# Patient Record
Sex: Female | Born: 1970 | Race: Black or African American | Hispanic: No | Marital: Single | Smoking: Never smoker
Health system: Southern US, Community
[De-identification: ages and names within clinical notes are randomized; demographics above are authoritative.]

## PROBLEM LIST (undated history)

## (undated) DIAGNOSIS — E559 Vitamin D deficiency, unspecified: Secondary | ICD-10-CM

## (undated) DIAGNOSIS — E739 Lactose intolerance, unspecified: Secondary | ICD-10-CM

## (undated) DIAGNOSIS — E1169 Type 2 diabetes mellitus with other specified complication: Secondary | ICD-10-CM

## (undated) DIAGNOSIS — N760 Acute vaginitis: Secondary | ICD-10-CM

## (undated) DIAGNOSIS — G4700 Insomnia, unspecified: Principal | ICD-10-CM

## (undated) DIAGNOSIS — I10 Essential (primary) hypertension: Secondary | ICD-10-CM

## (undated) DIAGNOSIS — E119 Type 2 diabetes mellitus without complications: Secondary | ICD-10-CM

## (undated) DIAGNOSIS — E669 Obesity, unspecified: Secondary | ICD-10-CM

## (undated) DIAGNOSIS — E785 Hyperlipidemia, unspecified: Secondary | ICD-10-CM

## (undated) DIAGNOSIS — K635 Polyp of colon: Secondary | ICD-10-CM

## (undated) DIAGNOSIS — G540 Brachial plexus disorders: Secondary | ICD-10-CM

## (undated) DIAGNOSIS — Z8719 Personal history of other diseases of the digestive system: Secondary | ICD-10-CM

## (undated) DIAGNOSIS — Z0000 Encounter for general adult medical examination without abnormal findings: Secondary | ICD-10-CM

## (undated) DIAGNOSIS — M7989 Other specified soft tissue disorders: Secondary | ICD-10-CM

## (undated) DIAGNOSIS — K589 Irritable bowel syndrome without diarrhea: Secondary | ICD-10-CM

## (undated) DIAGNOSIS — K219 Gastro-esophageal reflux disease without esophagitis: Secondary | ICD-10-CM

## (undated) DIAGNOSIS — G4730 Sleep apnea, unspecified: Secondary | ICD-10-CM

## (undated) DIAGNOSIS — M25529 Pain in unspecified elbow: Secondary | ICD-10-CM

## (undated) DIAGNOSIS — M1990 Unspecified osteoarthritis, unspecified site: Secondary | ICD-10-CM

## (undated) HISTORY — DX: Type 2 diabetes mellitus with other specified complication: E11.69

## (undated) HISTORY — DX: Irritable bowel syndrome, unspecified: K58.9

## (undated) HISTORY — DX: Acute vaginitis: N76.0

## (undated) HISTORY — DX: Insomnia, unspecified: G47.00

## (undated) HISTORY — DX: Pain in unspecified elbow: M25.529

## (undated) HISTORY — PX: COLONOSCOPY: SHX174

## (undated) HISTORY — DX: Morbid (severe) obesity due to excess calories: E66.01

## (undated) HISTORY — DX: Encounter for general adult medical examination without abnormal findings: Z00.00

## (undated) HISTORY — DX: Obesity, unspecified: E66.9

## (undated) HISTORY — DX: Type 2 diabetes mellitus without complications: E11.9

## (undated) HISTORY — DX: Gastro-esophageal reflux disease without esophagitis: K21.9

## (undated) HISTORY — DX: Personal history of other diseases of the digestive system: Z87.19

## (undated) HISTORY — DX: Hyperlipidemia, unspecified: E78.5

## (undated) HISTORY — DX: Sleep apnea, unspecified: G47.30

## (undated) HISTORY — DX: Other specified soft tissue disorders: M79.89

## (undated) HISTORY — DX: Lactose intolerance, unspecified: E73.9

## (undated) HISTORY — DX: Essential (primary) hypertension: I10

## (undated) HISTORY — PX: CHOLECYSTECTOMY: SHX55

## (undated) HISTORY — PX: MOLE REMOVAL: SHX2046

## (undated) HISTORY — DX: Polyp of colon: K63.5

## (undated) HISTORY — DX: Vitamin D deficiency, unspecified: E55.9

## (undated) HISTORY — PX: POLYPECTOMY: SHX149

## (undated) HISTORY — DX: Brachial plexus disorders: G54.0

## (undated) HISTORY — DX: Unspecified osteoarthritis, unspecified site: M19.90

---

## 1989-09-17 HISTORY — PX: GALLBLADDER SURGERY: SHX652

## 1999-09-18 HISTORY — PX: REDUCTION MAMMAPLASTY: SUR839

## 1999-09-18 HISTORY — PX: BREAST REDUCTION SURGERY: SHX8

## 2004-09-17 HISTORY — PX: CEREBRAL MICROVASCULAR DECOMPRESSION: SHX1328

## 2010-11-08 LAB — PULMONARY FUNCTION TEST

## 2011-09-26 ENCOUNTER — Ambulatory Visit (INDEPENDENT_AMBULATORY_CARE_PROVIDER_SITE_OTHER): Payer: BC Managed Care – PPO | Admitting: Internal Medicine

## 2011-09-26 ENCOUNTER — Ambulatory Visit (HOSPITAL_BASED_OUTPATIENT_CLINIC_OR_DEPARTMENT_OTHER)
Admission: RE | Admit: 2011-09-26 | Discharge: 2011-09-26 | Disposition: A | Discharge status: Home / Self Care | Payer: BC Managed Care – PPO | Source: Ambulatory Visit | Attending: Internal Medicine | Admitting: Internal Medicine

## 2011-09-26 ENCOUNTER — Other Ambulatory Visit: Payer: Self-pay | Admitting: Internal Medicine

## 2011-09-26 ENCOUNTER — Encounter: Payer: Self-pay | Admitting: Internal Medicine

## 2011-09-26 VITALS — BP 120/90 | HR 77 | Temp 98.0°F | Resp 20 | Ht 64.5 in | Wt 234.0 lb

## 2011-09-26 DIAGNOSIS — M25569 Pain in unspecified knee: Secondary | ICD-10-CM

## 2011-09-26 DIAGNOSIS — K219 Gastro-esophageal reflux disease without esophagitis: Secondary | ICD-10-CM

## 2011-09-26 DIAGNOSIS — R937 Abnormal findings on diagnostic imaging of other parts of musculoskeletal system: Secondary | ICD-10-CM

## 2011-09-26 DIAGNOSIS — M7989 Other specified soft tissue disorders: Secondary | ICD-10-CM

## 2011-09-26 DIAGNOSIS — G4733 Obstructive sleep apnea (adult) (pediatric): Secondary | ICD-10-CM

## 2011-09-26 DIAGNOSIS — E785 Hyperlipidemia, unspecified: Secondary | ICD-10-CM

## 2011-09-26 DIAGNOSIS — R609 Edema, unspecified: Secondary | ICD-10-CM | POA: Insufficient documentation

## 2011-09-26 DIAGNOSIS — M25559 Pain in unspecified hip: Secondary | ICD-10-CM | POA: Insufficient documentation

## 2011-09-26 MED ORDER — DICLOFENAC SODIUM 75 MG PO TBEC: DELAYED_RELEASE_TABLET | ORAL | Status: DC

## 2011-09-26 NOTE — Patient Instructions (Signed)
Please schedule fasting labs for tomorrow morning chem7-v58.69 and lipid/lft 272.4

## 2011-09-27 ENCOUNTER — Telehealth: Payer: Self-pay | Admitting: *Deleted

## 2011-09-27 DIAGNOSIS — E785 Hyperlipidemia, unspecified: Secondary | ICD-10-CM

## 2011-09-27 DIAGNOSIS — Z79899 Other long term (current) drug therapy: Secondary | ICD-10-CM

## 2011-09-27 LAB — LIPID PANEL
Cholesterol: 224 mg/dL — ABNORMAL HIGH (ref 0–200)
HDL: 40 mg/dL (ref >39)
LDL Cholesterol: 161 mg/dL — ABNORMAL HIGH (ref 0–99)
Triglycerides: 116 mg/dL (ref <150)

## 2011-09-27 LAB — HEPATIC FUNCTION PANEL
ALT: 22 U/L (ref 0–35)
Albumin: 4.5 g/dL (ref 3.5–5.2)
Indirect Bilirubin: 0.5 mg/dL (ref 0.0–0.9)
Total Protein: 7 g/dL (ref 6.0–8.3)

## 2011-09-27 LAB — BASIC METABOLIC PANEL
BUN: 10 mg/dL (ref 6–23)
CO2: 26 mEq/L (ref 19–32)
Calcium: 9.6 mg/dL (ref 8.4–10.5)
Chloride: 102 mEq/L (ref 96–112)
Creat: 0.94 mg/dL (ref 0.50–1.10)

## 2011-09-27 NOTE — Telephone Encounter (Signed)
Pt presented to the office for fasting labs. Order entered and forwarded to the lab.

## 2011-09-29 ENCOUNTER — Encounter: Payer: Self-pay | Admitting: Internal Medicine

## 2011-09-29 DIAGNOSIS — E782 Mixed hyperlipidemia: Secondary | ICD-10-CM | POA: Insufficient documentation

## 2011-09-29 DIAGNOSIS — G4733 Obstructive sleep apnea (adult) (pediatric): Secondary | ICD-10-CM | POA: Insufficient documentation

## 2011-09-29 DIAGNOSIS — K219 Gastro-esophageal reflux disease without esophagitis: Secondary | ICD-10-CM | POA: Insufficient documentation

## 2011-09-29 DIAGNOSIS — M25569 Pain in unspecified knee: Secondary | ICD-10-CM | POA: Insufficient documentation

## 2011-09-29 NOTE — Progress Notes (Signed)
  Subjective:    Patient ID: Chelsea Bray, female    DOB: 1971/06/16, 41 y.o.   MRN: [id number]  HPI Pt presents to clinic for evaluation of knee pain. Note chronic intermittent right knee pain without injury/trauma. Last year began to feel tight then has waxed and waned. Notes recent flare 12/31. No buckling or instability. Aleve helps. H/o hyperlipidemia tolerating vytorin without myalgias. GERD controlled with zegerd. No other complaints.   Past Medical History  Diagnosis Date  . Hyperlipidemia   . Hypertension    Past Surgical History  Procedure Date  . Gallbladder surgery 1991    gall stone removed  . Cerebral microvascular decompression 2006    back of her head  . Breast reduction surgery 2001    reports that she has never smoked. She has never used smokeless tobacco. She reports that she drinks alcohol. She reports that she does not use illicit drugs. family history includes Arrhythmia in her mother; Diabetes in her mother; Heart attack in her father; Heart disease in her maternal grandmother; Hypertension in an unspecified family member; Prostate cancer in her maternal grandfather; and Stroke in her father.  There is no history of Breast cancer. No Known Allergies   Review of Systems  Musculoskeletal: Positive for arthralgias. Negative for back pain, joint swelling and gait problem.  All other systems reviewed and are negative.       Objective:   Physical Exam  Nursing note and vitals reviewed. Constitutional: She appears well-developed and well-nourished. No distress.  HENT:  Head: Normocephalic and atraumatic.  Right Ear: External ear normal.  Left Ear: External ear normal.  Eyes: Conjunctivae are normal. No scleral icterus.  Neck: Neck supple.  Cardiovascular: Normal rate, regular rhythm and normal heart sounds.   Pulmonary/Chest: Effort normal and breath sounds normal. No respiratory distress. She has no wheezes. She has no rales.  Musculoskeletal:   Right knee:no erythema, warmth or effusion. +crepitus. Mild tenderness along medial ligament. FROM. Gait nl  Neurological: She is alert.  Skin: Skin is warm and dry. She is not diaphoretic.  Psychiatric: She has a normal mood and affect.          Assessment & Plan:

## 2011-09-29 NOTE — Assessment & Plan Note (Signed)
Obtain lipid/lft. 

## 2011-09-29 NOTE — Assessment & Plan Note (Signed)
Obtain plain xray. Attempt voltaren with food and no other nsaids. Followup if no improvement or worsening.  

## 2011-10-04 ENCOUNTER — Other Ambulatory Visit: Payer: Self-pay | Admitting: Internal Medicine

## 2011-10-04 DIAGNOSIS — R739 Hyperglycemia, unspecified: Secondary | ICD-10-CM

## 2011-10-04 DIAGNOSIS — E785 Hyperlipidemia, unspecified: Secondary | ICD-10-CM

## 2011-10-04 MED ORDER — ATORVASTATIN CALCIUM 10 MG PO TABS: 10.0000 mg | ORAL_TABLET | Freq: Every day | ORAL | Status: DC

## 2011-10-04 MED ORDER — ATORVASTATIN CALCIUM 10 MG PO TABS: 20.0000 mg | ORAL_TABLET | Freq: Every day | ORAL | Status: DC

## 2011-10-17 ENCOUNTER — Telehealth: Payer: Self-pay | Admitting: Internal Medicine

## 2011-10-17 NOTE — Telephone Encounter (Signed)
Received medical records from [facility] °

## 2011-11-08 ENCOUNTER — Ambulatory Visit: Payer: Self-pay | Admitting: Internal Medicine

## 2011-11-13 ENCOUNTER — Telehealth: Payer: Self-pay | Admitting: Internal Medicine

## 2011-11-13 ENCOUNTER — Ambulatory Visit (INDEPENDENT_AMBULATORY_CARE_PROVIDER_SITE_OTHER): Payer: BC Managed Care – PPO | Admitting: Internal Medicine

## 2011-11-13 ENCOUNTER — Encounter: Payer: Self-pay | Admitting: Internal Medicine

## 2011-11-13 VITALS — BP 122/80 | HR 74 | Temp 98.4°F | Resp 18 | Ht 64.5 in | Wt 235.0 lb

## 2011-11-13 DIAGNOSIS — E785 Hyperlipidemia, unspecified: Secondary | ICD-10-CM

## 2011-11-13 DIAGNOSIS — R7309 Other abnormal glucose: Secondary | ICD-10-CM

## 2011-11-13 DIAGNOSIS — R739 Hyperglycemia, unspecified: Secondary | ICD-10-CM

## 2011-11-13 DIAGNOSIS — M25569 Pain in unspecified knee: Secondary | ICD-10-CM

## 2011-11-13 DIAGNOSIS — M25561 Pain in right knee: Secondary | ICD-10-CM

## 2011-11-13 NOTE — Patient Instructions (Signed)
Please return soon fasting for your labwork.  Also please schedule lipid/lft 272.4 prior to your next visit

## 2011-11-14 ENCOUNTER — Encounter: Payer: Self-pay | Admitting: Family Medicine

## 2011-11-14 ENCOUNTER — Ambulatory Visit (INDEPENDENT_AMBULATORY_CARE_PROVIDER_SITE_OTHER): Payer: BC Managed Care – PPO | Admitting: Family Medicine

## 2011-11-14 VITALS — BP 128/93 | HR 98 | Temp 98.6°F | Ht 65.0 in | Wt 235.0 lb

## 2011-11-14 DIAGNOSIS — M549 Dorsalgia, unspecified: Secondary | ICD-10-CM

## 2011-11-14 DIAGNOSIS — M545 Low back pain: Secondary | ICD-10-CM

## 2011-11-14 MED ORDER — MELOXICAM 15 MG PO TABS: 15.0000 mg | ORAL_TABLET | Freq: Every day | ORAL | Status: DC

## 2011-11-14 MED ORDER — CYCLOBENZAPRINE HCL 10 MG PO TABS: 10.0000 mg | ORAL_TABLET | Freq: Three times a day (TID) | ORAL | Status: DC | PRN

## 2011-11-14 MED ORDER — DIAZEPAM 5 MG PO TABS: 5.0000 mg | ORAL_TABLET | Freq: Four times a day (QID) | ORAL | Status: AC | PRN

## 2011-11-14 MED ORDER — HYDROCODONE-ACETAMINOPHEN 5-500 MG PO TABS: 1.0000 | ORAL_TABLET | Freq: Four times a day (QID) | ORAL | Status: AC | PRN

## 2011-11-14 NOTE — Telephone Encounter (Signed)
Lab orders entered for July 2013. 

## 2011-11-14 NOTE — Patient Instructions (Signed)
Your pain is most indicative of severe lumbar strain/spasms though herniated disc is a possible second diagnosis. These are treated similarly initially. Meloxicam daily with food for pain and inflammation (if you do not have stomach or kidney issues). Can consider prednisone dose pack if not improving over next few days. Vicodin as needed for severe pain (no driving on this medicine). Valium (no driving on this) with transition to flexeril as needed for muscle spasms (no driving on this medicine if it makes you sleepy). Stay as active as possible. Start physical therapy for exercises, modalities. Do home exercises and stretches as directed on days you don't go to PT - hold each for 20-30 seconds and do each one three times. Consider massage, chiropractor, physical therapy, and/or acupuncture. Physical therapy has been shown to be helpful while the others have mixed results. Strengthening of low back muscles, abdominal musculature are key for long term pain relief. If not improving, will consider further imaging (x-rays, MRI) and/or other medications (neurontin, lyrica, nortriptyline) that help with pain. Out of work until Monday. Follow up with me in 2 weeks.

## 2011-11-15 ENCOUNTER — Ambulatory Visit: Payer: BC Managed Care – PPO | Admitting: Family Medicine

## 2011-11-16 ENCOUNTER — Encounter: Payer: Self-pay | Admitting: Family Medicine

## 2011-11-16 DIAGNOSIS — M549 Dorsalgia, unspecified: Secondary | ICD-10-CM | POA: Insufficient documentation

## 2011-11-16 NOTE — Assessment & Plan Note (Signed)
Likely due to lumbar strain/spasms, less likely disc herniation.  Start meloxicam, vicodin as needed for pain, valium for severe spasms (10 tabs given) with transition to flexeril.  Out of work until next Monday then she will attempt to go back.  Start physical therapy starting with modalities, stretches, eventually strengthening.  See instructions for further.  F/u in 2 weeks.

## 2011-11-16 NOTE — Progress Notes (Signed)
Subjective:    Patient ID: Chelsea Bray, female    DOB: 02/06/1971, 41 y.o.   MRN: [id number]  PCP: Dr. Rodena Medin  HPI 41 yo F initially set up to see me for bilateral knee pain but moved up for severe left sided back/hip pain.  Patient denies known injury. 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 Worsened that night to become very severe. Tried heating pad, diclofenac. + night pain. Had to call out of work due to pain. Had mild prior back issues but nothing this severe. No numbness/tingling. No bowel/bladder dysfunction. No radiation down leg past knee.  Past Medical History  Diagnosis Date  . Hyperlipidemia   . Hypertension     Current Outpatient Prescriptions on File Prior to Visit  Medication Sig Dispense Refill  . atorvastatin (LIPITOR) 10 MG tablet Take 2 tablets (20 mg total) by mouth daily.  30 tablet  6  . Cholecalciferol (VITAMIN D3) 50000 UNITS CAPS Take 50,000 mg by mouth once a week.        Past Surgical History  Procedure Date  . Gallbladder surgery 1991    gall stone removed  . Cerebral microvascular decompression 2006    back of her head  . Breast reduction surgery 2001    No Known Allergies  History   Social History  . Marital Status: Single    Spouse Name: N/A    Number of Children: N/A  . Years of Education: N/A   Occupational History  . Not on file.   Social History Main Topics  . Smoking status: Never Smoker   . Smokeless tobacco: Never Used  . Alcohol Use: Yes     wine ocassion  . Drug Use: No  . Sexually Active: Not on file   Other Topics Concern  . Not on file   Social History Narrative  . No narrative on file    Family History  Problem Relation Age of Onset  . Breast cancer Neg Hx   . Heart disease Maternal Grandmother   . Arrhythmia Mother   . Diabetes Mother     maternal grandmother and aunts  . Hypertension Mother   .  Heart attack Father   . Stroke Father   . Hyperlipidemia Father   . Hypertension Father   . Prostate cancer Maternal Grandfather   . Hypertension      parents and maternal parents    BP 128/93  Pulse 98  Temp(Src) 98.6 F (37 C) (Oral)  Ht 5\' 5"  (1.651 m)  Wt 235 lb (106.595 kg)  BMI 39.11 kg/m2  LMP 10/20/2011  Review of Systems See HPI above.    Objective:   Physical Exam Gen: NAD  Back: No gross deformity, scoliosis. TTP left lumbar paraspinal muscles, left buttock.  No midline or bony TTP. FROM but pain on extension > flexion. Strength LEs 5/5 all muscle groups.   2+ MSRs in patellar and achilles tendons, equal bilaterally. Negative SLRs. Sensation intact to light touch bilaterally. Negative logroll bilateral hips Negative fabers and piriformis stretches.    Assessment & Plan:  1. Low back pain - Likely due to lumbar strain/spasms, less likely disc herniation.  Start meloxicam, vicodin as needed for pain, valium for severe spasms (10 tabs given) with transition to flexeril.  Out of work until next Monday then she will attempt to go back.  Start physical therapy starting with modalities, stretches, eventually  strengthening.  See instructions for further.  F/u in 2 weeks.

## 2011-11-17 DIAGNOSIS — E1169 Type 2 diabetes mellitus with other specified complication: Secondary | ICD-10-CM | POA: Insufficient documentation

## 2011-11-17 DIAGNOSIS — E669 Obesity, unspecified: Secondary | ICD-10-CM | POA: Insufficient documentation

## 2011-11-17 DIAGNOSIS — E119 Type 2 diabetes mellitus without complications: Secondary | ICD-10-CM

## 2011-11-17 HISTORY — DX: Type 2 diabetes mellitus without complications: E11.9

## 2011-11-17 HISTORY — DX: Type 2 diabetes mellitus with other specified complication: E66.9

## 2011-11-17 HISTORY — DX: Type 2 diabetes mellitus with other specified complication: E11.69

## 2011-11-17 NOTE — Progress Notes (Signed)
  Subjective:    Patient ID: Chelsea Bray, female    DOB: 09-Jan-1971, 41 y.o.   MRN: [id number]  HPI Pt presents to clinic for followup of multiple medical problems. Continues to experience chronic knee pain that is interferring with daily activities. Now involves bilateral knees without erythema, warmth or effusion. Pain worse on standing. No help with voltaren. Tolerating statin tx without myalgias. lipitor more affordable than vytorin. No other complaints.  Past Medical History  Diagnosis Date  . Hyperlipidemia   . Hypertension    Past Surgical History  Procedure Date  . Gallbladder surgery 1991    gall stone removed  . Cerebral microvascular decompression 2006    back of her head  . Breast reduction surgery 2001    reports that she has never smoked. She has never used smokeless tobacco. She reports that she drinks alcohol. She reports that she does not use illicit drugs. family history includes Arrhythmia in her mother; Diabetes in her mother; Heart attack in her father; Heart disease in her maternal grandmother; Hyperlipidemia in her father; Hypertension in her father, mother, and unspecified family member; Prostate cancer in her maternal grandfather; and Stroke in her father.  There is no history of Breast cancer. No Known Allergies    Review of Systems see hpi     Objective:   Physical Exam  Physical Exam  Nursing note and vitals reviewed. Constitutional: Appears well-developed and well-nourished. No distress.  HENT:  Head: Normocephalic and atraumatic.  Right Ear: External ear normal.  Left Ear: External ear normal.  Eyes: Conjunctivae are normal. No scleral icterus.  Neck: Neck supple. Carotid bruit is not present.  Cardiovascular: Normal rate, regular rhythm and normal heart sounds.  Exam reveals no gallop and no friction rub.   No murmur heard. Pulmonary/Chest: Effort normal and breath sounds normal. No respiratory distress. He has no wheezes. no rales.    Lymphadenopathy:    He has no cervical adenopathy.  Neurological:Alert.  Skin: Skin is warm and dry. Not diaphoretic.  Psychiatric: Has a normal mood and affect.        Assessment & Plan:

## 2011-11-17 NOTE — Assessment & Plan Note (Signed)
Obtain a1c.  

## 2011-11-17 NOTE — Assessment & Plan Note (Signed)
S/p change to lipitor. Obtain lipid/lft.

## 2011-11-17 NOTE — Assessment & Plan Note (Signed)
Sports medicine consult

## 2011-11-19 ENCOUNTER — Ambulatory Visit: Payer: BC Managed Care – PPO | Attending: Family Medicine | Admitting: Physical Therapy

## 2011-11-19 DIAGNOSIS — M545 Low back pain, unspecified: Secondary | ICD-10-CM | POA: Insufficient documentation

## 2011-11-19 DIAGNOSIS — IMO0001 Reserved for inherently not codable concepts without codable children: Secondary | ICD-10-CM | POA: Insufficient documentation

## 2011-11-21 ENCOUNTER — Ambulatory Visit: Payer: BC Managed Care – PPO

## 2011-11-27 ENCOUNTER — Ambulatory Visit: Payer: BC Managed Care – PPO | Admitting: Rehabilitation

## 2011-11-29 ENCOUNTER — Ambulatory Visit: Payer: BC Managed Care – PPO | Admitting: Family Medicine

## 2011-11-29 ENCOUNTER — Ambulatory Visit: Payer: BC Managed Care – PPO | Admitting: Physical Therapy

## 2011-12-05 ENCOUNTER — Ambulatory Visit: Payer: BC Managed Care – PPO | Admitting: Rehabilitation

## 2011-12-11 ENCOUNTER — Ambulatory Visit: Payer: BC Managed Care – PPO | Admitting: Rehabilitation

## 2011-12-12 ENCOUNTER — Ambulatory Visit: Payer: BC Managed Care – PPO | Admitting: Family Medicine

## 2011-12-12 ENCOUNTER — Ambulatory Visit (INDEPENDENT_AMBULATORY_CARE_PROVIDER_SITE_OTHER): Payer: BC Managed Care – PPO | Admitting: Internal Medicine

## 2011-12-12 ENCOUNTER — Encounter: Payer: Self-pay | Admitting: Internal Medicine

## 2011-12-12 VITALS — BP 126/92 | HR 88 | Temp 98.2°F | Resp 18 | Wt 233.0 lb

## 2011-12-12 DIAGNOSIS — M792 Neuralgia and neuritis, unspecified: Secondary | ICD-10-CM

## 2011-12-12 DIAGNOSIS — N390 Urinary tract infection, site not specified: Secondary | ICD-10-CM

## 2011-12-12 DIAGNOSIS — IMO0002 Reserved for concepts with insufficient information to code with codable children: Secondary | ICD-10-CM

## 2011-12-12 DIAGNOSIS — R300 Dysuria: Secondary | ICD-10-CM

## 2011-12-12 LAB — POCT URINALYSIS DIPSTICK
Bilirubin, UA: NEGATIVE
Glucose, UA: NEGATIVE
Ketones, UA: NEGATIVE
Nitrite, UA: NEGATIVE
Spec Grav, UA: 1.02
pH, UA: 6

## 2011-12-12 MED ORDER — CIPROFLOXACIN HCL 500 MG PO TABS: 500.0000 mg | ORAL_TABLET | Freq: Two times a day (BID) | ORAL | Status: DC

## 2011-12-12 MED ORDER — CIPROFLOXACIN HCL 500 MG PO TABS: 500.0000 mg | ORAL_TABLET | Freq: Two times a day (BID) | ORAL | Status: AC

## 2011-12-12 NOTE — Assessment & Plan Note (Signed)
Attempt 5-6d course of motrin. Consider resuming neurontin if no improvement.

## 2011-12-12 NOTE — Progress Notes (Signed)
  Subjective:    Patient ID: Chelsea Bray, female    DOB: 1970-09-30, 41 y.o.   MRN: [id number]  HPI Pt presents to clinic for evaluation of possible uti. Notes 2wk h/o dysuria and urinary frequency. Denies f/c, n/v, back pain or hematuria. Took azo x one without improvement. No other alleviating or exacerbating factors. Also notes chronic intermittent right sided head pain-sometimes involves numb sensation. H/o neck problems. Took motrin prn for 1-2 days without improvement. Has neurontin at home that she previously took.   Past Medical History  Diagnosis Date  . Hyperlipidemia   . Hypertension    Past Surgical History  Procedure Date  . Gallbladder surgery 1991    gall stone removed  . Cerebral microvascular decompression 2006    back of her head  . Breast reduction surgery 2001    reports that she has never smoked. She has never used smokeless tobacco. She reports that she drinks alcohol. She reports that she does not use illicit drugs. family history includes Arrhythmia in her mother; Diabetes in her mother; Heart attack in her father; Heart disease in her maternal grandmother; Hyperlipidemia in her father; Hypertension in her father, mother, and unspecified family member; Prostate cancer in her maternal grandfather; and Stroke in her father.  There is no history of Breast cancer. No Known Allergies   Review of Systems see hpi     Objective:   Physical Exam  Nursing note and vitals reviewed. Constitutional: She appears well-developed and well-nourished. No distress.  HENT:  Head: Normocephalic and atraumatic.  Abdominal: Soft. Bowel sounds are normal. She exhibits no distension. There is no tenderness.  Musculoskeletal:       No cva tenderness  Neurological: She is alert.  Skin: She is not diaphoretic.  Psychiatric: She has a normal mood and affect.          Assessment & Plan:

## 2011-12-12 NOTE — Assessment & Plan Note (Signed)
Begin course of cipro. Followup if no improvement or worsening.  

## 2011-12-13 ENCOUNTER — Ambulatory Visit: Payer: BC Managed Care – PPO | Admitting: Family Medicine

## 2011-12-13 ENCOUNTER — Encounter: Payer: BC Managed Care – PPO | Admitting: Physical Therapy

## 2011-12-25 ENCOUNTER — Ambulatory Visit: Payer: BC Managed Care – PPO | Attending: Family Medicine | Admitting: Physical Therapy

## 2011-12-25 DIAGNOSIS — IMO0001 Reserved for inherently not codable concepts without codable children: Secondary | ICD-10-CM | POA: Insufficient documentation

## 2011-12-25 DIAGNOSIS — M545 Low back pain, unspecified: Secondary | ICD-10-CM | POA: Insufficient documentation

## 2012-01-04 ENCOUNTER — Ambulatory Visit: Payer: BC Managed Care – PPO | Admitting: Physical Therapy

## 2012-01-10 ENCOUNTER — Ambulatory Visit: Payer: BC Managed Care – PPO | Admitting: Rehabilitation

## 2012-01-11 ENCOUNTER — Ambulatory Visit: Payer: BC Managed Care – PPO | Admitting: Rehabilitation

## 2012-01-14 ENCOUNTER — Ambulatory Visit: Payer: BC Managed Care – PPO | Admitting: Rehabilitation

## 2012-01-16 ENCOUNTER — Ambulatory Visit: Payer: BC Managed Care – PPO | Attending: Family Medicine | Admitting: Physical Therapy

## 2012-01-16 DIAGNOSIS — IMO0001 Reserved for inherently not codable concepts without codable children: Secondary | ICD-10-CM | POA: Insufficient documentation

## 2012-01-16 DIAGNOSIS — M545 Low back pain, unspecified: Secondary | ICD-10-CM | POA: Insufficient documentation

## 2012-01-21 ENCOUNTER — Encounter: Payer: BC Managed Care – PPO | Admitting: Rehabilitation

## 2012-01-23 ENCOUNTER — Ambulatory Visit: Payer: BC Managed Care – PPO | Admitting: Rehabilitation

## 2012-01-28 ENCOUNTER — Ambulatory Visit: Payer: BC Managed Care – PPO | Admitting: Rehabilitation

## 2012-01-30 ENCOUNTER — Encounter: Payer: BC Managed Care – PPO | Admitting: Physical Therapy

## 2012-02-04 ENCOUNTER — Ambulatory Visit: Payer: BC Managed Care – PPO | Admitting: Rehabilitation

## 2012-02-06 ENCOUNTER — Ambulatory Visit: Payer: BC Managed Care – PPO | Admitting: Physical Therapy

## 2012-02-13 ENCOUNTER — Ambulatory Visit: Payer: BC Managed Care – PPO | Admitting: Rehabilitation

## 2012-02-15 ENCOUNTER — Ambulatory Visit: Payer: BC Managed Care – PPO | Admitting: Physical Therapy

## 2012-03-12 ENCOUNTER — Ambulatory Visit: Payer: BC Managed Care – PPO | Attending: Family Medicine | Admitting: Physical Therapy

## 2012-03-12 DIAGNOSIS — M545 Low back pain, unspecified: Secondary | ICD-10-CM | POA: Insufficient documentation

## 2012-03-12 DIAGNOSIS — IMO0001 Reserved for inherently not codable concepts without codable children: Secondary | ICD-10-CM | POA: Insufficient documentation

## 2012-04-08 ENCOUNTER — Ambulatory Visit: Payer: BC Managed Care – PPO | Admitting: Internal Medicine

## 2012-04-21 ENCOUNTER — Ambulatory Visit: Payer: BC Managed Care – PPO | Admitting: Internal Medicine

## 2012-06-13 ENCOUNTER — Ambulatory Visit (INDEPENDENT_AMBULATORY_CARE_PROVIDER_SITE_OTHER): Payer: BC Managed Care – PPO | Admitting: Internal Medicine

## 2012-06-13 ENCOUNTER — Encounter: Payer: Self-pay | Admitting: Internal Medicine

## 2012-06-13 VITALS — BP 124/90 | HR 69 | Temp 97.8°F | Resp 16 | Ht 65.0 in | Wt 239.0 lb

## 2012-06-13 DIAGNOSIS — Z1239 Encounter for other screening for malignant neoplasm of breast: Secondary | ICD-10-CM

## 2012-06-13 DIAGNOSIS — Z23 Encounter for immunization: Secondary | ICD-10-CM

## 2012-06-13 DIAGNOSIS — E785 Hyperlipidemia, unspecified: Secondary | ICD-10-CM

## 2012-06-13 DIAGNOSIS — Z0000 Encounter for general adult medical examination without abnormal findings: Secondary | ICD-10-CM

## 2012-06-13 DIAGNOSIS — I10 Essential (primary) hypertension: Secondary | ICD-10-CM | POA: Insufficient documentation

## 2012-06-13 LAB — CBC WITH DIFFERENTIAL/PLATELET
Basophils Absolute: 0 10*3/uL (ref 0.0–0.1)
Eosinophils Absolute: 0.1 10*3/uL (ref 0.0–0.7)
Eosinophils Relative: 1 % (ref 0–5)
Lymphocytes Relative: 28 % (ref 12–46)
MCV: 90.1 fL (ref 78.0–100.0)
Neutrophils Relative %: 65 % (ref 43–77)
Platelets: 350 10*3/uL (ref 150–400)
RBC: 4.46 MIL/uL (ref 3.87–5.11)
RDW: 14 % (ref 11.5–15.5)
WBC: 7.2 10*3/uL (ref 4.0–10.5)

## 2012-06-13 LAB — BASIC METABOLIC PANEL
Calcium: 9.6 mg/dL (ref 8.4–10.5)
Chloride: 105 mEq/L (ref 96–112)
Creat: 0.87 mg/dL (ref 0.50–1.10)
Sodium: 142 mEq/L (ref 135–145)

## 2012-06-13 LAB — HEPATIC FUNCTION PANEL
AST: 15 U/L (ref 0–37)
Albumin: 4.5 g/dL (ref 3.5–5.2)
Alkaline Phosphatase: 57 U/L (ref 39–117)
Total Protein: 7.1 g/dL (ref 6.0–8.3)

## 2012-06-13 LAB — LIPID PANEL
HDL: 39 mg/dL — ABNORMAL LOW (ref >39)
Total CHOL/HDL Ratio: 4.4 Ratio
Triglycerides: 112 mg/dL (ref <150)

## 2012-06-13 MED ORDER — HYDROCHLOROTHIAZIDE 12.5 MG PO TABS: 12.5000 mg | ORAL_TABLET | Freq: Every day | ORAL | Status: DC

## 2012-06-13 MED ORDER — VITAMIN D3 1.25 MG (50000 UT) PO CAPS: 50000.0000 mg | ORAL_CAPSULE | ORAL | Status: DC

## 2012-06-13 NOTE — Assessment & Plan Note (Signed)
Normal exam. Schedule mammogram. Recommend Pap smear next year. Administer influenza vaccine. Obtain CPE labs. Encouraged followup with gastroenterology for repeat colonoscopy.

## 2012-06-13 NOTE — Assessment & Plan Note (Signed)
Encouraged weight loss and regular exercise as well sodium or stricture. Begin hydrochlorothiazide 12. 5 mg daily. Followup in two months or sooner if necessary. Recommend outpatient blood pressure monitoring as well

## 2012-06-13 NOTE — Progress Notes (Signed)
  Subjective:    Patient ID: Chelsea Bray, female    DOB: 17-Aug-1971, 41 y.o.   MRN: [id number]  HPI Pt presents to clinic for annual exam. Notes mild swelling of bilateral lower legs without shortness of breath. States blood pressure diastolic is consistently approximately 90 or mildly above. Has only been taking Lipitor 10 mg a day. Has never undergone mammography. Outside gastroenterology has recommended repeat colonoscopy this year the last being performed two thousand twelve. Apparently had multiple polyps and a possible incomplete exam. Last Pap smear approximately 1.5years ago and all have been normal.  Past Medical History  Diagnosis Date  . Hyperlipidemia   . Hypertension    Past Surgical History  Procedure Date  . Gallbladder surgery 1991    gall stone removed  . Cerebral microvascular decompression 2006    back of her head  . Breast reduction surgery 2001    reports that she has never smoked. She has never used smokeless tobacco. She reports that she drinks alcohol. She reports that she does not use illicit drugs. family history includes Arrhythmia in her mother; Diabetes in her mother; Heart attack in her father; Heart disease in her maternal grandmother; Hyperlipidemia in her father; Hypertension in her father, mother, and unspecified family member; Prostate cancer in her maternal grandfather; and Stroke in her father.  There is no history of Breast cancer. No Known Allergies   Review of Systems see hpi     Objective:   Physical Exam  Nursing note and vitals reviewed. Constitutional: She appears well-developed and well-nourished. No distress.  HENT:  Head: Normocephalic and atraumatic.  Right Ear: Tympanic membrane, external ear and ear canal normal.  Left Ear: Tympanic membrane, external ear and ear canal normal.  Nose: Nose normal.  Mouth/Throat: Oropharynx is clear and moist. No oropharyngeal exudate.  Eyes: Conjunctivae normal and EOM are normal. Pupils are  equal, round, and reactive to light. No scleral icterus.  Neck: Neck supple. No thyromegaly present.  Cardiovascular: Normal rate, regular rhythm and normal heart sounds.  Exam reveals no gallop and no friction rub.   No murmur heard. Pulmonary/Chest: Effort normal and breath sounds normal. No respiratory distress. She has no wheezes. She has no rales.  Abdominal: Soft. Normal appearance and bowel sounds are normal. She exhibits no distension and no mass. There is no hepatosplenomegaly. There is no tenderness. There is no rebound and no guarding.  Lymphadenopathy:    She has no cervical adenopathy.  Neurological: She is alert.  Skin: Skin is warm and dry. She is not diaphoretic.       Extremities trace-plus one bilateral extremity edema. No calf swelling or cords palpable  Psychiatric: She has a normal mood and affect.          Assessment & Plan:

## 2012-06-14 LAB — URINALYSIS, ROUTINE W REFLEX MICROSCOPIC
Bilirubin Urine: NEGATIVE
Glucose, UA: NEGATIVE mg/dL
Hgb urine dipstick: NEGATIVE
Ketones, ur: NEGATIVE mg/dL
Protein, ur: NEGATIVE mg/dL

## 2012-06-20 ENCOUNTER — Telehealth: Payer: Self-pay | Admitting: *Deleted

## 2012-06-20 DIAGNOSIS — R739 Hyperglycemia, unspecified: Secondary | ICD-10-CM

## 2012-06-20 DIAGNOSIS — E559 Vitamin D deficiency, unspecified: Secondary | ICD-10-CM

## 2012-06-20 NOTE — Telephone Encounter (Signed)
Message copied by Regis Bill on Fri Jun 20, 2012  4:41 PM ------      Message from: Edwyna Perfect      Created: Wed Jun 18, 2012  8:12 PM       Chol better. Vit d low. Change vit d to otc 3000 units a day. Return to lab 6-8 weeks for vit d-vit d deficiency and a1c-hyperglycemia

## 2012-06-20 NOTE — Telephone Encounter (Signed)
Patient informed, understood & agreed; future lab orders placed/SLS 

## 2012-06-23 ENCOUNTER — Ambulatory Visit (HOSPITAL_BASED_OUTPATIENT_CLINIC_OR_DEPARTMENT_OTHER)
Admission: RE | Admit: 2012-06-23 | Discharge: 2012-06-23 | Disposition: A | Discharge status: Home / Self Care | Payer: BC Managed Care – PPO | Source: Ambulatory Visit | Attending: Internal Medicine | Admitting: Internal Medicine

## 2012-06-23 DIAGNOSIS — Z1231 Encounter for screening mammogram for malignant neoplasm of breast: Secondary | ICD-10-CM | POA: Insufficient documentation

## 2012-06-23 DIAGNOSIS — Z1239 Encounter for other screening for malignant neoplasm of breast: Secondary | ICD-10-CM

## 2012-06-23 DIAGNOSIS — R922 Inconclusive mammogram: Secondary | ICD-10-CM | POA: Insufficient documentation

## 2012-06-25 ENCOUNTER — Other Ambulatory Visit: Payer: Self-pay | Admitting: Internal Medicine

## 2012-06-25 DIAGNOSIS — R928 Other abnormal and inconclusive findings on diagnostic imaging of breast: Secondary | ICD-10-CM

## 2012-06-27 ENCOUNTER — Other Ambulatory Visit: Payer: BC Managed Care – PPO

## 2012-07-01 ENCOUNTER — Other Ambulatory Visit: Payer: BC Managed Care – PPO

## 2012-07-04 ENCOUNTER — Ambulatory Visit
Admission: RE | Admit: 2012-07-04 | Discharge: 2012-07-04 | Disposition: A | Discharge status: Home / Self Care | Payer: BC Managed Care – PPO | Source: Ambulatory Visit | Attending: Internal Medicine | Admitting: Internal Medicine

## 2012-07-04 DIAGNOSIS — R928 Other abnormal and inconclusive findings on diagnostic imaging of breast: Secondary | ICD-10-CM

## 2012-08-13 ENCOUNTER — Ambulatory Visit (INDEPENDENT_AMBULATORY_CARE_PROVIDER_SITE_OTHER): Payer: BC Managed Care – PPO | Admitting: Internal Medicine

## 2012-08-13 ENCOUNTER — Encounter: Payer: Self-pay | Admitting: Internal Medicine

## 2012-08-13 ENCOUNTER — Ambulatory Visit: Payer: BC Managed Care – PPO | Admitting: Internal Medicine

## 2012-08-13 VITALS — BP 122/92 | HR 77 | Temp 98.2°F | Resp 16 | Wt 237.5 lb

## 2012-08-13 DIAGNOSIS — G8929 Other chronic pain: Secondary | ICD-10-CM

## 2012-08-13 DIAGNOSIS — M25569 Pain in unspecified knee: Secondary | ICD-10-CM

## 2012-08-13 DIAGNOSIS — H6980 Other specified disorders of Eustachian tube, unspecified ear: Secondary | ICD-10-CM | POA: Insufficient documentation

## 2012-08-13 DIAGNOSIS — R7309 Other abnormal glucose: Secondary | ICD-10-CM

## 2012-08-13 DIAGNOSIS — R739 Hyperglycemia, unspecified: Secondary | ICD-10-CM

## 2012-08-13 DIAGNOSIS — H6990 Unspecified Eustachian tube disorder, unspecified ear: Secondary | ICD-10-CM | POA: Insufficient documentation

## 2012-08-13 DIAGNOSIS — I10 Essential (primary) hypertension: Secondary | ICD-10-CM

## 2012-08-13 DIAGNOSIS — E559 Vitamin D deficiency, unspecified: Secondary | ICD-10-CM

## 2012-08-13 LAB — HEMOGLOBIN A1C: Mean Plasma Glucose: 140 mg/dL — ABNORMAL HIGH (ref <117)

## 2012-08-13 MED ORDER — FLUTICASONE PROPIONATE 50 MCG/ACT NA SUSP: 2.0000 | Freq: Every day | NASAL | Status: DC

## 2012-08-13 MED ORDER — HYDROCHLOROTHIAZIDE 12.5 MG PO TABS: 12.5000 mg | ORAL_TABLET | Freq: Every day | ORAL | Status: DC

## 2012-08-13 NOTE — Assessment & Plan Note (Signed)
Begin hctz qd. Monitor bp as outpt.

## 2012-08-13 NOTE — Assessment & Plan Note (Signed)
Attempt flonase and AH. Followup if no improvement or worsening.

## 2012-08-13 NOTE — Progress Notes (Signed)
  Subjective:    Patient ID: [name], female    DOB: 1971/02/01, 41 y.o.   MRN: [id number]  HPI Pt presents to clinic for followup of multiple medical problems. Did not begin hctz. BP reviewed persistently mildly elevated. Reviewed mild elevation of glucose and low vitamin d. Resumed vit d supplementation ~93month ago. Notes recent left neck to possibly ear with associated nasal congestion and drainage. Right knee continues to hurt and intermittently swell. S/p PT and mobic. Notes intermittent left popliteal pain/tenderness. Denies unilateral leg swelling, cp, dyspnea or risk factors for VTE.   Past Medical History  Diagnosis Date  . Hyperlipidemia   . Hypertension    Past Surgical History  Procedure Date  . Gallbladder surgery 1991    gall stone removed  . Cerebral microvascular decompression 2006    back of her head  . Breast reduction surgery 2001    reports that she has never smoked. She has never used smokeless tobacco. She reports that she drinks alcohol. She reports that she does not use illicit drugs. family history includes Arrhythmia in her mother; Diabetes in her mother; Heart attack in her father; Heart disease in her maternal grandmother; Hyperlipidemia in her father; Hypertension in her father, mother, and unspecified family member; Prostate cancer in her maternal grandfather; and Stroke in her father.  There is no history of Breast cancer. No Known Allergies    Review of Systems see hpi     Objective:   Physical Exam  Nursing note and vitals reviewed. Constitutional: She appears well-developed and well-nourished. No distress.  HENT:  Head: Normocephalic and atraumatic.  Right Ear: External ear normal.  Left Ear: External ear normal.  Nose: Nose normal.  Mouth/Throat: Oropharynx is clear and moist. No oropharyngeal exudate.  Eyes: Conjunctivae normal are normal.  Neck: Neck supple.  Musculoskeletal:       Right knee +crepitus. Left knee-minimal  crepitus.  WUJ:WJXBJ bilateral LE swelling. No palpable cords.  Lymphadenopathy:    She has no cervical adenopathy.  Neurological: She is alert.  Skin: She is not diaphoretic.  Psychiatric: She has a normal mood and affect.         Assessment & Plan:

## 2012-08-13 NOTE — Assessment & Plan Note (Signed)
Proceed with orthopedic consult

## 2012-08-14 ENCOUNTER — Emergency Department (HOSPITAL_BASED_OUTPATIENT_CLINIC_OR_DEPARTMENT_OTHER)
Admission: EM | Admit: 2012-08-14 | Discharge: 2012-08-14 | Disposition: A | Discharge status: Home / Self Care | Payer: BC Managed Care – PPO | Attending: Emergency Medicine | Admitting: Emergency Medicine

## 2012-08-14 ENCOUNTER — Encounter (HOSPITAL_BASED_OUTPATIENT_CLINIC_OR_DEPARTMENT_OTHER): Payer: Self-pay | Admitting: *Deleted

## 2012-08-14 DIAGNOSIS — Z79899 Other long term (current) drug therapy: Secondary | ICD-10-CM | POA: Insufficient documentation

## 2012-08-14 DIAGNOSIS — Z5189 Encounter for other specified aftercare: Secondary | ICD-10-CM

## 2012-08-14 DIAGNOSIS — T8131XA Disruption of external operation (surgical) wound, not elsewhere classified, initial encounter: Secondary | ICD-10-CM | POA: Insufficient documentation

## 2012-08-14 DIAGNOSIS — E785 Hyperlipidemia, unspecified: Secondary | ICD-10-CM | POA: Insufficient documentation

## 2012-08-14 DIAGNOSIS — I10 Essential (primary) hypertension: Secondary | ICD-10-CM | POA: Insufficient documentation

## 2012-08-14 DIAGNOSIS — Y838 Other surgical procedures as the cause of abnormal reaction of the patient, or of later complication, without mention of misadventure at the time of the procedure: Secondary | ICD-10-CM | POA: Insufficient documentation

## 2012-08-14 LAB — VITAMIN D 25 HYDROXY (VIT D DEFICIENCY, FRACTURES): Vit D, 25-Hydroxy: 44 ng/mL (ref 30–89)

## 2012-08-14 NOTE — ED Provider Notes (Signed)
History     CSN: [id number]  Arrival date & time 08/14/12  1958   First MD Initiated Contact with Patient 08/14/12 2028      Chief Complaint  Patient presents with  . Wound Check    (Consider location/radiation/quality/duration/timing/severity/associated sxs/prior treatment) HPI Comments: [name] presents ambulatory for evaluation.  She had stiches removed by Dr. Sunday Shams (plastic surgeon -  [place], [place]) yesterday.  She noticed that it appeared that the wound has opened wider since then.  She reports some mild spotting drainage .  She denies any swelling, redness, or pain at the site.  She denies any localized trauma.  The history is provided by the patient. No language interpreter was used.    Past Medical History  Diagnosis Date  . Hyperlipidemia   . Hypertension     Past Surgical History  Procedure Date  . Gallbladder surgery 1991    gall stone removed  . Cerebral microvascular decompression 2006    back of her head  . Breast reduction surgery 2001  . Cholecystectomy   . Mole removal     Family History  Problem Relation Age of Onset  . Breast cancer Neg Hx   . Heart disease Maternal Grandmother   . Arrhythmia Mother   . Diabetes Mother     maternal grandmother and aunts  . Hypertension Mother   . Heart attack Father   . Stroke Father   . Hyperlipidemia Father   . Hypertension Father   . Prostate cancer Maternal Grandfather   . Hypertension      parents and maternal parents    History  Substance Use Topics  . Smoking status: Never Smoker   . Smokeless tobacco: Never Used  . Alcohol Use: Yes     Comment: wine ocassion    OB History    Grav Para Term Preterm Abortions TAB SAB Ect Mult Living                  Review of Systems  All other systems reviewed and are negative.    Allergies  Review of patient's allergies indicates no known allergies.  Home Medications   Current Outpatient Rx  Name  Route  Sig  Dispense  Refill  .  ATORVASTATIN CALCIUM 10 MG PO TABS   Oral   Take 10 mg by mouth daily.         Marland Kitchen VITAMIN D3 50000 UNITS PO CAPS   Oral   Take 50,000 mg by mouth once a week.   5 capsule   5   . CYCLOBENZAPRINE HCL 10 MG PO TABS   Oral   Take 1 tablet (10 mg total) by mouth every 8 (eight) hours as needed for muscle spasms. Start AFTER finishing valium.   60 tablet   1   . FLUTICASONE PROPIONATE 50 MCG/ACT NA SUSP   Nasal   Place 2 sprays into the nose daily.   16 g   6   . HYDROCHLOROTHIAZIDE 12.5 MG PO TABS   Oral   Take 1 tablet (12.5 mg total) by mouth daily.   30 tablet   6   . MELOXICAM 15 MG PO TABS   Oral   Take 15 mg by mouth daily as needed. With food.         Marland Kitchen METOCLOPRAMIDE HCL 5 MG PO TABS   Oral   Take 5 mg by mouth 2 (two) times daily.         Marland Kitchen  OMEPRAZOLE 40 MG PO CPDR   Oral   Take 40 mg by mouth 2 (two) times daily.           BP 147/85  Pulse 79  Temp 98 F (36.7 C) (Oral)  Resp 20  Ht 5\' 5"  (1.651 m)  Wt 237 lb (107.502 kg)  BMI 39.44 kg/m2  SpO2 100%  LMP 07/16/2012  Physical Exam  Nursing note and vitals reviewed. Constitutional: She appears well-developed and well-nourished. No distress.  Cardiovascular: Normal rate, regular rhythm, normal heart sounds and intact distal pulses.  Exam reveals no gallop and no friction rub.   No murmur heard. Pulmonary/Chest: Effort normal and breath sounds normal. No respiratory distress. She has no wheezes. She has no rales. She exhibits no tenderness.  Abdominal: Soft. Bowel sounds are normal. She exhibits no distension and no mass. There is no tenderness. There is no rebound and no guarding.  Skin: Skin is warm and dry. No rash noted. She is not diaphoretic. No erythema. No pallor.       note a 2.5 cm healing wound on her right upper, inner ar,  There are scars/indentations from recently removed sutures and some dehiscence of the incision site.  The base of the wound is clearly visible and covered in  well-formed granulation tissue.  There is no active drainage and no localized erythema, underlying fluctuance , or swelling.  Psychiatric: She has a normal mood and affect. Her behavior is normal.    ED Course  Procedures (including critical care time)  Labs Reviewed - No data to display No results found.   No diagnosis found.    MDM  Pt presents for evaluation of a wound 24 hours after the sutures were removed by her surgeon.  The wound appears clean and dry but there has been some dehiscence of the wound.  I have informed her that it can not be closed primarily but will have to heal from the base and margins upward and inward.  Discussed localized wound care and indications for immediate return to her surgeon or the ER.  Plan discharge home.        Tobin Chad, MD 08/14/12 2101

## 2012-08-14 NOTE — ED Notes (Signed)
Pt reports she had mole removed and stitches were removed yesterday- today in shower pt noticed wound had re-opened

## 2012-08-14 NOTE — ED Notes (Signed)
Placed bandaid on Pt. Wound area before she dressed to leave facility.

## 2012-08-20 ENCOUNTER — Telehealth: Payer: Self-pay | Admitting: *Deleted

## 2012-08-20 NOTE — Telephone Encounter (Signed)
Message copied by Regis Bill on Wed Aug 20, 2012  2:13 PM ------      Message from: Edwyna Perfect      Created: Mon Aug 18, 2012  5:49 PM       1) vitamin d now nl      2) sugar avg 6.5 consistent with mild diabetes. Low sugar/carb diet. Offer nutrition consult. Needs glucometer for fasting fsbs 3x/wee. Report results after 3wks.

## 2012-08-20 NOTE — Telephone Encounter (Signed)
LMOM with contact name & number for return call RE: results & provider instructions/SLS

## 2012-08-21 NOTE — Telephone Encounter (Signed)
Patient informed, understood & agreed, will p/u glucometer tomorrow & we will do train session in office/SLS

## 2012-09-02 ENCOUNTER — Encounter: Payer: Self-pay | Admitting: Family

## 2012-09-02 ENCOUNTER — Ambulatory Visit (INDEPENDENT_AMBULATORY_CARE_PROVIDER_SITE_OTHER): Payer: BC Managed Care – PPO | Admitting: Family

## 2012-09-02 ENCOUNTER — Telehealth: Payer: Self-pay | Admitting: Internal Medicine

## 2012-09-02 VITALS — BP 104/74 | HR 62 | Temp 99.0°F | Resp 16 | Wt 233.0 lb

## 2012-09-02 DIAGNOSIS — IMO0002 Reserved for concepts with insufficient information to code with codable children: Secondary | ICD-10-CM

## 2012-09-02 DIAGNOSIS — M792 Neuralgia and neuritis, unspecified: Secondary | ICD-10-CM

## 2012-09-02 MED ORDER — GABAPENTIN 100 MG PO CAPS: 100.0000 mg | ORAL_CAPSULE | Freq: Three times a day (TID) | ORAL | Status: DC

## 2012-09-02 NOTE — Patient Instructions (Addendum)
Please call if your ear pain worsens or if it does not improve.  Follow up with Dr. Rodena Medin in January as scheduled.

## 2012-09-02 NOTE — Telephone Encounter (Signed)
Refill gabapentin 100 mg capsules take 1 capsule by mouth three times daily qty 270 last fill not shown

## 2012-09-02 NOTE — Progress Notes (Signed)
Subjective:    Patient ID: Chelsea Bray, female    DOB: 26-Nov-1970, 41 y.o.   MRN: [id number]  HPI  [name] is a 41 yr old female who presents today with chief complaint of right ear pain. Pain started Friday Evening.  She reports that pain started deep in the ear and now radiating behind the ear. Reports scalp on right behind the ear hurts. She describes intermittent sharp shooting pains. Hurts to swallow on the right.  Initially had some sinus drainage.  She was placed on flonase and reports which seemed to help.  She denies fever.  She tried tylenol for ear pain which did not help.     Review of Systems    see HPI  Past Medical History  Diagnosis Date  . Hyperlipidemia   . Hypertension     History   Social History  . Marital Status: Married    Spouse Name: N/A    Number of Children: N/A  . Years of Education: N/A   Occupational History  . Not on file.   Social History Main Topics  . Smoking status: Never Smoker   . Smokeless tobacco: Never Used  . Alcohol Use: Yes     Comment: wine ocassion  . Drug Use: No  . Sexually Active: Yes    Birth Control/ Protection: None   Other Topics Concern  . Not on file   Social History Narrative  . No narrative on file    Past Surgical History  Procedure Date  . Gallbladder surgery 1991    gall stone removed  . Cerebral microvascular decompression 2006    back of her head  . Breast reduction surgery 2001  . Cholecystectomy   . Mole removal     Family History  Problem Relation Age of Onset  . Breast cancer Neg Hx   . Heart disease Maternal Grandmother   . Arrhythmia Mother   . Diabetes Mother     maternal grandmother and aunts  . Hypertension Mother   . Heart attack Father   . Stroke Father   . Hyperlipidemia Father   . Hypertension Father   . Prostate cancer Maternal Grandfather   . Hypertension      parents and maternal parents    No Known Allergies  Current Outpatient Prescriptions on File  Prior to Visit  Medication Sig Dispense Refill  . atorvastatin (LIPITOR) 10 MG tablet Take 10 mg by mouth daily.      . Cholecalciferol (VITAMIN D3) 50000 UNITS CAPS Take 50,000 mg by mouth once a week.  5 capsule  5  . cyclobenzaprine (FLEXERIL) 10 MG tablet Take 1 tablet (10 mg total) by mouth every 8 (eight) hours as needed for muscle spasms. Start AFTER finishing valium.  60 tablet  1  . fluticasone (FLONASE) 50 MCG/ACT nasal spray Place 2 sprays into the nose daily.  16 g  6  . hydrochlorothiazide (HYDRODIURIL) 12.5 MG tablet Take 1 tablet (12.5 mg total) by mouth daily.  30 tablet  6  . meloxicam (MOBIC) 15 MG tablet Take 15 mg by mouth daily as needed. With food.      . metoCLOPramide (REGLAN) 5 MG tablet Take 5 mg by mouth as needed.       Marland Kitchen omeprazole (PRILOSEC) 40 MG capsule Take 40 mg by mouth 2 (two) times daily.        BP 104/74  Pulse 62  Temp 99 F (37.2 C) (Oral)  Resp 16  Wt  233 lb (105.688 kg)  SpO2 99%  LMP 08/16/2012    Objective:   Physical Exam  Constitutional: She is oriented to person, place, and time. She appears well-developed and well-nourished. No distress.  HENT:  Head: Normocephalic and atraumatic.  Right Ear: Tympanic membrane and ear canal normal.  Left Ear: Tympanic membrane and ear canal normal.  Mouth/Throat: Oropharynx is clear and moist. No oropharyngeal exudate, posterior oropharyngeal edema or posterior oropharyngeal erythema.  Cardiovascular: Normal rate and regular rhythm.   No murmur heard. Pulmonary/Chest: Effort normal and breath sounds normal. No respiratory distress. She has no wheezes. She has no rales. She exhibits no tenderness.  Musculoskeletal: She exhibits no edema.  Lymphadenopathy:    She has no cervical adenopathy.  Neurological: She is alert and oriented to person, place, and time.  Skin: Skin is warm and dry.  Psychiatric: She has a normal mood and affect. Her behavior is normal. Judgment and thought content normal.           Assessment & Plan:

## 2012-09-02 NOTE — Telephone Encounter (Signed)
gabapentin (NEURONTIN) 100 MG capsule [[id number]]   Order Details      Dose: 100 mg Route: Oral Frequency: 3 times daily    Dispense Quantity:  90 capsule Refills:  0 Fills Remaining:  0                 Sig: Take 1 capsule (100 mg total) by mouth 3 (three) times daily.               Written Date:  09/02/12 Expiration Date:  09/02/13        Start Date:  09/02/12 End Date:  --        Prescribed by:  -- Authorized by:  Sandford Craze, NP Ordering User:  Sandford Craze, NP                    Supervising Provider Edwyna Perfect            Pharmacy:  [id number] - [place], [place] - [address]      NEW MEDICATION PRESCRIPTION/SLS

## 2012-09-06 NOTE — Assessment & Plan Note (Signed)
?  occipital neuralgia.  No sign of OM at this time.  Will rx with gabapentin.

## 2012-10-15 ENCOUNTER — Ambulatory Visit: Payer: BC Managed Care – PPO | Admitting: Internal Medicine

## 2012-11-11 ENCOUNTER — Ambulatory Visit (INDEPENDENT_AMBULATORY_CARE_PROVIDER_SITE_OTHER): Payer: BC Managed Care – PPO | Admitting: Internal Medicine

## 2012-11-11 ENCOUNTER — Encounter: Payer: Self-pay | Admitting: Internal Medicine

## 2012-11-11 VITALS — BP 134/86 | HR 84 | Temp 98.3°F | Wt 237.0 lb

## 2012-11-11 DIAGNOSIS — L299 Pruritus, unspecified: Secondary | ICD-10-CM

## 2012-11-11 MED ORDER — PERMETHRIN 5 % EX CREA: TOPICAL_CREAM | Freq: Once | CUTANEOUS | Status: DC

## 2012-11-11 NOTE — Patient Instructions (Addendum)
Apply Elimite from the chin down at bedtime, take a shower the next day. May repeat the treatment 2-3 days later if you are not better. Need to wash  your clothing and bed sheets  Scabies Scabies are small bugs (mites) that burrow under the skin and cause red bumps and severe itching. These bugs can only be seen with a microscope. Scabies are highly contagious. They can spread easily from person to person by direct contact. They are also spread through sharing clothing or linens that have the scabies mites living in them. It is not unusual for an entire family to become infected through shared towels, clothing, or bedding.  HOME CARE INSTRUCTIONS   Your caregiver may prescribe a cream or lotion to kill the mites. If cream is prescribed, massage the cream into the entire body from the neck to the bottom of both feet. Also massage the cream into the scalp and face if your child is less than 107 year old. Avoid the eyes and mouth. Do not wash your hands after application.  Leave the cream on for 8 to 12 hours. Your child should bathe or shower after the 8 to 12 hour application period. Sometimes it is helpful to apply the cream to your child right before bedtime.  One treatment is usually effective and will eliminate approximately 95% of infestations. For severe cases, your caregiver may decide to repeat the treatment in 1 week. Everyone in your household should be treated with one application of the cream.  New rashes or burrows should not appear within 24 to 48 hours after successful treatment. However, the itching and rash may last for 2 to 4 weeks after successful treatment. Your caregiver may prescribe a medicine to help with the itching or to help the rash go away more quickly.  Scabies can live on clothing or linens for up to 3 days. All of your child's recently used clothing, towels, stuffed toys, and bed linens should be washed in hot water and then dried in a dryer for at least 20 minutes on  high heat. Items that cannot be washed should be enclosed in a plastic bag for at least 3 days.  To help relieve itching, bathe your child in a cool bath or apply cool washcloths to the affected areas.  Your child may return to school after treatment with the prescribed cream. SEEK MEDICAL CARE IF:   The itching persists longer than 4 weeks after treatment.  The rash spreads or becomes infected. Signs of infection include red blisters or yellow-tan crust. Document Released: 09/03/2005 Document Revised: 11/26/2011 Document Reviewed: 01/12/2009 [facility] Patient Information 2013 [place], [place].

## 2012-11-11 NOTE — Progress Notes (Signed)
  Subjective:    Patient ID: Chelsea Bray, female    DOB: 1971-07-26, 43 y.o.   MRN: [id number]  HPI Acute visit, having a rash since November last year, The rash is very subtle, mostly in the upper and lower extremities, + itching. Her husband, inlaws and brother-in-law with similar symptoms. Indeed the  brother-in-law was diagnosed with scabies. Prior to the onset of symptoms, she slept in a hotel.  Past Medical History  Diagnosis Date  . Hyperlipidemia   . Hypertension    Past Surgical History  Procedure Laterality Date  . Gallbladder surgery  1991    gall stone removed  . Cerebral microvascular decompression  2006    back of her head  . Breast reduction surgery  2001  . Cholecystectomy    . Mole removal       Review of Systems  no fever or chills No joint aches Not taking any new medication.    Objective:   Physical Exam General -- alert, well-developed NAD Skin:  Wrists and interdigital skin normal. Flexion areas of the extremities normal. She points to only 2 or 3 areas where she itches, she has skin colored bumps, ~ 1 mm in size. Neurologic-- alert & oriented X3 and strength normal in all extremities. Psych-- Cognition and judgment appear intact. Alert and cooperative with normal attention span and concentration.  not anxious appearing and not depressed appearing.        Assessment & Plan:    Generalized itching with a very subtle rash. Not taking any new medications No obvious scabies or eczema. Plan: Empiric scabies treatment given history. Husband needs to be treated. If not better, will make further eval such as CMP , etc

## 2012-12-11 ENCOUNTER — Ambulatory Visit: Payer: BC Managed Care – PPO | Admitting: Internal Medicine

## 2012-12-24 ENCOUNTER — Telehealth: Payer: Self-pay | Admitting: *Deleted

## 2012-12-24 NOTE — Telephone Encounter (Signed)
Pt states that she would like to get a refill on med Rx for scabies. Pt was last seen for this on 11-11-12 and given permethrin (ACTICIN) 5 % cream

## 2012-12-25 MED ORDER — PERMETHRIN 5 % EX CREA: TOPICAL_CREAM | Freq: Once | CUTANEOUS | Status: DC

## 2012-12-25 NOTE — Telephone Encounter (Signed)
Left detailed msg on pt's vmail.  rx sent to pharmacy.

## 2012-12-25 NOTE — Telephone Encounter (Signed)
Ok send 1 RF. If sx persist , needs derm eval. Also, it won't work if the rest of the family is not treated!

## 2013-01-06 ENCOUNTER — Ambulatory Visit (INDEPENDENT_AMBULATORY_CARE_PROVIDER_SITE_OTHER): Payer: BC Managed Care – PPO | Admitting: Family Medicine

## 2013-01-06 ENCOUNTER — Encounter: Payer: Self-pay | Admitting: Family Medicine

## 2013-01-06 VITALS — BP 142/94 | HR 90 | Temp 98.2°F | Ht 65.0 in | Wt 242.0 lb

## 2013-01-06 DIAGNOSIS — M792 Neuralgia and neuritis, unspecified: Secondary | ICD-10-CM

## 2013-01-06 DIAGNOSIS — G4700 Insomnia, unspecified: Secondary | ICD-10-CM

## 2013-01-06 DIAGNOSIS — R5381 Other malaise: Secondary | ICD-10-CM

## 2013-01-06 DIAGNOSIS — R5383 Other fatigue: Secondary | ICD-10-CM

## 2013-01-06 DIAGNOSIS — M25561 Pain in right knee: Secondary | ICD-10-CM

## 2013-01-06 DIAGNOSIS — R51 Headache: Secondary | ICD-10-CM

## 2013-01-06 DIAGNOSIS — R7309 Other abnormal glucose: Secondary | ICD-10-CM

## 2013-01-06 DIAGNOSIS — G4733 Obstructive sleep apnea (adult) (pediatric): Secondary | ICD-10-CM

## 2013-01-06 DIAGNOSIS — M25569 Pain in unspecified knee: Secondary | ICD-10-CM

## 2013-01-06 DIAGNOSIS — I10 Essential (primary) hypertension: Secondary | ICD-10-CM

## 2013-01-06 DIAGNOSIS — R739 Hyperglycemia, unspecified: Secondary | ICD-10-CM

## 2013-01-06 DIAGNOSIS — IMO0002 Reserved for concepts with insufficient information to code with codable children: Secondary | ICD-10-CM

## 2013-01-06 MED ORDER — MODAFINIL 100 MG PO TABS: 100.0000 mg | ORAL_TABLET | Freq: Every day | ORAL | Status: DC

## 2013-01-06 MED ORDER — METHOCARBAMOL 500 MG PO TABS: 500.0000 mg | ORAL_TABLET | Freq: Two times a day (BID) | ORAL | Status: DC | PRN

## 2013-01-06 MED ORDER — ZOLPIDEM TARTRATE 10 MG PO TABS: 10.0000 mg | ORAL_TABLET | Freq: Every evening | ORAL | Status: DC | PRN

## 2013-01-06 NOTE — Patient Instructions (Addendum)
Back Pain, Adult Low back pain is very common. About 1 in 5 people have back pain.The cause of low back pain is rarely dangerous. The pain often gets better over time.About half of people with a sudden onset of back pain feel better in just 2 weeks. About 8 in 10 people feel better by 6 weeks.  CAUSES Some common causes of back pain include:  Strain of the muscles or ligaments supporting the spine.  Wear and tear (degeneration) of the spinal discs.  Arthritis.  Direct injury to the back. DIAGNOSIS Most of the time, the direct cause of low back pain is not known.However, back pain can be treated effectively even when the exact cause of the pain is unknown.Answering your caregiver's questions about your overall health and symptoms is one of the most accurate ways to make sure the cause of your pain is not dangerous. If your caregiver needs more information, he or she may order lab work or imaging tests (X-rays or MRIs).However, even if imaging tests show changes in your back, this usually does not require surgery. HOME CARE INSTRUCTIONS For many people, back pain returns.Since low back pain is rarely dangerous, it is often a condition that people can learn to manageon their own.   Remain active. It is stressful on the back to sit or stand in one place. Do not sit, drive, or stand in one place for more than 30 minutes at a time. Take short walks on level surfaces as soon as pain allows.Try to increase the length of time you walk each day.  Do not stay in bed.Resting more than 1 or 2 days can delay your recovery.  Do not avoid exercise or work.Your body is made to move.It is not dangerous to be active, even though your back may hurt.Your back will likely heal faster if you return to being active before your pain is gone.  Pay attention to your body when you bend and lift. Many people have less discomfortwhen lifting if they bend their knees, keep the load close to their bodies,and  avoid twisting. Often, the most comfortable positions are those that put less stress on your recovering back.  Find a comfortable position to sleep. Use a firm mattress and lie on your side with your knees slightly bent. If you lie on your back, put a pillow under your knees.  Only take over-the-counter or prescription medicines as directed by your caregiver. Over-the-counter medicines to reduce pain and inflammation are often the most helpful.Your caregiver may prescribe muscle relaxant drugs.These medicines help dull your pain so you can more quickly return to your normal activities and healthy exercise.  Put ice on the injured area.  Put ice in a plastic bag.  Place a towel between your skin and the bag.  Leave the ice on for 15 to 20 minutes, 3 to 4 times a day for the first 2 to 3 days. After that, ice and heat may be alternated to reduce pain and spasms.  Ask your caregiver about trying back exercises and gentle massage. This may be of some benefit.  Avoid feeling anxious or stressed.Stress increases muscle tension and can worsen back pain.It is important to recognize when you are anxious or stressed and learn ways to manage it.Exercise is a great option. SEEK MEDICAL CARE IF:  You have pain that is not relieved with rest or medicine.  You have pain that does not improve in 1 week.  You have new symptoms.  You are generally   not feeling well. SEEK IMMEDIATE MEDICAL CARE IF:   You have pain that radiates from your back into your legs.  You develop new bowel or bladder control problems.  You have unusual weakness or numbness in your arms or legs.  You develop nausea or vomiting.  You develop abdominal pain.  You feel faint. Document Released: 09/03/2005 Document Revised: 03/04/2012 Document Reviewed: 01/22/2011 [employer] Patient Information 2013 [employer].  

## 2013-01-07 LAB — HEPATIC FUNCTION PANEL
ALT: 19 U/L (ref 0–35)
Albumin: 4.3 g/dL (ref 3.5–5.2)
Alkaline Phosphatase: 49 U/L (ref 39–117)
Indirect Bilirubin: 0.2 mg/dL (ref 0.0–0.9)
Total Protein: 7.1 g/dL (ref 6.0–8.3)

## 2013-01-07 LAB — HEMOGLOBIN A1C
Hgb A1c MFr Bld: 6.4 % — ABNORMAL HIGH (ref <5.7)
Mean Plasma Glucose: 137 mg/dL — ABNORMAL HIGH (ref <117)

## 2013-01-07 LAB — TSH: TSH: 1.116 u[IU]/mL (ref 0.350–4.500)

## 2013-01-10 ENCOUNTER — Encounter: Payer: Self-pay | Admitting: Family Medicine

## 2013-01-10 DIAGNOSIS — Z6838 Body mass index (BMI) 38.0-38.9, adult: Secondary | ICD-10-CM | POA: Insufficient documentation

## 2013-01-10 HISTORY — DX: Morbid (severe) obesity due to excess calories: E66.01

## 2013-01-10 NOTE — Assessment & Plan Note (Signed)
Uses CPAP regularly °

## 2013-01-10 NOTE — Assessment & Plan Note (Signed)
Encouraged DASH diet, increased exercise, decreased po intake, use lean proteins and complex carbs. She is considering a bypass surgery. Is encouraged to research programs and decide where she would like to receive her care, return in 1 month for weight check
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
## 2013-01-10 NOTE — Assessment & Plan Note (Signed)
Mild elevation today, encouraged DASH diet.

## 2013-01-10 NOTE — Assessment & Plan Note (Signed)
C/o paresthesias in right hand upon sleeping off and on for past 6 months, encouraged most heat and gentle stretching
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
## 2013-01-26 ENCOUNTER — Telehealth: Payer: Self-pay | Admitting: Family Medicine

## 2013-01-26 ENCOUNTER — Telehealth: Payer: Self-pay

## 2013-01-26 NOTE — Telephone Encounter (Signed)
Opened in error

## 2013-01-26 NOTE — Telephone Encounter (Signed)
PA form sent

## 2013-02-04 ENCOUNTER — Telehealth: Payer: Self-pay | Admitting: Family Medicine

## 2013-02-04 ENCOUNTER — Ambulatory Visit: Payer: BC Managed Care – PPO | Admitting: Family Medicine

## 2013-02-04 DIAGNOSIS — Z0289 Encounter for other administrative examinations: Secondary | ICD-10-CM

## 2013-02-04 NOTE — Telephone Encounter (Signed)
Opened in error

## 2013-02-23 NOTE — Telephone Encounter (Signed)
PA form recent

## 2013-03-05 ENCOUNTER — Ambulatory Visit: Payer: BC Managed Care – PPO | Admitting: Family

## 2013-03-11 ENCOUNTER — Encounter: Payer: Self-pay | Admitting: Family

## 2013-03-11 ENCOUNTER — Ambulatory Visit (INDEPENDENT_AMBULATORY_CARE_PROVIDER_SITE_OTHER): Payer: BC Managed Care – PPO | Admitting: Family

## 2013-03-11 ENCOUNTER — Other Ambulatory Visit (HOSPITAL_COMMUNITY)
Admission: RE | Admit: 2013-03-11 | Discharge: 2013-03-11 | Disposition: A | Discharge status: Home / Self Care | Payer: BC Managed Care – PPO | Source: Ambulatory Visit | Attending: Family | Admitting: Family

## 2013-03-11 ENCOUNTER — Ambulatory Visit: Payer: BC Managed Care – PPO | Admitting: Family

## 2013-03-11 VITALS — BP 120/88 | HR 79 | Temp 98.2°F | Resp 16 | Ht 65.0 in | Wt 238.1 lb

## 2013-03-11 DIAGNOSIS — N760 Acute vaginitis: Secondary | ICD-10-CM | POA: Insufficient documentation

## 2013-03-11 DIAGNOSIS — R519 Headache, unspecified: Secondary | ICD-10-CM | POA: Insufficient documentation

## 2013-03-11 DIAGNOSIS — Z01419 Encounter for gynecological examination (general) (routine) without abnormal findings: Secondary | ICD-10-CM | POA: Insufficient documentation

## 2013-03-11 DIAGNOSIS — R51 Headache: Secondary | ICD-10-CM

## 2013-03-11 DIAGNOSIS — L293 Anogenital pruritus, unspecified: Secondary | ICD-10-CM

## 2013-03-11 DIAGNOSIS — N898 Other specified noninflammatory disorders of vagina: Secondary | ICD-10-CM

## 2013-03-11 LAB — HM PAP SMEAR: HM Pap smear: NORMAL

## 2013-03-11 NOTE — Assessment & Plan Note (Signed)
Will refer to headache clinic for further evaluation.

## 2013-03-11 NOTE — Patient Instructions (Addendum)
You will be contacted about your referral to the headache clinic. Please let us know if you have not heard back within 1 week about your referral. We will let you know about the test we performed today.

## 2013-03-11 NOTE — Assessment & Plan Note (Signed)
Wet prep performed today along with Pap smear.  Plan rx pending wet prep results.
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
## 2013-03-12 ENCOUNTER — Telehealth: Payer: Self-pay | Admitting: *Deleted

## 2013-03-12 ENCOUNTER — Telehealth: Payer: Self-pay | Admitting: Family

## 2013-03-12 LAB — WET PREP BY MOLECULAR PROBE
Gardnerella vaginalis: POSITIVE — AB
Trichomonas vaginosis: NEGATIVE

## 2013-03-12 MED ORDER — METRONIDAZOLE 500 MG PO TABS: 500.0000 mg | ORAL_TABLET | Freq: Two times a day (BID) | ORAL | Status: DC

## 2013-03-12 NOTE — Telephone Encounter (Signed)
Wet prep shows Bacterial vaginosis.  Rx sent for metronidazole to her pharmacy.  Avoid alcohol while taking this medication.

## 2013-03-12 NOTE — Telephone Encounter (Signed)
Received call from patient stating provigil will need prior auth through the insurance. Need to know if pt is or has used a CPAP for OSA to complete PA? Pt states she is using a CPAP machine. Printed / completed form and forwarded to Provider for signature. Pt aware that we will contact her once we receive approval/denial from insurance company.

## 2013-03-12 NOTE — Telephone Encounter (Signed)
Notified pt. 

## 2013-03-13 NOTE — Telephone Encounter (Signed)
Provigil Tablet has been approved 02-20-13 through 03-13-14

## 2013-03-17 ENCOUNTER — Encounter: Payer: Self-pay | Admitting: Family

## 2013-03-18 ENCOUNTER — Ambulatory Visit (INDEPENDENT_AMBULATORY_CARE_PROVIDER_SITE_OTHER): Payer: BC Managed Care – PPO | Admitting: Neurology

## 2013-03-18 ENCOUNTER — Encounter: Payer: Self-pay | Admitting: Neurology

## 2013-03-18 VITALS — BP 124/78 | HR 82 | Temp 98.6°F | Ht 65.0 in | Wt 240.0 lb

## 2013-03-18 DIAGNOSIS — G43909 Migraine, unspecified, not intractable, without status migrainosus: Secondary | ICD-10-CM

## 2013-03-18 NOTE — Patient Instructions (Addendum)
Likely post-traumatic tension headaches exacerbated by neck pain.  Less likely related to the chiari malformation. 1.  To be sure, we will get an MRI Brain and cervical spine to look for any pathology. 2.  If MRI looks okay, we will then refer you to physical therapy. 3.  If physical therapy not helpful, we will discuss possible medications, such as amitriptyline. 4.  In the meantime, limit use of pain meds (Aleve), to no more than 3 days out of the week (preferably 2 or less). 5.  Follow up in one month.  Your MRI is scheduled at [facility] located at [address] in [place] on Friday, July 11th at 4:30. Please arrive 15 minutes prior to your appointment time.   [phone].
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
## 2013-03-27 ENCOUNTER — Ambulatory Visit
Admission: RE | Admit: 2013-03-27 | Discharge: 2013-03-27 | Disposition: A | Discharge status: Home / Self Care | Payer: BC Managed Care – PPO | Source: Ambulatory Visit | Attending: Neurology | Admitting: Neurology

## 2013-03-27 DIAGNOSIS — G43909 Migraine, unspecified, not intractable, without status migrainosus: Secondary | ICD-10-CM

## 2013-03-30 ENCOUNTER — Telehealth: Payer: Self-pay | Admitting: Neurology

## 2013-03-30 NOTE — Telephone Encounter (Signed)
Spoke with the patient. Information given as per Dr. Everlena Cooper below. No other issues or concerns voiced at this time.

## 2013-03-30 NOTE — Telephone Encounter (Signed)
Message copied by Benay Spice on Mon Mar 30, 2013  2:42 PM ------      Message from: JAFFE, ADAM R      Created: Mon Mar 30, 2013  7:12 AM       Please let Ms. Augsburger know that the MRI shows only evidence of the surgery, but no active problems like Chiari or syrinx.  It looks okay. ------

## 2013-04-20 ENCOUNTER — Ambulatory Visit: Payer: BC Managed Care – PPO | Admitting: Neurology

## 2013-05-13 ENCOUNTER — Ambulatory Visit: Payer: BC Managed Care – PPO | Admitting: Neurology

## 2013-06-01 ENCOUNTER — Ambulatory Visit: Payer: BC Managed Care – PPO | Admitting: Neurology

## 2013-06-17 LAB — HM MAMMOGRAPHY: HM Mammogram: NORMAL

## 2013-07-14 ENCOUNTER — Ambulatory Visit: Payer: BC Managed Care – PPO | Admitting: Family Medicine

## 2013-07-16 ENCOUNTER — Ambulatory Visit: Payer: BC Managed Care – PPO | Admitting: Family Medicine

## 2013-07-21 ENCOUNTER — Encounter: Payer: Self-pay | Admitting: Neurology

## 2013-07-21 ENCOUNTER — Ambulatory Visit (INDEPENDENT_AMBULATORY_CARE_PROVIDER_SITE_OTHER): Payer: BC Managed Care – PPO | Admitting: Family

## 2013-07-21 ENCOUNTER — Ambulatory Visit (INDEPENDENT_AMBULATORY_CARE_PROVIDER_SITE_OTHER): Payer: BC Managed Care – PPO | Admitting: Neurology

## 2013-07-21 ENCOUNTER — Encounter: Payer: Self-pay | Admitting: Family

## 2013-07-21 ENCOUNTER — Other Ambulatory Visit: Payer: Self-pay | Admitting: Family

## 2013-07-21 VITALS — BP 120/78 | HR 80 | Temp 98.5°F | Ht 65.0 in | Wt 235.0 lb

## 2013-07-21 VITALS — BP 130/86 | HR 87 | Temp 98.4°F | Resp 16 | Ht 65.0 in | Wt 236.1 lb

## 2013-07-21 DIAGNOSIS — R200 Anesthesia of skin: Secondary | ICD-10-CM

## 2013-07-21 DIAGNOSIS — G4733 Obstructive sleep apnea (adult) (pediatric): Secondary | ICD-10-CM

## 2013-07-21 DIAGNOSIS — G5601 Carpal tunnel syndrome, right upper limb: Secondary | ICD-10-CM

## 2013-07-21 DIAGNOSIS — M542 Cervicalgia: Secondary | ICD-10-CM

## 2013-07-21 DIAGNOSIS — G4700 Insomnia, unspecified: Secondary | ICD-10-CM

## 2013-07-21 DIAGNOSIS — IMO0002 Reserved for concepts with insufficient information to code with codable children: Secondary | ICD-10-CM

## 2013-07-21 DIAGNOSIS — G5600 Carpal tunnel syndrome, unspecified upper limb: Secondary | ICD-10-CM

## 2013-07-21 DIAGNOSIS — R209 Unspecified disturbances of skin sensation: Secondary | ICD-10-CM

## 2013-07-21 DIAGNOSIS — R51 Headache: Secondary | ICD-10-CM

## 2013-07-21 DIAGNOSIS — M792 Neuralgia and neuritis, unspecified: Secondary | ICD-10-CM

## 2013-07-21 DIAGNOSIS — E785 Hyperlipidemia, unspecified: Secondary | ICD-10-CM

## 2013-07-21 MED ORDER — GABAPENTIN 100 MG PO CAPS: 100.0000 mg | ORAL_CAPSULE | Freq: Three times a day (TID) | ORAL | Status: DC

## 2013-07-21 MED ORDER — MELOXICAM 7.5 MG PO TABS: 7.5000 mg | ORAL_TABLET | Freq: Every day | ORAL | Status: DC

## 2013-07-21 MED ORDER — ZOLPIDEM TARTRATE 10 MG PO TABS: 10.0000 mg | ORAL_TABLET | Freq: Every evening | ORAL | Status: DC | PRN

## 2013-07-21 MED ORDER — MODAFINIL 100 MG PO TABS: 100.0000 mg | ORAL_TABLET | Freq: Every day | ORAL | Status: DC

## 2013-07-21 NOTE — Patient Instructions (Addendum)
Your headaches do seem like it is coming from your neck.  Also, it may be related to medication overuse (ibuprofen) and possible uncontrolled sleep apnea. 1.  Physical therapy for your neck. 2.  Limit use of pain medications (even ibuprofen or tylenol) to no more than 2 days out of the week. 3.  Recommend following up with sleep specialist to re-evaluate settings of your CPAP. 4.  Follow up in 6 weeks.  We will refer you to Physical Therapy for your headaches and neck pain. They will call you to schedule the appointment.    [phone]

## 2013-07-21 NOTE — Assessment & Plan Note (Signed)
Hx most consistent with CTS.  Attempt trial of right wrist splint, short course of NSAIDS.

## 2013-07-21 NOTE — Assessment & Plan Note (Signed)
Given family hx of CVA, CAD, DM2- I encouraged her to resume statin for prevention and she is agreeable.

## 2013-07-21 NOTE — Patient Instructions (Signed)
Please start meloxicam. Complete lab work prior to leaving. Wear wrist brace at bedtime and as you are able throughout the day. Follow up in 6 weeks.

## 2013-07-21 NOTE — Assessment & Plan Note (Signed)
Numbness in toes right foot. Could be related to DDD L spine.  Attempt meloxicam, obtain b12 and folate. Consider MRI lumbar spine if symptoms worsen or if symptoms do not improve.

## 2013-07-21 NOTE — Progress Notes (Signed)
Subjective:    Patient ID: Chelsea Bray, female    DOB: Jul 08, 1971, 42 y.o.   MRN: [id number]  HPI  [name] is a 42 yr old female who presents today with two concerns:  1) Numbness- reports she will often wake up with numbness in the fingers of the fingers right hand. Numbness can last all day.  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 Has numbness in the pinky toe and second are numb. She is concerned re: poor circuculation. Reports that the right foot goes to sleep really quickly.  Reports occasional tingling in the right leg which "comes from the back." she reports occasion bilateral LE weakness and can feel off balance when she walks.    2) Hyperlipidemia-  Reports that she did take lipitor. Denied associated side effects.  Reports that she stopped due to concern of developing DM due to statin use.    Review of Systems See HPI  Past Medical History  Diagnosis Date  . Hyperlipidemia   . Hypertension   . Morbid obesity 01/10/2013  . Acid reflux disease     History   Social History  . Marital Status: Married    Spouse Name: N/A    Number of Children: N/A  . Years of Education: N/A   Occupational History  . Not on file.   Social History Main Topics  . Smoking status: Never Smoker   . Smokeless tobacco: Never Used  . Alcohol Use: Yes     Comment: wine ocassion  . Drug Use: No  . Sexual Activity: Yes    Birth Control/ Protection: None   Other Topics Concern  . Not on file   Social History Narrative  . No narrative on file    Past Surgical History  Procedure Laterality Date  . Gallbladder surgery  1991    gall stone removed  . Cerebral microvascular decompression  2006    back of her head  . Breast reduction surgery  2001  . Cholecystectomy    . Mole removal      Family History  Problem Relation Age of Onset  . Breast cancer Neg Hx   . Heart disease Maternal Grandmother   . Arrhythmia Mother   . Diabetes Mother      maternal grandmother and aunts  . Hypertension Mother   . Heart attack Father   . Stroke Father   . Hyperlipidemia Father   . Hypertension Father   . Prostate cancer Maternal Grandfather   . Hypertension      parents and maternal parents    No Known Allergies  Current Outpatient Prescriptions on File Prior to Visit  Medication Sig Dispense Refill  . atorvastatin (LIPITOR) 10 MG tablet Take 10 mg by mouth daily.      . Cholecalciferol (VITAMIN D3) 50000 UNITS CAPS Take 50,000 mg by mouth once a week.  5 capsule  5  . fluticasone (FLONASE) 50 MCG/ACT nasal spray Place 2 sprays into the nose daily.  16 g  6  . gabapentin (NEURONTIN) 100 MG capsule Take 1 capsule (100 mg total) by mouth 3 (three) times daily.  90 capsule  0  . hydrochlorothiazide (HYDRODIURIL) 12.5 MG tablet Take 1 tablet (12.5 mg total) by mouth daily.  30 tablet  6  . methocarbamol (ROBAXIN) 500 MG tablet Take 1 tablet (500 mg total) by mouth 2 (two) times daily as needed.  60 tablet  1  . modafinil (PROVIGIL) 100  MG tablet Take 1 tablet (100 mg total) by mouth daily.  30 tablet  0  . omeprazole (PRILOSEC) 40 MG capsule Take 40 mg by mouth 2 (two) times daily.      Marland Kitchen zolpidem (AMBIEN) 10 MG tablet Take 1 tablet (10 mg total) by mouth at bedtime as needed for sleep.  30 tablet  1   No current facility-administered medications on file prior to visit.    BP 130/86  Pulse 87  Temp(Src) 98.4 F (36.9 C) (Oral)  Resp 16  Ht 5\' 5"  (1.651 m)  Wt 236 lb 1.3 oz (107.085 kg)  BMI 39.29 kg/m2  SpO2 99%  LMP 06/17/2013       Objective:   Physical Exam  Constitutional: She is oriented to person, place, and time. She appears well-developed and well-nourished. No distress.  HENT:  Head: Normocephalic.  Cardiovascular: Normal rate and regular rhythm.   No murmur heard. Pulmonary/Chest: Effort normal and breath sounds normal. No respiratory distress. She has no wheezes. She has no rales. She exhibits no tenderness.   Neurological: She is alert and oriented to person, place, and time.  Neg tinels sign right hand Neg phalans R foot and right toes + sensation to monofilament.  Bilateral LE strength is 5/5  Psychiatric: She has a normal mood and affect. Her behavior is normal. Judgment and thought content normal.          Assessment & Plan:
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
## 2013-07-22 ENCOUNTER — Encounter: Payer: Self-pay | Admitting: Family

## 2013-07-22 ENCOUNTER — Other Ambulatory Visit: Payer: Self-pay | Admitting: Family

## 2013-07-22 ENCOUNTER — Other Ambulatory Visit: Payer: Self-pay | Admitting: Internal Medicine

## 2013-07-22 ENCOUNTER — Other Ambulatory Visit: Payer: Self-pay | Admitting: Family Medicine

## 2013-07-22 NOTE — Telephone Encounter (Signed)
Pt given rx for 30 day supply by me on 11/4.  I cannot give 90 day supply.  Will defer to Dr. Abner Greenspan.

## 2013-07-22 NOTE — Telephone Encounter (Signed)
Rx request to pharmacy/SLS  

## 2013-07-23 ENCOUNTER — Other Ambulatory Visit: Payer: Self-pay

## 2013-07-29 ENCOUNTER — Telehealth: Payer: Self-pay | Admitting: Neurology

## 2013-07-29 NOTE — Telephone Encounter (Signed)
Left the patient a message stating she can call to schedule with the center of her choice as the order for PT is in EPIC and all locations with [facility] have access. Asked that she call if additional questions.

## 2013-08-11 ENCOUNTER — Ambulatory Visit: Payer: BC Managed Care – PPO | Attending: Neurology | Admitting: Rehabilitation

## 2013-08-11 DIAGNOSIS — M542 Cervicalgia: Secondary | ICD-10-CM | POA: Insufficient documentation

## 2013-08-11 DIAGNOSIS — IMO0001 Reserved for inherently not codable concepts without codable children: Secondary | ICD-10-CM | POA: Insufficient documentation

## 2013-08-17 ENCOUNTER — Ambulatory Visit: Payer: BC Managed Care – PPO | Admitting: Rehabilitation

## 2013-08-19 ENCOUNTER — Ambulatory Visit: Payer: BC Managed Care – PPO | Attending: Neurology | Admitting: Rehabilitation

## 2013-08-19 DIAGNOSIS — M542 Cervicalgia: Secondary | ICD-10-CM | POA: Insufficient documentation

## 2013-08-19 DIAGNOSIS — IMO0001 Reserved for inherently not codable concepts without codable children: Secondary | ICD-10-CM | POA: Insufficient documentation

## 2013-08-24 ENCOUNTER — Ambulatory Visit: Payer: BC Managed Care – PPO | Admitting: Rehabilitation

## 2013-08-26 ENCOUNTER — Ambulatory Visit: Payer: BC Managed Care – PPO | Admitting: Rehabilitation

## 2013-08-31 ENCOUNTER — Ambulatory Visit: Payer: BC Managed Care – PPO | Admitting: Rehabilitation

## 2013-09-02 ENCOUNTER — Ambulatory Visit: Payer: BC Managed Care – PPO | Admitting: Rehabilitation

## 2013-09-02 ENCOUNTER — Ambulatory Visit: Payer: BC Managed Care – PPO | Admitting: Neurology

## 2013-09-04 ENCOUNTER — Encounter: Payer: BC Managed Care – PPO | Admitting: Family Medicine

## 2013-09-07 ENCOUNTER — Ambulatory Visit: Payer: BC Managed Care – PPO | Admitting: Rehabilitation

## 2013-09-21 ENCOUNTER — Ambulatory Visit: Payer: BC Managed Care – PPO | Admitting: Rehabilitation

## 2013-09-23 ENCOUNTER — Ambulatory Visit: Payer: BC Managed Care – PPO | Attending: Neurology | Admitting: Rehabilitation

## 2013-09-23 DIAGNOSIS — M542 Cervicalgia: Secondary | ICD-10-CM | POA: Insufficient documentation

## 2013-09-23 DIAGNOSIS — IMO0001 Reserved for inherently not codable concepts without codable children: Secondary | ICD-10-CM | POA: Insufficient documentation

## 2013-09-27 ENCOUNTER — Emergency Department (HOSPITAL_BASED_OUTPATIENT_CLINIC_OR_DEPARTMENT_OTHER): Payer: BC Managed Care – PPO

## 2013-09-27 ENCOUNTER — Emergency Department (HOSPITAL_BASED_OUTPATIENT_CLINIC_OR_DEPARTMENT_OTHER)
Admission: EM | Admit: 2013-09-27 | Discharge: 2013-09-27 | Disposition: A | Discharge status: Home / Self Care | Payer: BC Managed Care – PPO | Attending: Emergency Medicine | Admitting: Emergency Medicine

## 2013-09-27 ENCOUNTER — Encounter (HOSPITAL_BASED_OUTPATIENT_CLINIC_OR_DEPARTMENT_OTHER): Payer: Self-pay | Admitting: Emergency Medicine

## 2013-09-27 DIAGNOSIS — R5381 Other malaise: Secondary | ICD-10-CM | POA: Insufficient documentation

## 2013-09-27 DIAGNOSIS — K219 Gastro-esophageal reflux disease without esophagitis: Secondary | ICD-10-CM | POA: Insufficient documentation

## 2013-09-27 DIAGNOSIS — E785 Hyperlipidemia, unspecified: Secondary | ICD-10-CM | POA: Insufficient documentation

## 2013-09-27 DIAGNOSIS — Z79899 Other long term (current) drug therapy: Secondary | ICD-10-CM | POA: Insufficient documentation

## 2013-09-27 DIAGNOSIS — J111 Influenza due to unidentified influenza virus with other respiratory manifestations: Secondary | ICD-10-CM

## 2013-09-27 DIAGNOSIS — IMO0002 Reserved for concepts with insufficient information to code with codable children: Secondary | ICD-10-CM | POA: Insufficient documentation

## 2013-09-27 DIAGNOSIS — J9801 Acute bronchospasm: Secondary | ICD-10-CM | POA: Insufficient documentation

## 2013-09-27 DIAGNOSIS — R5383 Other fatigue: Secondary | ICD-10-CM

## 2013-09-27 DIAGNOSIS — I10 Essential (primary) hypertension: Secondary | ICD-10-CM | POA: Insufficient documentation

## 2013-09-27 DIAGNOSIS — M549 Dorsalgia, unspecified: Secondary | ICD-10-CM | POA: Insufficient documentation

## 2013-09-27 DIAGNOSIS — R000 Tachycardia, unspecified: Secondary | ICD-10-CM | POA: Insufficient documentation

## 2013-09-27 MED ORDER — ALBUTEROL SULFATE HFA 108 (90 BASE) MCG/ACT IN AERS: 2.0000 | INHALATION_SPRAY | RESPIRATORY_TRACT | Status: DC | PRN

## 2013-09-27 MED ORDER — ALBUTEROL SULFATE HFA 108 (90 BASE) MCG/ACT IN AERS
2.0000 | INHALATION_SPRAY | RESPIRATORY_TRACT | Status: DC | PRN
  Administered 2013-09-27: 2 via RESPIRATORY_TRACT

## 2013-09-27 MED ORDER — PREDNISONE 20 MG PO TABS
ORAL_TABLET | ORAL | Status: DC
Start: 2013-09-27 — End: 2013-11-23

## 2013-09-27 MED ORDER — PREDNISONE 50 MG PO TABS
60.0000 mg | ORAL_TABLET | Freq: Once | ORAL | Status: AC
  Administered 2013-09-27: 23:00:00 60 mg via ORAL
  Filled 2013-09-27 (×2): qty 1

## 2013-09-27 MED ORDER — ALBUTEROL SULFATE HFA 108 (90 BASE) MCG/ACT IN AERS
INHALATION_SPRAY | RESPIRATORY_TRACT | Status: AC
  Administered 2013-09-27: 23:00:00 2 via RESPIRATORY_TRACT
  Filled 2013-09-27: qty 6.7

## 2013-09-27 NOTE — ED Provider Notes (Signed)
CSN: [id number]     Arrival date & time 09/27/13  2154 History   First MD Initiated Contact with Patient 09/27/13 2242     Chief Complaint  Patient presents with  . Fever  . 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 There is no treatment prior to arrival. Past Medical History  Diagnosis Date  . Hyperlipidemia   . Hypertension   . Morbid obesity 01/10/2013  . Acid reflux disease    Past Surgical History  Procedure Laterality Date  . Gallbladder surgery  1991    gall stone removed  . Cerebral microvascular decompression  2006    back of her head  . Breast reduction surgery  2001  . Cholecystectomy    . Mole removal     Family History  Problem Relation Age of Onset  . Breast cancer Neg Hx   . Heart disease Maternal Grandmother   . Arrhythmia Mother   . Diabetes Mother     maternal grandmother and aunts  . Hypertension Mother   . Heart attack Father   . Stroke Father   . Hyperlipidemia Father   . Hypertension Father   . Prostate cancer Maternal Grandfather   . Hypertension      parents and maternal parents   History  Substance Use Topics  . Smoking status: Never Smoker   . Smokeless tobacco: Never Used  . Alcohol Use: Yes     Comment: wine ocassion   OB History   Grav Para Term Preterm Abortions TAB SAB Ect Mult Living                 Review of Systems 10 Systems reviewed and are negative for acute change except as noted in the HPI. Allergies  Review of patient's allergies indicates no known allergies.  Home Medications   Current  Outpatient Rx  Name  Route  Sig  Dispense  Refill  . atorvastatin (LIPITOR) 10 MG tablet   Oral   Take 10 mg by mouth daily.         Marland Kitchen atorvastatin (LIPITOR) 20 MG tablet      TAKE 1 TABLET BY MOUTH DAILY   30 tablet   0   . Cholecalciferol (VITAMIN D3) 50000 UNITS CAPS   Oral   Take 50,000 mg by mouth once a week.   5 capsule   5   . fluticasone (FLONASE) 50 MCG/ACT nasal spray   Nasal   Place 2 sprays into the nose daily.   16 g   6   . gabapentin (NEURONTIN) 100 MG capsule      TAKE 1 CAPSULE BY MOUTH THREE TIMES DAILY   270 capsule   0     **Patient requests 90 days supply**   . hydrochlorothiazide (HYDRODIURIL) 12.5 MG tablet   Oral   Take 1 tablet (12.5 mg total) by mouth daily.   30 tablet   6   . meloxicam (MOBIC) 7.5 MG tablet   Oral   Take 1 tablet (7.5 mg total) by mouth daily.   15 tablet   0   . omeprazole (PRILOSEC) 40  MG capsule   Oral   Take 40 mg by mouth 2 (two) times daily.         Marland Kitchen. zolpidem (AMBIEN) 10 MG tablet   Oral   Take 1 tablet (10 mg total) by mouth at bedtime as needed for sleep.   30 tablet   0   . albuterol (PROVENTIL HFA;VENTOLIN HFA) 108 (90 BASE) MCG/ACT inhaler   Inhalation   Inhale 2 puffs into the lungs every 2 (two) hours as needed for wheezing or shortness of breath (cough).   1 Inhaler   0   . methocarbamol (ROBAXIN) 500 MG tablet   Oral   Take 1 tablet (500 mg total) by mouth 2 (two) times daily as needed.   60 tablet   1   . modafinil (PROVIGIL) 100 MG tablet      TAKE 1 TABLET BY MOUTH ONCE DAILY   90 tablet   0     **Patient requests 90 days supply**   . predniSONE (DELTASONE) 20 MG tablet      2 tabs po daily x 4 days   8 tablet   0    BP 154/102  Pulse 104  Temp(Src) 99.3 F (37.4 C) (Oral)  Resp 20  Ht 5\' 5"  (1.651 m)  Wt 236 lb (107.049 kg)  BMI 39.27 kg/m2  SpO2 98%  LMP 09/09/2013 Physical Exam  Nursing note and vitals reviewed. Constitutional:  Awake, alert, nontoxic  appearance.  HENT:  Head: Atraumatic.  Eyes: Right eye exhibits no discharge. Left eye exhibits no discharge.  Neck: Neck supple.  Cardiovascular: Regular rhythm.   No murmur heard. Mildly tachycardic  Pulmonary/Chest: Effort normal. No respiratory distress. She has wheezes. She has no rales. She exhibits no tenderness.  Normal speech with pulse oximetry normal on room air 100% no retractions no accessory muscle usage however patient does have diffuse mild scattered rhonchi and expiratory wheezes  Abdominal: Soft. There is no tenderness. There is no rebound.  Musculoskeletal: She exhibits tenderness. She exhibits no edema.  Baseline ROM, no obvious new focal weakness. Mild diffuse tenderness to entire back upper chest and all 4 extremities.  Neurological:  Mental status and motor strength appears baseline for patient and situation.  Skin: No rash noted.  Psychiatric: She has a normal mood and affect.    ED Course  Procedures (including critical care time) Patient informed of clinical course, understand medical decision-making process, and agree with plan. Labs Review Labs Reviewed - No data to display Imaging Review Dg Chest 2 View  09/27/2013   CLINICAL DATA:  Cough and fever.  EXAM: CHEST  2 VIEW  COMPARISON:  None.  FINDINGS: The heart size and mediastinal contours are within normal limits. Both lungs are clear. No effusion or pneumothorax. The visualized skeletal structures are unremarkable.  IMPRESSION: No active cardiopulmonary disease.   Electronically Signed   By: Tiburcio PeaJonathan  Watts M.D.   On: 09/27/2013 22:41    EKG Interpretation   None       MDM   1. Bronchospasm   2. Influenza    I doubt any other EMC precluding discharge at this time including, but not necessarily limited to the following:SBI.    Hurman HornJohn M Ashland Wiseman, MD 09/28/13 2211

## 2013-09-27 NOTE — ED Notes (Signed)
Cough, fever, bodyaches since friday

## 2013-09-27 NOTE — Discharge Instructions (Signed)
You appear to have an upper respiratory infection (URI). An upper respiratory tract infection, or cold, is a viral infection of the air passages leading to the lungs. It is contagious and can be spread to others, especially during the first 3 or 4 days. It cannot be cured by antibiotics or other medicines. °RETURN IMMEDIATELY IF you develop shortness of breath, confusion or altered mental status, a new rash, become dizzy, faint, or poorly responsive, or are unable to be cared for at home. ° °

## 2013-11-23 ENCOUNTER — Encounter: Payer: Self-pay | Admitting: Family Medicine

## 2013-11-23 ENCOUNTER — Ambulatory Visit (INDEPENDENT_AMBULATORY_CARE_PROVIDER_SITE_OTHER): Payer: BC Managed Care – PPO | Admitting: Family Medicine

## 2013-11-23 VITALS — BP 120/94 | HR 83 | Temp 98.4°F | Ht 65.0 in | Wt 238.1 lb

## 2013-11-23 DIAGNOSIS — R52 Pain, unspecified: Secondary | ICD-10-CM

## 2013-11-23 DIAGNOSIS — G609 Hereditary and idiopathic neuropathy, unspecified: Secondary | ICD-10-CM

## 2013-11-23 DIAGNOSIS — K219 Gastro-esophageal reflux disease without esophagitis: Secondary | ICD-10-CM

## 2013-11-23 DIAGNOSIS — I10 Essential (primary) hypertension: Secondary | ICD-10-CM

## 2013-11-23 DIAGNOSIS — R0789 Other chest pain: Secondary | ICD-10-CM

## 2013-11-23 DIAGNOSIS — G4733 Obstructive sleep apnea (adult) (pediatric): Secondary | ICD-10-CM

## 2013-11-23 DIAGNOSIS — Z23 Encounter for immunization: Secondary | ICD-10-CM

## 2013-11-23 DIAGNOSIS — E785 Hyperlipidemia, unspecified: Secondary | ICD-10-CM

## 2013-11-23 DIAGNOSIS — T7840XA Allergy, unspecified, initial encounter: Secondary | ICD-10-CM

## 2013-11-23 DIAGNOSIS — R739 Hyperglycemia, unspecified: Secondary | ICD-10-CM

## 2013-11-23 DIAGNOSIS — R7309 Other abnormal glucose: Secondary | ICD-10-CM

## 2013-11-23 DIAGNOSIS — E559 Vitamin D deficiency, unspecified: Secondary | ICD-10-CM

## 2013-11-23 DIAGNOSIS — Z0000 Encounter for general adult medical examination without abnormal findings: Secondary | ICD-10-CM

## 2013-11-23 DIAGNOSIS — G4730 Sleep apnea, unspecified: Secondary | ICD-10-CM

## 2013-11-23 LAB — LIPID PANEL
Cholesterol: 181 mg/dL (ref 0–200)
HDL: 39 mg/dL — ABNORMAL LOW (ref >39)
LDL Cholesterol: 120 mg/dL — ABNORMAL HIGH (ref 0–99)
Total CHOL/HDL Ratio: 4.6 Ratio
Triglycerides: 110 mg/dL (ref <150)
VLDL: 22 mg/dL (ref 0–40)

## 2013-11-23 LAB — HEPATIC FUNCTION PANEL
ALBUMIN: 4.2 g/dL (ref 3.5–5.2)
ALT: 20 U/L (ref 0–35)
AST: 16 U/L (ref 0–37)
Alkaline Phosphatase: 52 U/L (ref 39–117)
Bilirubin, Direct: 0.1 mg/dL (ref 0.0–0.3)
Indirect Bilirubin: 0.3 mg/dL (ref 0.2–1.2)
TOTAL PROTEIN: 7 g/dL (ref 6.0–8.3)
Total Bilirubin: 0.4 mg/dL (ref 0.2–1.2)

## 2013-11-23 LAB — CBC
HCT: 38.8 % (ref 36.0–46.0)
Hemoglobin: 13.3 g/dL (ref 12.0–15.0)
MCH: 31.1 pg (ref 26.0–34.0)
MCHC: 34.3 g/dL (ref 30.0–36.0)
MCV: 90.7 fL (ref 78.0–100.0)
Platelets: 367 10*3/uL (ref 150–400)
RBC: 4.28 MIL/uL (ref 3.87–5.11)
RDW: 14.3 % (ref 11.5–15.5)
WBC: 8.4 10*3/uL (ref 4.0–10.5)

## 2013-11-23 LAB — RENAL FUNCTION PANEL
ALBUMIN: 4.2 g/dL (ref 3.5–5.2)
BUN: 8 mg/dL (ref 6–23)
CO2: 31 meq/L (ref 19–32)
Calcium: 9.5 mg/dL (ref 8.4–10.5)
Chloride: 102 mEq/L (ref 96–112)
Creat: 0.78 mg/dL (ref 0.50–1.10)
GLUCOSE: 115 mg/dL — AB (ref 70–99)
Phosphorus: 3.9 mg/dL (ref 2.3–4.6)
Potassium: 4.5 mEq/L (ref 3.5–5.3)
Sodium: 140 mEq/L (ref 135–145)

## 2013-11-23 LAB — HEMOGLOBIN A1C
Hgb A1c MFr Bld: 6.9 % — ABNORMAL HIGH (ref <5.7)
MEAN PLASMA GLUCOSE: 151 mg/dL — AB (ref <117)

## 2013-11-23 LAB — TSH: TSH: 1.586 u[IU]/mL (ref 0.350–4.500)

## 2013-11-23 MED ORDER — MODAFINIL 100 MG PO TABS: 100.0000 mg | ORAL_TABLET | Freq: Every day | ORAL | Status: DC | PRN

## 2013-11-23 MED ORDER — CETIRIZINE HCL 10 MG PO TABS: 10.0000 mg | ORAL_TABLET | Freq: Every day | ORAL | Status: DC

## 2013-11-23 MED ORDER — MELOXICAM 7.5 MG PO TABS: 7.5000 mg | ORAL_TABLET | Freq: Every day | ORAL | Status: DC | PRN

## 2013-11-23 MED ORDER — MOMETASONE FUROATE 50 MCG/ACT NA SUSP: 2.0000 | Freq: Every day | NASAL | Status: DC

## 2013-11-23 MED ORDER — GABAPENTIN 100 MG PO CAPS: 100.0000 mg | ORAL_CAPSULE | Freq: Three times a day (TID) | ORAL | Status: DC | PRN

## 2013-11-23 MED ORDER — OMEPRAZOLE 40 MG PO CPDR: 40.0000 mg | DELAYED_RELEASE_CAPSULE | Freq: Two times a day (BID) | ORAL | Status: DC | PRN

## 2013-11-23 NOTE — Patient Instructions (Addendum)
64 oz of clear fluids Start a probiotic daily such as Digestive Advantage by [employer]  Diet and Irritable Bowel Syndrome  No cure has been found for irritable bowel syndrome (IBS). Many options are available to treat the symptoms. Your caregiver will give you the best treatments available for your symptoms. He or she will also encourage you to manage stress and to make changes to your diet. You need to work with your caregiver and Registered Dietician to find the best combination of medicine, diet, counseling, and support to control your symptoms. The following are some diet suggestions. FOODS THAT MAKE IBS WORSE  Fatty foods, such as [place] fries.  Milk products, such as cheese or ice cream.  Chocolate.  Alcohol.  Caffeine (found in coffee and some sodas).  Carbonated drinks, such as soda. If certain foods cause symptoms, you should eat less of them or stop eating them. FOOD JOURNAL   Keep a journal of the foods that seem to cause distress. Write down:  What you are eating during the day and when.  What problems you are having after eating.  When the symptoms occur in relation to your meals.  What foods always make you feel badly.  Take your notes with you to your caregiver to see if you should stop eating certain foods. FOODS THAT MAKE IBS BETTER Fiber reduces IBS symptoms, especially constipation, because it makes stools soft, bulky, and easier to pass. Fiber is found in bran, bread, cereal, beans, fruit, and vegetables. Examples of foods with fiber include:  Apples.  Peaches.  Pears.  Berries.  Figs.  Broccoli, raw.  Cabbage.  Carrots.  Raw peas.  Kidney beans.  Lima beans.  Whole-grain bread.  Whole-grain cereal. Add foods with fiber to your diet a little at a time. This will let your body get used to them. Too much fiber at once might cause gas and swelling of your abdomen. This can trigger symptoms in a person with IBS. Caregivers usually recommend a  diet with enough fiber to produce soft, painless bowel movements. High fiber diets may cause gas and bloating. However, these symptoms often go away within a few weeks, as your body adjusts. In many cases, dietary fiber may lessen IBS symptoms, particularly constipation. However, it may not help pain or diarrhea. High fiber diets keep the colon mildly enlarged (distended) with the added fiber. This may help prevent spasms in the colon. Some forms of fiber also keep water in the stool, thereby preventing hard stools that are difficult to pass.  Besides telling you to eat more foods with fiber, your caregiver may also tell you to get more fiber by taking a fiber pill or drinking water mixed with a special high fiber powder. An example of this is a natural fiber laxative containing psyllium seed.  TIPS  Large meals can cause cramping and diarrhea in people with IBS. If this happens to you, try eating 4 or 5 small meals a day, or try eating less at each of your usual 3 meals. It may also help if your meals are low in fat and high in carbohydrates. Examples of carbohydrates are pasta, rice, whole-grain breads and cereals, fruits, and vegetables.  If dairy products cause your symptoms to flare up, you can try eating less of those foods. You might be able to handle yogurt better than other dairy products, because it contains bacteria that helps with digestion. Dairy products are an important source of calcium and other nutrients. If you need  to avoid dairy products, be sure to talk with a Registered Dietitian about getting these nutrients through other food sources.  Drink enough water and fluids to keep your urine clear or pale yellow. This is important, especially if you have diarrhea. FOR MORE INFORMATION  International Foundation for Functional Gastrointestinal Disorders: www.iffgd.org  National Digestive Diseases Information Clearinghouse: digestive.StageSync.siniddk.nih.gov Document Released: 11/24/2003 Document  Revised: 11/26/2011 Document Reviewed: 08/11/2007 [facility] Patient Information 2014 [employer], [place].

## 2013-11-23 NOTE — Progress Notes (Signed)
Pre visit review using our clinic review tool, if applicable. No additional management support is needed unless otherwise documented below in the visit note. 

## 2013-11-23 NOTE — Progress Notes (Signed)
Patient ID: Chelsea Bray, female   DOB: [birth year], 43 y.o.   MRN: [id number] DORTHY MAGNUSSEN [id number] July 18, 1971 11/23/2013      Progress Note-Follow Up  Subjective  Chief Complaint  Chief Complaint  Patient presents with  . Annual Exam    Physical  . Injections    tdap    HPI  Patient is a 43 year old female in today for routine medical care. She has numerous concerns. struggles with chronic pain in neck. Has had recent increase in headches. Had a recent episode of chest pain, jaw ans shoulder pain but trip to ER did not confirm heart disease. Notes recent SOB with exertion but not associated with CP. Has some pedal edema as well. Has h/o sleep apnea and is supposed to be using CPAP but does not. Has hemorrhoids and has seen some blood with BM lately. Is noting some increased trouble with allergies and IBS. Is using Zyrtec D with some relief  Past Medical History  Diagnosis Date  . Hyperlipidemia   . Hypertension   . Morbid obesity 01/10/2013  . Acid reflux disease     Past Surgical History  Procedure Laterality Date  . Gallbladder surgery  1991    gall stone removed  . Cerebral microvascular decompression  2006    back of her head  . Breast reduction surgery  2001  . Cholecystectomy    . Mole removal      Family History  Problem Relation Age of Onset  . Breast cancer Neg Hx   . Heart disease Maternal Grandmother   . Arrhythmia Mother   . Diabetes Mother     maternal grandmother and aunts  . Hypertension Mother   . Heart attack Father   . Stroke Father   . Hyperlipidemia Father   . Hypertension Father   . Prostate cancer Maternal Grandfather   . Hypertension      parents and maternal parents    History   Social History  . Marital Status: Married    Spouse Name: N/A    Number of Children: N/A  . Years of Education: N/A   Occupational History  . Not on file.   Social History Main Topics  . Smoking status: Never Smoker   . Smokeless  tobacco: Never Used  . Alcohol Use: Yes     Comment: wine ocassion  . Drug Use: No  . Sexual Activity: Yes    Birth Control/ Protection: None   Other Topics Concern  . Not on file   Social History Narrative  . No narrative on file    Current Outpatient Prescriptions on File Prior to Visit  Medication Sig Dispense Refill  . albuterol (PROVENTIL HFA;VENTOLIN HFA) 108 (90 BASE) MCG/ACT inhaler Inhale 2 puffs into the lungs every 2 (two) hours as needed for wheezing or shortness of breath (cough).  1 Inhaler  0  . atorvastatin (LIPITOR) 10 MG tablet Take 10 mg by mouth daily.      Marland Kitchen atorvastatin (LIPITOR) 20 MG tablet TAKE 1 TABLET BY MOUTH DAILY  30 tablet  0  . Cholecalciferol (VITAMIN D3) 50000 UNITS CAPS Take 50,000 mg by mouth once a week.  5 capsule  5  . fluticasone (FLONASE) 50 MCG/ACT nasal spray Place 2 sprays into the nose daily.  16 g  6  . gabapentin (NEURONTIN) 100 MG capsule TAKE 1 CAPSULE BY MOUTH THREE TIMES DAILY  270 capsule  0  . hydrochlorothiazide (HYDRODIURIL) 12.5 MG tablet Take  1 tablet (12.5 mg total) by mouth daily.  30 tablet  6  . meloxicam (MOBIC) 7.5 MG tablet Take 1 tablet (7.5 mg total) by mouth daily.  15 tablet  0  . methocarbamol (ROBAXIN) 500 MG tablet Take 1 tablet (500 mg total) by mouth 2 (two) times daily as needed.  60 tablet  1  . modafinil (PROVIGIL) 100 MG tablet TAKE 1 TABLET BY MOUTH ONCE DAILY  90 tablet  0  . omeprazole (PRILOSEC) 40 MG capsule Take 40 mg by mouth 2 (two) times daily.      Marland Kitchen zolpidem (AMBIEN) 10 MG tablet Take 1 tablet (10 mg total) by mouth at bedtime as needed for sleep.  30 tablet  0   No current facility-administered medications on file prior to visit.    No Known Allergies  Review of Systems  Review of Systems  Constitutional: Negative for fever, chills and malaise/fatigue.  HENT: Negative for congestion, hearing loss and nosebleeds.   Eyes: Negative for discharge.  Respiratory: Negative for cough, sputum  production, shortness of breath and wheezing.   Cardiovascular: Negative for chest pain, palpitations and leg swelling.  Gastrointestinal: Negative for heartburn, nausea, vomiting, abdominal pain, diarrhea, constipation and blood in stool.  Genitourinary: Negative for dysuria, urgency, frequency and hematuria.  Musculoskeletal: Negative for back pain, falls and myalgias.  Skin: Negative for rash.  Neurological: Negative for dizziness, tremors, sensory change, focal weakness, loss of consciousness, weakness and headaches.  Endo/Heme/Allergies: Negative for polydipsia. Does not bruise/bleed easily.  Psychiatric/Behavioral: Negative for depression and suicidal ideas. The patient is not nervous/anxious and does not have insomnia.     Objective  BP 120/94  Pulse 83  Temp(Src) 98.4 F (36.9 C) (Oral)  Ht 5\' 5"  (1.651 m)  Wt 238 lb 1.9 oz (108.011 kg)  BMI 39.63 kg/m2  SpO2 97%  LMP 11/09/2013  Physical Exam  Physical Exam  Constitutional: She is oriented to person, place, and time and well-developed, well-nourished, and in no distress. No distress.  HENT:  Head: Normocephalic and atraumatic.  Right Ear: External ear normal.  Left Ear: External ear normal.  Nose: Nose normal.  Mouth/Throat: Oropharynx is clear and moist. No oropharyngeal exudate.  Eyes: Conjunctivae are normal. Pupils are equal, round, and reactive to light. Right eye exhibits no discharge. Left eye exhibits no discharge. No scleral icterus.  Neck: Normal range of motion. Neck supple. No thyromegaly present.  Cardiovascular: Normal rate, regular rhythm, normal heart sounds and intact distal pulses.   No murmur heard. Pulmonary/Chest: Effort normal and breath sounds normal. No respiratory distress. She has no wheezes. She has no rales.  Abdominal: Soft. Bowel sounds are normal. She exhibits no distension and no mass. There is no tenderness.  Musculoskeletal: Normal range of motion. She exhibits no edema and no  tenderness.  Lymphadenopathy:    She has no cervical adenopathy.  Neurological: She is alert and oriented to person, place, and time. She has normal reflexes. No cranial nerve deficit. Coordination normal.  Skin: Skin is warm and dry. No rash noted. She is not diaphoretic.  Psychiatric: Mood, memory and affect normal.    Lab Results  Component Value Date   TSH 1.116 01/06/2013   Lab Results  Component Value Date   WBC 7.2 06/13/2012   HGB 13.5 06/13/2012   HCT 40.2 06/13/2012   MCV 90.1 06/13/2012   PLT 350 06/13/2012   Lab Results  Component Value Date   CREATININE 0.87 06/13/2012   BUN 10  06/13/2012   NA 142 06/13/2012   K 4.4 06/13/2012   CL 105 06/13/2012   CO2 26 06/13/2012   Lab Results  Component Value Date   ALT 19 01/06/2013   AST 17 01/06/2013   ALKPHOS 49 01/06/2013   BILITOT 0.3 01/06/2013   Lab Results  Component Value Date   CHOL 170 06/13/2012   Lab Results  Component Value Date   HDL 39* 06/13/2012   Lab Results  Component Value Date   LDLCALC 109* 06/13/2012   Lab Results  Component Value Date   TRIG 112 06/13/2012   Lab Results  Component Value Date   CHOLHDL 4.4 06/13/2012     Assessment & Plan  Preventative health care Patient encouraged to maintain heart healthy diet, regular exercise, adequate sleep. Consider daily probiotics. Take medications as prescribed. Encouraged 3D MGM with next MGM. gien Tdap today  Morbid obesity Encouraged DASH diet, decrease po intake and increase exercise as tolerated. Needs 7-8 hours of sleep nightly. Avoid trans fats, eat small, frequent meals every 4-5 hours with lean proteins, complex carbs and healthy fats. Minimize simple carbs, GMO foods.  Unspecified essential hypertension Well controlled, no changes to meds. Encouraged heart healthy diet such as the DASH diet and exercise as tolerated. Minimize caffeine  Atypical chest pain Patient with some risk factors for CV disease. Will refer to cardiology for  consideration despite atypical nature of chest pain  Other and unspecified hyperlipidemia Tolerating statin, encouraged heart healthy diet, avoid trans fats, minimize simple carbs and saturated fats. Increase exercise as tolerated  Unspecified vitamin D deficiency wnl today  GERD (gastroesophageal reflux disease) Avoid offending foods, start probiotics. Do not eat large meals in late evening and consider raising head of bed. Continue PPI. scanat likely hemorrhoidal bleeding noted. Will likely benefit from referral to GI after her cardiac and pulmonary conditions are assessed and treated  Diabetes New onset a1c is 6.9, minimize simple carbs, increase exercise recheck in 3 months. Will offer nutrition referral and glucometer for monitoring.  OSA (obstructive sleep apnea) Will order new sleep study to get her started on CPAP again  Allergic state Switch to Zyrtec and a different nasal steroid as needed

## 2013-11-24 ENCOUNTER — Telehealth: Payer: Self-pay | Admitting: Family Medicine

## 2013-11-24 LAB — VITAMIN D 25 HYDROXY (VIT D DEFICIENCY, FRACTURES): Vit D, 25-Hydroxy: 38 ng/mL (ref 30–89)

## 2013-11-24 NOTE — Telephone Encounter (Signed)
Relevant patient education assigned to patient using Emmi. ° °

## 2013-11-29 ENCOUNTER — Encounter: Payer: Self-pay | Admitting: Family Medicine

## 2013-11-29 DIAGNOSIS — E559 Vitamin D deficiency, unspecified: Secondary | ICD-10-CM | POA: Insufficient documentation

## 2013-11-29 DIAGNOSIS — R0789 Other chest pain: Secondary | ICD-10-CM | POA: Insufficient documentation

## 2013-11-29 DIAGNOSIS — Z0000 Encounter for general adult medical examination without abnormal findings: Secondary | ICD-10-CM

## 2013-11-29 DIAGNOSIS — T7840XA Allergy, unspecified, initial encounter: Secondary | ICD-10-CM | POA: Insufficient documentation

## 2013-11-29 HISTORY — DX: Encounter for general adult medical examination without abnormal findings: Z00.00

## 2013-11-29 NOTE — Assessment & Plan Note (Signed)
Tolerating statin, encouraged heart healthy diet, avoid trans fats, minimize simple carbs and saturated fats. Increase exercise as tolerated 

## 2013-11-29 NOTE — Assessment & Plan Note (Addendum)
Avoid offending foods, start probiotics. Do not eat large meals in late evening and consider raising head of bed. Continue PPI. scanat likely hemorrhoidal bleeding noted. Will likely benefit from referral to GI after her cardiac and pulmonary conditions are assessed and treated

## 2013-11-29 NOTE — Assessment & Plan Note (Signed)
New onset a1c is 6.9, minimize simple carbs, increase exercise recheck in 3 months. Will offer nutrition referral and glucometer for monitoring.

## 2013-11-29 NOTE — Assessment & Plan Note (Signed)
Encouraged DASH diet, decrease po intake and increase exercise as tolerated. Needs 7-8 hours of sleep nightly. Avoid trans fats, eat small, frequent meals every 4-5 hours with lean proteins, complex carbs and healthy fats. Minimize simple carbs, GMO foods. 

## 2013-11-29 NOTE — Assessment & Plan Note (Signed)
Patient with some risk factors for CV disease. Will refer to cardiology for consideration despite atypical nature of chest pain

## 2013-11-29 NOTE — Assessment & Plan Note (Signed)
Patient encouraged to maintain heart healthy diet, regular exercise, adequate sleep. Consider daily probiotics. Take medications as prescribed. Encouraged 3D MGM with next MGM. gien Tdap today

## 2013-11-29 NOTE — Assessment & Plan Note (Signed)
wnl today 

## 2013-11-29 NOTE — Assessment & Plan Note (Signed)
Will order new sleep study to get her started on CPAP again

## 2013-11-29 NOTE — Assessment & Plan Note (Signed)
Well controlled, no changes to meds. Encouraged heart healthy diet such as the DASH diet and exercise as tolerated. Minimize caffeine

## 2013-11-29 NOTE — Assessment & Plan Note (Signed)
Switch to Zyrtec and a different nasal steroid as needed

## 2013-11-30 ENCOUNTER — Telehealth: Payer: Self-pay

## 2013-11-30 ENCOUNTER — Other Ambulatory Visit: Payer: Self-pay | Admitting: Family Medicine

## 2013-11-30 DIAGNOSIS — R739 Hyperglycemia, unspecified: Secondary | ICD-10-CM

## 2013-11-30 MED ORDER — FREESTYLE SYSTEM KIT: 1.0000 | PACK | Status: DC

## 2013-11-30 MED ORDER — GLUCOSE BLOOD VI STRP: ORAL_STRIP | Status: DC

## 2013-11-30 MED ORDER — FREESTYLE LANCETS MISC: Status: DC

## 2013-11-30 NOTE — Telephone Encounter (Signed)
I tried to call pt on her cell but her vm was full. I will send a message through [employer]

## 2013-11-30 NOTE — Telephone Encounter (Signed)
Message copied by Court JoyFREEMAN, Shaqueena Mauceri L on Mon Nov 30, 2013 11:09 AM ------      Message from: Danise EdgeBLYTH, STACEY A      Created: Sun Nov 29, 2013 12:13 PM       So her A1c was 6.9 which defines diabetes which is new but does not require meds. Offer her a referral to nutritionist and send in a glucometer and have her check her sugars in am a couple times a week and write it down, needs to repeat labs and bring in log at vist in 3 months. She can come in sooner if she wants to discuss further. ------

## 2013-12-15 ENCOUNTER — Encounter: Payer: Self-pay | Admitting: Cardiovascular Disease

## 2013-12-15 ENCOUNTER — Ambulatory Visit (INDEPENDENT_AMBULATORY_CARE_PROVIDER_SITE_OTHER): Payer: BC Managed Care – PPO | Admitting: Cardiovascular Disease

## 2013-12-15 VITALS — BP 130/80 | HR 98 | Ht 65.0 in | Wt 237.5 lb

## 2013-12-15 DIAGNOSIS — I10 Essential (primary) hypertension: Secondary | ICD-10-CM

## 2013-12-15 DIAGNOSIS — R079 Chest pain, unspecified: Secondary | ICD-10-CM

## 2013-12-15 DIAGNOSIS — R9431 Abnormal electrocardiogram [ECG] [EKG]: Secondary | ICD-10-CM

## 2013-12-15 NOTE — Progress Notes (Signed)
Patient ID: Chelsea Bray, female   DOB: 1971/08/22, 43 y.o.   MRN: [id number]   43 yo with HTN and elevated chol.  Has had a couple of months of atypical chest pain.  Pain is nonexertional Sharp and fleeting all over her chest  Lasts about 10 minutes at most and can have 3-4 x/week.  No history of CAD  Had echo at [facility] a few years ago that she indicates was ok.  Mild exertional dyspnea that sounds functional.  Compliant with BP meds but not statin Scared of long term side effects and she is a borderline diabetic and thinks statin will make it worse No associated palpitations diaphoresis or palpitations Has not had previous ETT  Baseline ECG abnormal due to LVH    ROS: Denies fever, malais, weight loss, blurry vision, decreased visual acuity, cough, sputum, SOB, hemoptysis, pleuritic pain, palpitaitons, heartburn, abdominal pain, melena, lower extremity edema, claudication, or rash.  All other systems reviewed and negative   General: Affect appropriate Overweight black female  HEENT: normal Neck supple with no adenopathy JVP normal no bruits no thyromegaly Lungs clear with no wheezing and good diaphragmatic motion Heart:  S1/S2 no murmur,rub, gallop or click PMI normal Abdomen: benighn, BS positve, no tenderness, no AAA no bruit.  No HSM or HJR Distal pulses intact with no bruits No edema Neuro non-focal Skin warm and dry No muscular weakness  Medications Current Outpatient Prescriptions  Medication Sig Dispense Refill  . atorvastatin (LIPITOR) 20 MG tablet TAKE 1 TABLET BY MOUTH DAILY  30 tablet  0  . cetirizine (ZYRTEC) 10 MG tablet Take 1 tablet (10 mg total) by mouth daily.  30 tablet  11  . Cholecalciferol (VITAMIN D3) 50000 UNITS CAPS Take 50,000 mg by mouth once a week.  5 capsule  5  . FREESTYLE LITE test strip USE AS DIRECTED  300 each  2  . gabapentin (NEURONTIN) 100 MG capsule Take 1 capsule (100 mg total) by mouth 3 (three) times daily as needed.  270 capsule  0    . glucose monitoring kit (FREESTYLE) monitoring kit 1 each by Does not apply route 3 (three) times a week. Or any monitor that insurance will cover  DX:790.29  1 each  0  . hydrochlorothiazide (HYDRODIURIL) 12.5 MG tablet Take 1 tablet (12.5 mg total) by mouth daily.  30 tablet  6  . Lancets (FREESTYLE) lancets DX: 790.29  100 each  0  . meloxicam (MOBIC) 7.5 MG tablet Take 1 tablet (7.5 mg total) by mouth daily as needed for pain.  15 tablet  0  . methocarbamol (ROBAXIN) 500 MG tablet Take 1 tablet (500 mg total) by mouth 2 (two) times daily as needed.  60 tablet  1  . modafinil (PROVIGIL) 100 MG tablet Take 1 tablet (100 mg total) by mouth daily as needed.  90 tablet  0  . mometasone (NASONEX) 50 MCG/ACT nasal spray Place 2 sprays into the nose daily.  17 g  12  . omeprazole (PRILOSEC) 40 MG capsule Take 1 capsule (40 mg total) by mouth 2 (two) times daily as needed.      . zolpidem (AMBIEN) 10 MG tablet Take 1 tablet (10 mg total) by mouth at bedtime as needed for sleep.  30 tablet  0   No current facility-administered medications for this visit.    Allergies Review of patient's allergies indicates no known allergies.  Family History: Family History  Problem Relation Age of Onset  . Breast  cancer Neg Hx   . Heart disease Maternal Grandmother     MI  . Stroke Maternal Grandmother   . Diabetes Maternal Grandmother   . Arrhythmia Mother   . Diabetes Mother     maternal grandmother and aunts  . Hypertension Mother   . Heart attack Father   . Stroke Father   . Hyperlipidemia Father   . Hypertension Father   . Cancer Father     lung, smoker  . Heart disease Father     CABG x 4  . Prostate cancer Maternal Grandfather   . Cancer Maternal Grandfather     colon cancer, prostate  . Hypertension      parents and maternal parents  . Alcohol abuse Paternal Grandmother     died of pneumonia  . Heart disease Paternal Grandfather     MI  . Asthma Daughter     allergies     Social History: History   Social History  . Marital Status: Married    Spouse Name: N/A    Number of Children: N/A  . Years of Education: N/A   Occupational History  . Not on file.   Social History Main Topics  . Smoking status: Never Smoker   . Smokeless tobacco: Never Used  . Alcohol Use: Yes     Comment: wine ocassion  . Drug Use: No  . Sexual Activity: Yes    Birth Control/ Protection: None     Comment: avoid dairy, lives with husband, daughters. works at school bus [employer]   Other Topics Concern  . Not on file   Social History Narrative  . No narrative on file    Electrocardiogram:  NSR LVH nonspecific ST/T wave changes   Assessment and Plan

## 2013-12-15 NOTE — Patient Instructions (Addendum)
Your physician recommends that you schedule a follow-up appointment in: AS NEEDED  Your physician recommends that you continue on your current medications as directed. Please refer to the Current Medication list given to you today. Your physician has requested that you have en exercise stress myoview. For further information please visit [email or web address]. Please follow instruction sheet, as given.  

## 2013-12-15 NOTE — Assessment & Plan Note (Signed)
Atypical  Will need stress myovue to r/o CAD given abnormal ECG from LVH

## 2013-12-15 NOTE — Assessment & Plan Note (Signed)
Well controlled.  Continue current medications and low sodium Dash type diet.    

## 2013-12-26 ENCOUNTER — Other Ambulatory Visit: Payer: Self-pay | Admitting: Family Medicine

## 2013-12-28 ENCOUNTER — Ambulatory Visit (HOSPITAL_COMMUNITY): Payer: BC Managed Care – PPO | Attending: Cardiovascular Disease | Admitting: Radiology

## 2013-12-28 ENCOUNTER — Other Ambulatory Visit: Payer: Self-pay | Admitting: Family Medicine

## 2013-12-28 ENCOUNTER — Encounter: Payer: Self-pay | Admitting: Cardiovascular Disease

## 2013-12-28 VITALS — BP 116/65 | Ht 65.0 in | Wt 238.0 lb

## 2013-12-28 DIAGNOSIS — Z8249 Family history of ischemic heart disease and other diseases of the circulatory system: Secondary | ICD-10-CM | POA: Insufficient documentation

## 2013-12-28 DIAGNOSIS — R9431 Abnormal electrocardiogram [ECG] [EKG]: Secondary | ICD-10-CM | POA: Insufficient documentation

## 2013-12-28 DIAGNOSIS — R002 Palpitations: Secondary | ICD-10-CM | POA: Insufficient documentation

## 2013-12-28 DIAGNOSIS — I10 Essential (primary) hypertension: Secondary | ICD-10-CM | POA: Insufficient documentation

## 2013-12-28 DIAGNOSIS — R0609 Other forms of dyspnea: Secondary | ICD-10-CM | POA: Insufficient documentation

## 2013-12-28 DIAGNOSIS — I779 Disorder of arteries and arterioles, unspecified: Secondary | ICD-10-CM | POA: Insufficient documentation

## 2013-12-28 DIAGNOSIS — R0989 Other specified symptoms and signs involving the circulatory and respiratory systems: Secondary | ICD-10-CM | POA: Insufficient documentation

## 2013-12-28 DIAGNOSIS — R079 Chest pain, unspecified: Secondary | ICD-10-CM | POA: Insufficient documentation

## 2013-12-28 DIAGNOSIS — R0602 Shortness of breath: Secondary | ICD-10-CM | POA: Insufficient documentation

## 2013-12-28 DIAGNOSIS — E785 Hyperlipidemia, unspecified: Secondary | ICD-10-CM | POA: Insufficient documentation

## 2013-12-28 MED ORDER — TECHNETIUM TC 99M SESTAMIBI GENERIC - CARDIOLITE
30.0000 | Freq: Once | INTRAVENOUS | Status: AC | PRN
  Administered 2013-12-28: 30 via INTRAVENOUS

## 2013-12-28 NOTE — Progress Notes (Signed)
  [facility] SITE 3 NUCLEAR MED [address] [place], [place] [address] [phone]    Cardiology Nuclear Med Study  Johnston EbbsJacquetta D Bray is a 43 y.o. female     MRN : [id number]     DOB: 11/21/1970  Procedure Date: 12/28/2013  Nuclear Med Background Indication for Stress Test:  Evaluation for Ischemia and Abnormal WUX:[id number]:Nonspecific STT changes History:  '07-'08 [facility], NL [facility] ECHO: EF: 65%, No Hx of  CAD Cardiac Risk Factors: Carotid Disease, Family History - CAD, Hypertension and Lipids  Symptoms:  Chest Pain, DOE, Palpitations and SOB   Nuclear Pre-Procedure Caffeine/Decaff Intake:  11:00pm NPO After: 6:30am   Lungs:  clear O2 Sat: 97% on room air. IV 0.9% NS with Angio Cath:  22g  IV Site: R Hand  IV Started by:  Cathlyn Parsonsynthia Hasspacher, RN  Chest Size (in):  40 Cup Size: D  Height: 5\' 5"  (1.651 m)  Weight:  238 lb (107.956 kg)  BMI:  Body mass index is 39.61 kg/(m^2). Tech Comments:  n/a    Nuclear Med Study 1 or 2 day study: 1 day  Stress Test Type:  Stress  Reading MD: n/a  Order Authorizing Provider:  Burna CashPeter Nishan,MD  Resting Radionuclide: Technetium 298m Sestamibi  Resting Radionuclide Dose: 33.0 mCi  12/31/13  Stress Radionuclide:  Technetium 788m Sestamibi  Stress Radionuclide Dose: 33.0 mCi 12/28/13          Stress Protocol Rest HR: 79 Stress HR: 173  Rest BP: 116/65 Stress BP: 177/87  Exercise Time (min): 6:31 METS: 7.70   Predicted Max HR: 177 bpm % Max HR: 97.74 bpm Rate Pressure Product: [id number]   Dose of Adenosine (mg):  n/a Dose of Lexiscan: n/a mg  Dose of Atropine (mg): n/a Dose of Dobutamine: n/a mcg/kg/min (at max HR)  Stress Test Technologist: Milana NaSabrina Williams, EMT-P  Nuclear Technologist:  Domenic PoliteStephen Carbone, CNMT     Rest Procedure:  Myocardial perfusion imaging was performed at rest 45 minutes following the intravenous administration of Technetium 268m Sestamibi. Rest ECG: NSR with non-specific ST-T wave  changes  Stress Procedure:  The patient exercised on the treadmill utilizing the Bruce Protocol for 6:31 minutes. The patient stopped due to fatigue and denied any chest tightness.  Technetium 728m Sestamibi was injected at peak exercise and myocardial perfusion imaging was performed after a brief delay. Stress ECG: No significant change from baseline ECG  QPS Raw Data Images:  Normal; no motion artifact; normal heart/lung ratio. Stress Images:  Normal homogeneous uptake in all areas of the myocardium. Rest Images:  Normal homogeneous uptake in all areas of the myocardium. Subtraction (SDS):  No evidence of ischemia. Transient Ischemic Dilatation (Normal <1.22):  0.92 Lung/Heart Ratio (Normal <0.45):  0.27  Quantitative Gated Spect Images QGS EDV:  82 ml QGS ESV:  32 ml  Impression Exercise Capacity:  Fair exercise capacity. BP Response:  Normal blood pressure response. Clinical Symptoms:  Mild chest pain/dyspnea. ECG Impression:  No significant ST segment change suggestive of ischemia. Comparison with Prior Nuclear Study: No images to compare  Overall Impression:  Normal stress nuclear study.  LV Ejection Fraction: 61%.  LV Wall Motion:  NL LV Function; NL Wall Motion  Chelsea Clementhomas Oskar Cretella  MD

## 2013-12-29 ENCOUNTER — Ambulatory Visit (HOSPITAL_BASED_OUTPATIENT_CLINIC_OR_DEPARTMENT_OTHER): Payer: BC Managed Care – PPO | Attending: Family Medicine

## 2013-12-31 ENCOUNTER — Ambulatory Visit (HOSPITAL_COMMUNITY): Payer: BC Managed Care – PPO | Attending: Cardiology

## 2013-12-31 DIAGNOSIS — R0989 Other specified symptoms and signs involving the circulatory and respiratory systems: Secondary | ICD-10-CM

## 2013-12-31 MED ORDER — TECHNETIUM TC 99M SESTAMIBI GENERIC - CARDIOLITE
30.0000 | Freq: Once | INTRAVENOUS | Status: AC | PRN
  Administered 2013-12-31: 30 via INTRAVENOUS

## 2014-03-22 ENCOUNTER — Ambulatory Visit: Payer: BC Managed Care – PPO | Admitting: Family Medicine

## 2014-03-22 ENCOUNTER — Telehealth: Payer: Self-pay | Admitting: Family Medicine

## 2014-03-22 DIAGNOSIS — Z0289 Encounter for other administrative examinations: Secondary | ICD-10-CM

## 2014-03-22 NOTE — Telephone Encounter (Signed)
Appointment NOS today °

## 2014-03-22 NOTE — Telephone Encounter (Signed)
Notify needs appt in next 3 months or so to follow up on last appt

## 2014-03-23 ENCOUNTER — Encounter: Payer: Self-pay | Admitting: Family Medicine

## 2014-03-23 NOTE — Telephone Encounter (Signed)
Left message for patient to return my call. No show letter sent.

## 2014-03-31 ENCOUNTER — Telehealth: Payer: Self-pay

## 2014-03-31 DIAGNOSIS — E785 Hyperlipidemia, unspecified: Secondary | ICD-10-CM

## 2014-03-31 NOTE — Telephone Encounter (Signed)
Diabetic bundle  mychart message sent to patient to have her come in for LDL labwork

## 2014-04-28 ENCOUNTER — Other Ambulatory Visit: Payer: Self-pay

## 2014-04-28 DIAGNOSIS — G4730 Sleep apnea, unspecified: Secondary | ICD-10-CM

## 2014-04-28 MED ORDER — MODAFINIL 100 MG PO TABS: 100.0000 mg | ORAL_TABLET | Freq: Every day | ORAL | Status: DC | PRN

## 2014-04-29 ENCOUNTER — Other Ambulatory Visit: Payer: Self-pay

## 2014-04-29 DIAGNOSIS — G4730 Sleep apnea, unspecified: Secondary | ICD-10-CM

## 2014-04-30 NOTE — Telephone Encounter (Signed)
Received paperwork for Modafinil, forward to nurse

## 2014-05-03 ENCOUNTER — Telehealth: Payer: Self-pay

## 2014-05-03 NOTE — Telephone Encounter (Signed)
[facility]. Copy sent to be scanned into chart

## 2014-05-03 NOTE — Telephone Encounter (Signed)
Called pharmacy to make sur they received fax for Modafinil on 8/12. The didn't receive it so I refaxed again on 8/17. The pharmacy is waiting on Prior authorization in order to fill medication. LDM

## 2014-05-14 NOTE — Telephone Encounter (Signed)
PA has been approved for Modofil effective 04-20-14 through 05-11-15  Copy sent to be scanned into chart and to pharmacy

## 2014-05-17 ENCOUNTER — Telehealth: Payer: Self-pay | Admitting: Family Medicine

## 2014-05-17 NOTE — Telephone Encounter (Signed)
Left patient a voicemail to give call back to schedule CPE.  Last CPE was 06/13/2012.

## 2014-11-17 ENCOUNTER — Other Ambulatory Visit: Payer: Self-pay | Admitting: Family Medicine

## 2014-11-17 NOTE — Telephone Encounter (Signed)
Last OV: 11/23/13  Pt needs an appointment.  Meds refused.   Spoke with patient.  Pt states her blood pressure has been elevated lately. Last BP: 160/94.  Pt needs refills on meds.  No appointments available with Dr.Blyth. Therefore, an appointment was scheduled with Esperanza RichtersEdward Saguier, PA-C for tomorrow at 9 am for elevated blood pressure.

## 2014-11-18 ENCOUNTER — Other Ambulatory Visit: Payer: Self-pay | Admitting: Medical

## 2014-11-18 ENCOUNTER — Encounter: Payer: Self-pay | Admitting: Medical

## 2014-11-18 ENCOUNTER — Ambulatory Visit (INDEPENDENT_AMBULATORY_CARE_PROVIDER_SITE_OTHER): Payer: BC Managed Care – PPO | Admitting: Medical

## 2014-11-18 VITALS — BP 150/90 | HR 94 | Temp 98.4°F | Ht 65.0 in | Wt 239.6 lb

## 2014-11-18 DIAGNOSIS — J309 Allergic rhinitis, unspecified: Secondary | ICD-10-CM | POA: Insufficient documentation

## 2014-11-18 DIAGNOSIS — J3089 Other allergic rhinitis: Secondary | ICD-10-CM

## 2014-11-18 DIAGNOSIS — I10 Essential (primary) hypertension: Secondary | ICD-10-CM | POA: Insufficient documentation

## 2014-11-18 LAB — COMPREHENSIVE METABOLIC PANEL
ALT: 21 U/L (ref 0–35)
AST: 18 U/L (ref 0–37)
Albumin: 4.2 g/dL (ref 3.5–5.2)
Alkaline Phosphatase: 54 U/L (ref 39–117)
BILIRUBIN TOTAL: 0.5 mg/dL (ref 0.2–1.2)
BUN: 11 mg/dL (ref 6–23)
CO2: 27 meq/L (ref 19–32)
Calcium: 9.4 mg/dL (ref 8.4–10.5)
Chloride: 103 mEq/L (ref 96–112)
Creatinine, Ser: 0.97 mg/dL (ref 0.40–1.20)
GFR: 80.07 mL/min (ref >60.00)
GLUCOSE: 123 mg/dL — AB (ref 70–99)
Potassium: 4 mEq/L (ref 3.5–5.1)
SODIUM: 137 meq/L (ref 135–145)
TOTAL PROTEIN: 7.7 g/dL (ref 6.0–8.3)

## 2014-11-18 MED ORDER — FLUTICASONE PROPIONATE 50 MCG/ACT NA SUSP: 2.0000 | Freq: Every day | NASAL | Status: DC

## 2014-11-18 MED ORDER — LORATADINE 10 MG PO TABS: 10.0000 mg | ORAL_TABLET | Freq: Every day | ORAL | Status: DC

## 2014-11-18 MED ORDER — HYDROCHLOROTHIAZIDE 12.5 MG PO TABS: 12.5000 mg | ORAL_TABLET | Freq: Every day | ORAL | Status: DC

## 2014-11-18 NOTE — Progress Notes (Signed)
Pre visit review using our clinic review tool, if applicable. No additional management support is needed unless otherwise documented below in the visit note. 

## 2014-11-18 NOTE — Progress Notes (Signed)
Subjective:    Patient ID: Chelsea Bray, female    DOB: [birth year], 44 y.o.   MRN: [id number]  HPI   Pt in with recent bp elevation. [facility]. They checked before procedure and was 160/94. Pt has been checking with mom machine over past 2 weeks. Her bp is 130/84 one time. Other time 136/94. She reports occasional transient ha last maybe  2 hours. She takes alleve and ha improve. NO nausea or vomiting. No visual changes. No gross motor or sensory  function deficitis. Ha about 5 days a week past 1 month. HA last 2 hours on average. Often over rt frontal sinus region.  Pt ran out of her hctz for about a month.  [place] location varies. Sometimes over her rt eye. Sometimes back of her head.  Pt states hx of MRI neck in 2014. Pt had post occipital craneictomy for Chiari decompression. No cord syrinx.  MRI of head done as well and negative.  Pt saw neurologist([place]) and had neurosurgeon in the past  Pt some nasal congestion last 2 weeks with pnd. Some sneezing. Ha more predominant in sinus. Rare in occiptal area.  Lmp feb, 2016.      Review of Systems  Constitutional: Negative for fever, chills, diaphoresis, activity change and fatigue.  HENT: Positive for congestion, sinus pressure and sneezing. Negative for ear pain, facial swelling, nosebleeds, rhinorrhea and trouble swallowing.        Rt frontal sinus pain at times.  Respiratory: Negative for cough, chest tightness and shortness of breath.   Cardiovascular: Negative for chest pain, palpitations and leg swelling.  Gastrointestinal: Negative for nausea, vomiting and abdominal pain.  Musculoskeletal: Negative for neck pain and neck stiffness.  Neurological: Positive for headaches. Negative for dizziness, tremors, seizures, syncope, facial asymmetry, speech difficulty, weakness, light-headedness and numbness.       Faint ha usually rt frontal region.  Psychiatric/Behavioral: Negative for behavioral  problems, confusion and agitation. The patient is not nervous/anxious.    Past Medical History  Diagnosis Date  . Hyperlipidemia   . Hypertension   . Morbid obesity 01/10/2013  . Acid reflux disease   . Preventative health care 11/29/2013  . Diabetes 11/17/2011    History   Social History  . Marital Status: Married    Spouse Name: N/A  . Number of Children: N/A  . Years of Education: N/A   Occupational History  . Not on file.   Social History Main Topics  . Smoking status: Never Smoker   . Smokeless tobacco: Never Used  . Alcohol Use: Yes     Comment: wine ocassion  . Drug Use: No  . Sexual Activity: Yes    Birth Control/ Protection: None     Comment: avoid dairy, lives with husband, daughters. works at school bus driver   Other Topics Concern  . Not on file   Social History Narrative    Past Surgical History  Procedure Laterality Date  . Gallbladder surgery  1991    gall stone removed  . Cerebral microvascular decompression  2006    back of her head  . Breast reduction surgery  2001  . Cholecystectomy    . Mole removal      Family History  Problem Relation Age of Onset  . Breast cancer Neg Hx   . Heart disease Maternal Grandmother     MI  . Stroke Maternal Grandmother   . Diabetes Maternal Grandmother   . Arrhythmia  Mother   . Diabetes Mother     maternal grandmother and aunts  . Hypertension Mother   . Heart attack Father   . Stroke Father   . Hyperlipidemia Father   . Hypertension Father   . Cancer Father     lung, smoker  . Heart disease Father     CABG x 4  . Prostate cancer Maternal Grandfather   . Cancer Maternal Grandfather     colon cancer, prostate  . Hypertension      parents and maternal parents  . Alcohol abuse Paternal Grandmother     died of pneumonia  . Heart disease Paternal Grandfather     MI  . Asthma Daughter     allergies    No Known Allergies  Current Outpatient Prescriptions on File Prior to Visit  Medication Sig  Dispense Refill  . atorvastatin (LIPITOR) 20 MG tablet TAKE 1 TABLET BY MOUTH DAILY (Patient not taking: Reported on 11/18/2014) 30 tablet 0  . cetirizine (ZYRTEC) 10 MG tablet Take 1 tablet (10 mg total) by mouth daily. 30 tablet 11  . Cholecalciferol (VITAMIN D3) 50000 UNITS CAPS Take 50,000 mg by mouth once a week. 5 capsule 5  . FREESTYLE LITE test strip USE AS DIRECTED (Patient not taking: Reported on 11/18/2014) 300 each 2  . gabapentin (NEURONTIN) 100 MG capsule Take 1 capsule (100 mg total) by mouth 3 (three) times daily as needed. (Patient not taking: Reported on 11/18/2014) 270 capsule 0  . glucose monitoring kit (FREESTYLE) monitoring kit 1 each by Does not apply route 3 (three) times a week. Or any monitor that insurance will cover  DX:790.29 (Patient not taking: Reported on 11/18/2014) 1 each 0  . Lancets (FREESTYLE) lancets DX: 790.29 (Patient not taking: Reported on 11/18/2014) 100 each 0  . meloxicam (MOBIC) 7.5 MG tablet Take 1 tablet (7.5 mg total) by mouth daily as needed for pain. (Patient not taking: Reported on 11/18/2014) 15 tablet 0  . methocarbamol (ROBAXIN) 500 MG tablet Take 1 tablet (500 mg total) by mouth 2 (two) times daily as needed. 60 tablet 1  . modafinil (PROVIGIL) 100 MG tablet Take 1 tablet (100 mg total) by mouth daily as needed. 90 tablet 0  . mometasone (NASONEX) 50 MCG/ACT nasal spray Place 2 sprays into the nose daily. (Patient not taking: Reported on 11/18/2014) 17 g 12  . omeprazole (PRILOSEC) 40 MG capsule Take 1 capsule (40 mg total) by mouth 2 (two) times daily as needed. (Patient not taking: Reported on 11/18/2014)    . Vitamin D, Ergocalciferol, (DRISDOL) 50000 UNITS CAPS capsule TAKE 1 CAPSULE BY MOUTH WEEKLY 12 capsule 0  . zolpidem (AMBIEN) 10 MG tablet Take 1 tablet (10 mg total) by mouth at bedtime as needed for sleep. (Patient not taking: Reported on 11/18/2014) 30 tablet 0   No current facility-administered medications on file prior to visit.    BP 150/90  mmHg  Pulse 94  Temp(Src) 98.4 F (36.9 C) (Oral)  Ht $R'5\' 5"'vQ$  (1.651 m)  Wt 239 lb 9.6 oz (108.682 kg)  BMI 39.87 kg/m2  SpO2 99%  LMP 10/25/2014       Objective:   Physical Exam    .General  Mental Status - Alert. General Appearance - Well groomed. Not in acute distress.  Skin Rashes- No Rashes.  Neck- from, supple, no carotid bruits.  HEENT Head- Normal. Ear Auditory Canal - Left- Normal. Right - Normal.Tympanic Membrane- Left- Normal. Right- Normal. Eye Sclera/Conjunctiva- Left- Normal.  Right- Normal. Nose & Sinuses Nasal Mucosa- Left-  Boggy and Congested. Right-  Boggy and  Congested.Bilateral no maxillary and no frontal sinus pressure.(at times some but not now) Mouth & Throat Lips: Upper Lip- Normal: no dryness, cracking, pallor, cyanosis, or vesicular eruption. Lower Lip-Normal: no dryness, cracking, pallor, cyanosis or vesicular eruption. Buccal Mucosa- Bilateral- No Aphthous ulcers. Oropharynx- No Discharge or Erythema. Tonsils: Characteristics- Bilateral- No Erythema or Congestion. Size/Enlargement- Bilateral- No enlargement. Discharge- bilateral-None.  Neck Neck- Supple. No Masses.   Chest and Lung Exam Auscultation: Breath Sounds:-Clear even and unlabored.  Cardiovascular Auscultation:Rythm- Regular, rate and rhythm. Murmurs & Other Heart Sounds:Ausculatation of the heart reveal- No Murmurs.  Lymphatic Head & Neck General Head & Neck Lymphatics: Bilateral: Description- No Localized lymphadenopathy.    Neurologic Cranial Nerve exam:- CN III-XII intact(No nystagmus), symmetric smile. Drift Test:- No drift. Romberg Exam:- Negative.  Heal to Toe Gait exam:-Normal. Finger to Nose:- Normal/Intact Strength:- 5/5 equal and symmetric strength both upper and lower extremities.         Assessment & Plan:

## 2014-11-18 NOTE — Assessment & Plan Note (Signed)
Refill hctz today. Get bmp. Check bp daily with machine. If bp is not less than 140/90 then will rx lisinopril.

## 2014-11-18 NOTE — Assessment & Plan Note (Signed)
-   rx claritin and flonase

## 2014-11-18 NOTE — Patient Instructions (Signed)
HTN (hypertension) Refill hctz today. Get bmp. Check bp daily with machine. If bp is not less than 140/90 then will rx lisinopril.   Allergic rhinitis rx claritin and flonase.     I think ha are probable related to both. Will follow closely. If you get ha with neurologic signs or symptoms then ED evaluation.  Follow up in 2-3 weeks for bp check. Bring you bp log readings in as well.

## 2014-12-14 ENCOUNTER — Encounter: Payer: BC Managed Care – PPO | Admitting: Medical

## 2014-12-22 ENCOUNTER — Telehealth: Payer: Self-pay

## 2014-12-22 NOTE — Telephone Encounter (Signed)
Called for Pre visit questionnaire with no answer. VM not set up to leave a msg.

## 2014-12-23 ENCOUNTER — Other Ambulatory Visit (HOSPITAL_COMMUNITY)
Admission: RE | Admit: 2014-12-23 | Discharge: 2014-12-23 | Disposition: A | Discharge status: Home / Self Care | Payer: BC Managed Care – PPO | Source: Ambulatory Visit | Attending: Family Medicine | Admitting: Family Medicine

## 2014-12-23 ENCOUNTER — Ambulatory Visit (INDEPENDENT_AMBULATORY_CARE_PROVIDER_SITE_OTHER): Payer: BC Managed Care – PPO | Admitting: Family Medicine

## 2014-12-23 ENCOUNTER — Encounter: Payer: Self-pay | Admitting: Family Medicine

## 2014-12-23 VITALS — BP 158/101 | HR 78 | Temp 98.4°F | Resp 18 | Ht 65.5 in | Wt 238.0 lb

## 2014-12-23 DIAGNOSIS — Z113 Encounter for screening for infections with a predominantly sexual mode of transmission: Secondary | ICD-10-CM | POA: Insufficient documentation

## 2014-12-23 DIAGNOSIS — N760 Acute vaginitis: Secondary | ICD-10-CM

## 2014-12-23 DIAGNOSIS — G4733 Obstructive sleep apnea (adult) (pediatric): Secondary | ICD-10-CM

## 2014-12-23 DIAGNOSIS — I10 Essential (primary) hypertension: Secondary | ICD-10-CM | POA: Diagnosis not present

## 2014-12-23 DIAGNOSIS — E559 Vitamin D deficiency, unspecified: Secondary | ICD-10-CM

## 2014-12-23 DIAGNOSIS — Z0000 Encounter for general adult medical examination without abnormal findings: Secondary | ICD-10-CM

## 2014-12-23 DIAGNOSIS — K219 Gastro-esophageal reflux disease without esophagitis: Secondary | ICD-10-CM | POA: Diagnosis not present

## 2014-12-23 DIAGNOSIS — J3089 Other allergic rhinitis: Secondary | ICD-10-CM

## 2014-12-23 DIAGNOSIS — R739 Hyperglycemia, unspecified: Secondary | ICD-10-CM

## 2014-12-23 DIAGNOSIS — E782 Mixed hyperlipidemia: Secondary | ICD-10-CM

## 2014-12-23 DIAGNOSIS — R7309 Other abnormal glucose: Secondary | ICD-10-CM

## 2014-12-23 HISTORY — DX: Acute vaginitis: N76.0

## 2014-12-23 LAB — COMPREHENSIVE METABOLIC PANEL
ALBUMIN: 4.2 g/dL (ref 3.5–5.2)
ALT: 23 U/L (ref 0–35)
AST: 17 U/L (ref 0–37)
Alkaline Phosphatase: 57 U/L (ref 39–117)
BUN: 11 mg/dL (ref 6–23)
CO2: 30 mEq/L (ref 19–32)
Calcium: 9.8 mg/dL (ref 8.4–10.5)
Chloride: 104 mEq/L (ref 96–112)
Creatinine, Ser: 0.93 mg/dL (ref 0.40–1.20)
GFR: 84.02 mL/min (ref >60.00)
GLUCOSE: 126 mg/dL — AB (ref 70–99)
POTASSIUM: 4 meq/L (ref 3.5–5.1)
SODIUM: 140 meq/L (ref 135–145)
Total Bilirubin: 0.4 mg/dL (ref 0.2–1.2)
Total Protein: 7.7 g/dL (ref 6.0–8.3)

## 2014-12-23 LAB — CBC
HEMATOCRIT: 42.5 % (ref 36.0–46.0)
Hemoglobin: 14.3 g/dL (ref 12.0–15.0)
MCHC: 33.7 g/dL (ref 30.0–36.0)
MCV: 91.8 fl (ref 78.0–100.0)
PLATELETS: 346 10*3/uL (ref 150.0–400.0)
RBC: 4.63 Mil/uL (ref 3.87–5.11)
RDW: 14.2 % (ref 11.5–15.5)
WBC: 7.2 10*3/uL (ref 4.0–10.5)

## 2014-12-23 LAB — LIPID PANEL
CHOLESTEROL: 216 mg/dL — AB (ref 0–200)
HDL: 43.4 mg/dL (ref >39.00)
LDL Cholesterol: 150 mg/dL — ABNORMAL HIGH (ref 0–99)
NonHDL: 172.6
TRIGLYCERIDES: 112 mg/dL (ref 0.0–149.0)
Total CHOL/HDL Ratio: 5
VLDL: 22.4 mg/dL (ref 0.0–40.0)

## 2014-12-23 LAB — VITAMIN D 25 HYDROXY (VIT D DEFICIENCY, FRACTURES): VITD: 17.66 ng/mL — ABNORMAL LOW (ref 30.00–100.00)

## 2014-12-23 LAB — TSH: TSH: 1.42 u[IU]/mL (ref 0.35–4.50)

## 2014-12-23 LAB — HEMOGLOBIN A1C: Hgb A1c MFr Bld: 7.2 % — ABNORMAL HIGH (ref 4.6–6.5)

## 2014-12-23 MED ORDER — ESCITALOPRAM OXALATE 10 MG PO TABS: 10.0000 mg | ORAL_TABLET | Freq: Every day | ORAL | Status: DC

## 2014-12-23 NOTE — Assessment & Plan Note (Signed)
Avoid offending foods, start probiotics. Do not eat large meals in late evening and consider raising head of bed.  

## 2014-12-23 NOTE — Assessment & Plan Note (Signed)
hgba1c acceptable, minimize simple carbs. Increase exercise as tolerated. Continue current meds. Will repeat A1C today

## 2014-12-23 NOTE — Assessment & Plan Note (Signed)
Poorly controlled will alter medications, encouraged DASH diet, minimize caffeine and obtain adequate sleep. Report concerning symptoms and follow up as directed and as needed 

## 2014-12-23 NOTE — Patient Instructions (Addendum)
Moist heat and stretch, salon pas patches or gel   Increase hydration to 64 oz. No blue lights,  Melatonin 2-10 mg at bed  Preventive Care for Adults A healthy lifestyle and preventive care can promote health and wellness. Preventive health guidelines for women include the following key practices.  A routine yearly physical is a good way to check with your health care provider about your health and preventive screening. It is a chance to share any concerns and updates on your health and to receive a thorough exam.  Visit your dentist for a routine exam and preventive care every 6 months. Brush your teeth twice a day and floss once a day. Good oral hygiene prevents tooth decay and gum disease.  The frequency of eye exams is based on your age, health, family medical history, use of contact lenses, and other factors. Follow your health care provider's recommendations for frequency of eye exams.  Eat a healthy diet. Foods like vegetables, fruits, whole grains, low-fat dairy products, and lean protein foods contain the nutrients you need without too many calories. Decrease your intake of foods high in solid fats, added sugars, and salt. Eat the right amount of calories for you.Get information about a proper diet from your health care provider, if necessary.  Regular physical exercise is one of the most important things you can do for your health. Most adults should get at least 150 minutes of moderate-intensity exercise (any activity that increases your heart rate and causes you to sweat) each week. In addition, most adults need muscle-strengthening exercises on 2 or more days a week.  Maintain a healthy weight. The body mass index (BMI) is a screening tool to identify possible weight problems. It provides an estimate of body fat based on height and weight. Your health care provider can find your BMI and can help you achieve or maintain a healthy weight.For adults 20 years and older:  A BMI below  18.5 is considered underweight.  A BMI of 18.5 to 24.9 is normal.  A BMI of 25 to 29.9 is considered overweight.  A BMI of 30 and above is considered obese.  Maintain normal blood lipids and cholesterol levels by exercising and minimizing your intake of saturated fat. Eat a balanced diet with plenty of fruit and vegetables. Blood tests for lipids and cholesterol should begin at age 19 and be repeated every 5 years. If your lipid or cholesterol levels are high, you are over 50, or you are at high risk for heart disease, you may need your cholesterol levels checked more frequently.Ongoing high lipid and cholesterol levels should be treated with medicines if diet and exercise are not working.  If you smoke, find out from your health care provider how to quit. If you do not use tobacco, do not start.  Lung cancer screening is recommended for adults aged 7-80 years who are at high risk for developing lung cancer because of a history of smoking. A yearly low-dose CT scan of the lungs is recommended for people who have at least a 30-pack-year history of smoking and are a current smoker or have quit within the past 15 years. A pack year of smoking is smoking an average of 1 pack of cigarettes a day for 1 year (for example: 1 pack a day for 30 years or 2 packs a day for 15 years). Yearly screening should continue until the smoker has stopped smoking for at least 15 years. Yearly screening should be stopped for people who  develop a health problem that would prevent them from having lung cancer treatment.  If you are pregnant, do not drink alcohol. If you are breastfeeding, be very cautious about drinking alcohol. If you are not pregnant and choose to drink alcohol, do not have more than 1 drink per day. One drink is considered to be 12 ounces (355 mL) of beer, 5 ounces (148 mL) of wine, or 1.5 ounces (44 mL) of liquor.  Avoid use of street drugs. Do not share needles with anyone. Ask for help if you need  support or instructions about stopping the use of drugs.  High blood pressure causes heart disease and increases the risk of stroke. Your blood pressure should be checked at least every 1 to 2 years. Ongoing high blood pressure should be treated with medicines if weight loss and exercise do not work.  If you are 47-48 years old, ask your health care provider if you should take aspirin to prevent strokes.  Diabetes screening involves taking a blood sample to check your fasting blood sugar level. This should be done once every 3 years, after age 17, if you are within normal weight and without risk factors for diabetes. Testing should be considered at a younger age or be carried out more frequently if you are overweight and have at least 1 risk factor for diabetes.  Breast cancer screening is essential preventive care for women. You should practice "breast self-awareness." This means understanding the normal appearance and feel of your breasts and may include breast self-examination. Any changes detected, no matter how small, should be reported to a health care provider. Women in their 57s and 30s should have a clinical breast exam (CBE) by a health care provider as part of a regular health exam every 1 to 3 years. After age 45, women should have a CBE every year. Starting at age 66, women should consider having a mammogram (breast X-ray test) every year. Women who have a family history of breast cancer should talk to their health care provider about genetic screening. Women at a high risk of breast cancer should talk to their health care providers about having an MRI and a mammogram every year.  Breast cancer gene (BRCA)-related cancer risk assessment is recommended for women who have family members with BRCA-related cancers. BRCA-related cancers include breast, ovarian, tubal, and peritoneal cancers. Having family members with these cancers may be associated with an increased risk for harmful changes  (mutations) in the breast cancer genes BRCA1 and BRCA2. Results of the assessment will determine the need for genetic counseling and BRCA1 and BRCA2 testing.  Routine pelvic exams to screen for cancer are no longer recommended for nonpregnant women who are considered low risk for cancer of the pelvic organs (ovaries, uterus, and vagina) and who do not have symptoms. Ask your health care provider if a screening pelvic exam is right for you.  If you have had past treatment for cervical cancer or a condition that could lead to cancer, you need Pap tests and screening for cancer for at least 20 years after your treatment. If Pap tests have been discontinued, your risk factors (such as having a new sexual partner) need to be reassessed to determine if screening should be resumed. Some women have medical problems that increase the chance of getting cervical cancer. In these cases, your health care provider may recommend more frequent screening and Pap tests.  The HPV test is an additional test that may be used for cervical cancer  screening. The HPV test looks for the virus that can cause the cell changes on the cervix. The cells collected during the Pap test can be tested for HPV. The HPV test could be used to screen women aged 53 years and older, and should be used in women of any age who have unclear Pap test results. After the age of 70, women should have HPV testing at the same frequency as a Pap test.  Colorectal cancer can be detected and often prevented. Most routine colorectal cancer screening begins at the age of 65 years and continues through age 17 years. However, your health care provider may recommend screening at an earlier age if you have risk factors for colon cancer. On a yearly basis, your health care provider may provide home test kits to check for hidden blood in the stool. Use of a small camera at the end of a tube, to directly examine the colon (sigmoidoscopy or colonoscopy), can detect the  earliest forms of colorectal cancer. Talk to your health care provider about this at age 65, when routine screening begins. Direct exam of the colon should be repeated every 5-10 years through age 68 years, unless early forms of pre-cancerous polyps or small growths are found.  People who are at an increased risk for hepatitis B should be screened for this virus. You are considered at high risk for hepatitis B if:  You were born in a country where hepatitis B occurs often. Talk with your health care provider about which countries are considered high risk.  Your parents were born in a high-risk country and you have not received a shot to protect against hepatitis B (hepatitis B vaccine).  You have HIV or AIDS.  You use needles to inject street drugs.  You live with, or have sex with, someone who has hepatitis B.  You get hemodialysis treatment.  You take certain medicines for conditions like cancer, organ transplantation, and autoimmune conditions.  Hepatitis C blood testing is recommended for all people born from [birth year] through 1965 and any individual with known risks for hepatitis C.  Practice safe sex. Use condoms and avoid high-risk sexual practices to reduce the spread of sexually transmitted infections (STIs). STIs include gonorrhea, chlamydia, syphilis, trichomonas, herpes, HPV, and human immunodeficiency virus (HIV). Herpes, HIV, and HPV are viral illnesses that have no cure. They can result in disability, cancer, and death.  You should be screened for sexually transmitted illnesses (STIs) including gonorrhea and chlamydia if:  You are sexually active and are younger than 24 years.  You are older than 24 years and your health care provider tells you that you are at risk for this type of infection.  Your sexual activity has changed since you were last screened and you are at an increased risk for chlamydia or gonorrhea. Ask your health care provider if you are at risk.  If you are  at risk of being infected with HIV, it is recommended that you take a prescription medicine daily to prevent HIV infection. This is called preexposure prophylaxis (PrEP). You are considered at risk if:  You are a heterosexual woman, are sexually active, and are at increased risk for HIV infection.  You take drugs by injection.  You are sexually active with a partner who has HIV.  Talk with your health care provider about whether you are at high risk of being infected with HIV. If you choose to begin PrEP, you should first be tested for HIV. You should then  be tested every 3 months for as long as you are taking PrEP.  Osteoporosis is a disease in which the bones lose minerals and strength with aging. This can result in serious bone fractures or breaks. The risk of osteoporosis can be identified using a bone density scan. Women ages 69 years and over and women at risk for fractures or osteoporosis should discuss screening with their health care providers. Ask your health care provider whether you should take a calcium supplement or vitamin D to reduce the rate of osteoporosis.  Menopause can be associated with physical symptoms and risks. Hormone replacement therapy is available to decrease symptoms and risks. You should talk to your health care provider about whether hormone replacement therapy is right for you.  Use sunscreen. Apply sunscreen liberally and repeatedly throughout the day. You should seek shade when your shadow is shorter than you. Protect yourself by wearing long sleeves, pants, a wide-brimmed hat, and sunglasses year round, whenever you are outdoors.  Once a month, do a whole body skin exam, using a mirror to look at the skin on your back. Tell your health care provider of new moles, moles that have irregular borders, moles that are larger than a pencil eraser, or moles that have changed in shape or color.  Stay current with required vaccines (immunizations).  Influenza vaccine.  All adults should be immunized every year.  Tetanus, diphtheria, and acellular pertussis (Td, Tdap) vaccine. Pregnant women should receive 1 dose of Tdap vaccine during each pregnancy. The dose should be obtained regardless of the length of time since the last dose. Immunization is preferred during the 27th-36th week of gestation. An adult who has not previously received Tdap or who does not know her vaccine status should receive 1 dose of Tdap. This initial dose should be followed by tetanus and diphtheria toxoids (Td) booster doses every 10 years. Adults with an unknown or incomplete history of completing a 3-dose immunization series with Td-containing vaccines should begin or complete a primary immunization series including a Tdap dose. Adults should receive a Td booster every 10 years.  Varicella vaccine. An adult without evidence of immunity to varicella should receive 2 doses or a second dose if she has previously received 1 dose. Pregnant females who do not have evidence of immunity should receive the first dose after pregnancy. This first dose should be obtained before leaving the health care facility. The second dose should be obtained 4-8 weeks after the first dose.  Human papillomavirus (HPV) vaccine. Females aged 13-26 years who have not received the vaccine previously should obtain the 3-dose series. The vaccine is not recommended for use in pregnant females. However, pregnancy testing is not needed before receiving a dose. If a female is found to be pregnant after receiving a dose, no treatment is needed. In that case, the remaining doses should be delayed until after the pregnancy. Immunization is recommended for any person with an immunocompromised condition through the age of 41 years if she did not get any or all doses earlier. During the 3-dose series, the second dose should be obtained 4-8 weeks after the first dose. The third dose should be obtained 24 weeks after the first dose and 16  weeks after the second dose.  Zoster vaccine. One dose is recommended for adults aged 44 years or older unless certain conditions are present.  Measles, mumps, and rubella (MMR) vaccine. Adults born before [birth year] generally are considered immune to measles and mumps. Adults born in [birth year] or  later should have 1 or more doses of MMR vaccine unless there is a contraindication to the vaccine or there is laboratory evidence of immunity to each of the three diseases. A routine second dose of MMR vaccine should be obtained at least 28 days after the first dose for students attending postsecondary schools, health care workers, or international travelers. People who received inactivated measles vaccine or an unknown type of measles vaccine during 1963-1967 should receive 2 doses of MMR vaccine. People who received inactivated mumps vaccine or an unknown type of mumps vaccine before 1979 and are at high risk for mumps infection should consider immunization with 2 doses of MMR vaccine. For females of childbearing age, rubella immunity should be determined. If there is no evidence of immunity, females who are not pregnant should be vaccinated. If there is no evidence of immunity, females who are pregnant should delay immunization until after pregnancy. Unvaccinated health care workers born before [birth year] who lack laboratory evidence of measles, mumps, or rubella immunity or laboratory confirmation of disease should consider measles and mumps immunization with 2 doses of MMR vaccine or rubella immunization with 1 dose of MMR vaccine.  Pneumococcal 13-valent conjugate (PCV13) vaccine. When indicated, a person who is uncertain of her immunization history and has no record of immunization should receive the PCV13 vaccine. An adult aged 27 years or older who has certain medical conditions and has not been previously immunized should receive 1 dose of PCV13 vaccine. This PCV13 should be followed with a dose of pneumococcal  polysaccharide (PPSV23) vaccine. The PPSV23 vaccine dose should be obtained at least 8 weeks after the dose of PCV13 vaccine. An adult aged 56 years or older who has certain medical conditions and previously received 1 or more doses of PPSV23 vaccine should receive 1 dose of PCV13. The PCV13 vaccine dose should be obtained 1 or more years after the last PPSV23 vaccine dose.  Pneumococcal polysaccharide (PPSV23) vaccine. When PCV13 is also indicated, PCV13 should be obtained first. All adults aged 24 years and older should be immunized. An adult younger than age 10 years who has certain medical conditions should be immunized. Any person who resides in a nursing home or long-term care facility should be immunized. An adult smoker should be immunized. People with an immunocompromised condition and certain other conditions should receive both PCV13 and PPSV23 vaccines. People with human immunodeficiency virus (HIV) infection should be immunized as soon as possible after diagnosis. Immunization during chemotherapy or radiation therapy should be avoided. Routine use of PPSV23 vaccine is not recommended for American Indians, [place] Natives, or people younger than 65 years unless there are medical conditions that require PPSV23 vaccine. When indicated, people who have unknown immunization and have no record of immunization should receive PPSV23 vaccine. One-time revaccination 5 years after the first dose of PPSV23 is recommended for people aged 19-64 years who have chronic kidney failure, nephrotic syndrome, asplenia, or immunocompromised conditions. People who received 1-2 doses of PPSV23 before age 21 years should receive another dose of PPSV23 vaccine at age 75 years or later if at least 5 years have passed since the previous dose. Doses of PPSV23 are not needed for people immunized with PPSV23 at or after age 45 years.  Meningococcal vaccine. Adults with asplenia or persistent complement component deficiencies  should receive 2 doses of quadrivalent meningococcal conjugate (MenACWY-D) vaccine. The doses should be obtained at least 2 months apart. Microbiologists working with certain meningococcal bacteria, [employer] recruits, people at risk during an  outbreak, and people who travel to or live in countries with a high rate of meningitis should be immunized. A first-year college student up through age 63 years who is living in a residence hall should receive a dose if she did not receive a dose on or after her 16th birthday. Adults who have certain high-risk conditions should receive one or more doses of vaccine.  Hepatitis A vaccine. Adults who wish to be protected from this disease, have certain high-risk conditions, work with hepatitis A-infected animals, work in hepatitis A research labs, or travel to or work in countries with a high rate of hepatitis A should be immunized. Adults who were previously unvaccinated and who anticipate close contact with an international adoptee during the first 60 days after arrival in the [place] from a country with a high rate of hepatitis A should be immunized.  Hepatitis B vaccine. Adults who wish to be protected from this disease, have certain high-risk conditions, may be exposed to blood or other infectious body fluids, are household contacts or sex partners of hepatitis B positive people, are clients or workers in certain care facilities, or travel to or work in countries with a high rate of hepatitis B should be immunized.  Haemophilus influenzae type b (Hib) vaccine. A previously unvaccinated person with asplenia or sickle cell disease or having a scheduled splenectomy should receive 1 dose of Hib vaccine. Regardless of previous immunization, a recipient of a hematopoietic stem cell transplant should receive a 3-dose series 6-12 months after her successful transplant. Hib vaccine is not recommended for adults with HIV infection. Preventive Services / Frequency Ages 29  to 34 years  Blood pressure check.** / Every 1 to 2 years.  Lipid and cholesterol check.** / Every 5 years beginning at age 71.  Clinical breast exam.** / Every 3 years for women in their 34s and 67s.  BRCA-related cancer risk assessment.** / For women who have family members with a BRCA-related cancer (breast, ovarian, tubal, or peritoneal cancers).  Pap test.** / Every 2 years from ages 58 through 30. Every 3 years starting at age 9 through age 24 or 21 with a history of 3 consecutive normal Pap tests.  HPV screening.** / Every 3 years from ages 71 through ages 38 to 67 with a history of 3 consecutive normal Pap tests.  Hepatitis C blood test.** / For any individual with known risks for hepatitis C.  Skin self-exam. / Monthly.  Influenza vaccine. / Every year.  Tetanus, diphtheria, and acellular pertussis (Tdap, Td) vaccine.** / Consult your health care provider. Pregnant women should receive 1 dose of Tdap vaccine during each pregnancy. 1 dose of Td every 10 years.  Varicella vaccine.** / Consult your health care provider. Pregnant females who do not have evidence of immunity should receive the first dose after pregnancy.  HPV vaccine. / 3 doses over 6 months, if 59 and younger. The vaccine is not recommended for use in pregnant females. However, pregnancy testing is not needed before receiving a dose.  Measles, mumps, rubella (MMR) vaccine.** / You need at least 1 dose of MMR if you were born in 1957 or later. You may also need a 2nd dose. For females of childbearing age, rubella immunity should be determined. If there is no evidence of immunity, females who are not pregnant should be vaccinated. If there is no evidence of immunity, females who are pregnant should delay immunization until after pregnancy.  Pneumococcal 13-valent conjugate (PCV13) vaccine.** / Consult your  health care provider.  Pneumococcal polysaccharide (PPSV23) vaccine.** / 1 to 2 doses if you smoke cigarettes  or if you have certain conditions.  Meningococcal vaccine.** / 1 dose if you are age 7 to 66 years and a Market researcher living in a residence hall, or have one of several medical conditions, you need to get vaccinated against meningococcal disease. You may also need additional booster doses.  Hepatitis A vaccine.** / Consult your health care provider.  Hepatitis B vaccine.** / Consult your health care provider.  Haemophilus influenzae type b (Hib) vaccine.** / Consult your health care provider. Ages 80 to 54 years  Blood pressure check.** / Every 1 to 2 years.  Lipid and cholesterol check.** / Every 5 years beginning at age 60 years.  Lung cancer screening. / Every year if you are aged 42-80 years and have a 30-pack-year history of smoking and currently smoke or have quit within the past 15 years. Yearly screening is stopped once you have quit smoking for at least 15 years or develop a health problem that would prevent you from having lung cancer treatment.  Clinical breast exam.** / Every year after age 46 years.  BRCA-related cancer risk assessment.** / For women who have family members with a BRCA-related cancer (breast, ovarian, tubal, or peritoneal cancers).  Mammogram.** / Every year beginning at age 56 years and continuing for as long as you are in good health. Consult with your health care provider.  Pap test.** / Every 3 years starting at age 68 years through age 28 or 78 years with a history of 3 consecutive normal Pap tests.  HPV screening.** / Every 3 years from ages 4 years through ages 50 to 100 years with a history of 3 consecutive normal Pap tests.  Fecal occult blood test (FOBT) of stool. / Every year beginning at age 14 years and continuing until age 25 years. You may not need to do this test if you get a colonoscopy every 10 years.  Flexible sigmoidoscopy or colonoscopy.** / Every 5 years for a flexible sigmoidoscopy or every 10 years for a colonoscopy  beginning at age 30 years and continuing until age 50 years.  Hepatitis C blood test.** / For all people born from [birth year] through 1966 and any individual with known risks for hepatitis C.  Skin self-exam. / Monthly.  Influenza vaccine. / Every year.  Tetanus, diphtheria, and acellular pertussis (Tdap/Td) vaccine.** / Consult your health care provider. Pregnant women should receive 1 dose of Tdap vaccine during each pregnancy. 1 dose of Td every 10 years.  Varicella vaccine.** / Consult your health care provider. Pregnant females who do not have evidence of immunity should receive the first dose after pregnancy.  Zoster vaccine.** / 1 dose for adults aged 42 years or older.  Measles, mumps, rubella (MMR) vaccine.** / You need at least 1 dose of MMR if you were born in 1958 or later. You may also need a 2nd dose. For females of childbearing age, rubella immunity should be determined. If there is no evidence of immunity, females who are not pregnant should be vaccinated. If there is no evidence of immunity, females who are pregnant should delay immunization until after pregnancy.  Pneumococcal 13-valent conjugate (PCV13) vaccine.** / Consult your health care provider.  Pneumococcal polysaccharide (PPSV23) vaccine.** / 1 to 2 doses if you smoke cigarettes or if you have certain conditions.  Meningococcal vaccine.** / Consult your health care provider.  Hepatitis A vaccine.** / Consult your health  care provider.  Hepatitis B vaccine.** / Consult your health care provider.  Haemophilus influenzae type b (Hib) vaccine.** / Consult your health care provider. Ages 5 years and over  Blood pressure check.** / Every 1 to 2 years.  Lipid and cholesterol check.** / Every 5 years beginning at age 14 years.  Lung cancer screening. / Every year if you are aged 11-80 years and have a 30-pack-year history of smoking and currently smoke or have quit within the past 15 years. Yearly screening is stopped  once you have quit smoking for at least 15 years or develop a health problem that would prevent you from having lung cancer treatment.  Clinical breast exam.** / Every year after age 70 years.  BRCA-related cancer risk assessment.** / For women who have family members with a BRCA-related cancer (breast, ovarian, tubal, or peritoneal cancers).  Mammogram.** / Every year beginning at age 73 years and continuing for as long as you are in good health. Consult with your health care provider.  Pap test.** / Every 3 years starting at age 52 years through age 64 or 65 years with 3 consecutive normal Pap tests. Testing can be stopped between 65 and 70 years with 3 consecutive normal Pap tests and no abnormal Pap or HPV tests in the past 10 years.  HPV screening.** / Every 3 years from ages 71 years through ages 69 or 38 years with a history of 3 consecutive normal Pap tests. Testing can be stopped between 65 and 70 years with 3 consecutive normal Pap tests and no abnormal Pap or HPV tests in the past 10 years.  Fecal occult blood test (FOBT) of stool. / Every year beginning at age 45 years and continuing until age 61 years. You may not need to do this test if you get a colonoscopy every 10 years.  Flexible sigmoidoscopy or colonoscopy.** / Every 5 years for a flexible sigmoidoscopy or every 10 years for a colonoscopy beginning at age 64 years and continuing until age 74 years.  Hepatitis C blood test.** / For all people born from [birth year] through 1966 and any individual with known risks for hepatitis C.  Osteoporosis screening.** / A one-time screening for women ages 33 years and over and women at risk for fractures or osteoporosis.  Skin self-exam. / Monthly.  Influenza vaccine. / Every year.  Tetanus, diphtheria, and acellular pertussis (Tdap/Td) vaccine.** / 1 dose of Td every 10 years.  Varicella vaccine.** / Consult your health care provider.  Zoster vaccine.** / 1 dose for adults aged 63 years  or older.  Pneumococcal 13-valent conjugate (PCV13) vaccine.** / Consult your health care provider.  Pneumococcal polysaccharide (PPSV23) vaccine.** / 1 dose for all adults aged 15 years and older.  Meningococcal vaccine.** / Consult your health care provider.  Hepatitis A vaccine.** / Consult your health care provider.  Hepatitis B vaccine.** / Consult your health care provider.  Haemophilus influenzae type b (Hib) vaccine.** / Consult your health care provider. ** Family history and personal history of risk and conditions may change your health care provider's recommendations. Document Released: 10/30/2001 Document Revised: 01/18/2014 Document Reviewed: 01/29/2011 [facility] Patient Information 2015 [place], [place]. This information is not intended to replace advice given to you by your health care provider. Make sure you discuss any questions you have with your health care provider.   Compression hose, Jobst light weight, knee hi and watch sodium, elevate feet

## 2014-12-24 LAB — RPR

## 2014-12-24 LAB — URINE CYTOLOGY ANCILLARY ONLY
Chlamydia: NEGATIVE
Neisseria Gonorrhea: NEGATIVE
Trichomonas: NEGATIVE

## 2014-12-24 LAB — HIV ANTIBODY (ROUTINE TESTING W REFLEX): HIV 1&2 Ab, 4th Generation: NONREACTIVE

## 2014-12-26 NOTE — Assessment & Plan Note (Signed)
Encouraged DASH diet, decrease po intake and increase exercise as tolerated. Needs 7-8 hours of sleep nightly. Avoid trans fats, eat small, frequent meals every 4-5 hours with lean proteins, complex carbs and healthy fats. Minimize simple carbs, GMO foods. 

## 2014-12-26 NOTE — Assessment & Plan Note (Signed)
May use antihistamines and flonase

## 2014-12-26 NOTE — Assessment & Plan Note (Signed)
Patient encouraged to maintain heart healthy diet, regular exercise, adequate sleep. Consider daily probiotics. Take medications as prescribed 

## 2014-12-26 NOTE — Assessment & Plan Note (Signed)
minimize simple carbs. Increase exercise as tolerated. Continue current meds  

## 2014-12-26 NOTE — Assessment & Plan Note (Signed)
Encouraged heart healthy diet, increase exercise, avoid trans fats, consider a krill oil cap daily 

## 2014-12-26 NOTE — Progress Notes (Signed)
Chelsea Bray  [id number] October 25, 1970 12/26/2014      Progress Note-Follow Up  Subjective  Chief Complaint  Chief Complaint  Patient presents with  . Annual Exam    HPI  Patient is a 44 y.o. female in today for routine medical care. Patient is in today for annual exam. Is under a great deal of stress secondary to a pending divorce. Is concerned about some low-grade vaginal discharge. No abdominal or back pain. No other acute complaints. No recent illness. Denies CP/palp/SOB/HA/congestion/fevers/GI or GU c/o. Taking meds as prescribed  Past Medical History  Diagnosis Date  . Hyperlipidemia   . Hypertension   . Morbid obesity 01/10/2013  . Acid reflux disease   . Preventative health care 11/29/2013  . Diabetes 11/17/2011  . Diabetes mellitus type 2 in obese 11/17/2011  . Vaginitis and vulvovaginitis 12/23/2014    Past Surgical History  Procedure Laterality Date  . Gallbladder surgery  1991    gall stone removed  . Cerebral microvascular decompression  2006    back of her head  . Breast reduction surgery  2001  . Cholecystectomy    . Mole removal      Family History  Problem Relation Age of Onset  . Breast cancer Neg Hx   . Heart disease Maternal Grandmother     MI  . Stroke Maternal Grandmother   . Diabetes Maternal Grandmother   . Arrhythmia Mother   . Diabetes Mother     maternal grandmother and aunts  . Hypertension Mother   . Heart attack Father   . Stroke Father   . Hyperlipidemia Father   . Hypertension Father   . Cancer Father     lung, smoker  . Heart disease Father     CABG x 4  . Prostate cancer Maternal Grandfather   . Cancer Maternal Grandfather     colon cancer, prostate  . Hypertension      parents and maternal parents  . Alcohol abuse Paternal Grandmother     died of pneumonia  . Heart disease Paternal Grandfather     MI  . Asthma Daughter     allergies    History   Social History  . Marital Status: Married    Spouse Name: N/A    . Number of Children: N/A  . Years of Education: N/A   Occupational History  . Not on file.   Social History Main Topics  . Smoking status: Never Smoker   . Smokeless tobacco: Never Used  . Alcohol Use: Yes     Comment: wine ocassion  . Drug Use: No  . Sexual Activity: Yes    Birth Control/ Protection: None     Comment: avoid dairy, lives with husband, daughters. works at school bus driver   Other Topics Concern  . Not on file   Social History Narrative    Current Outpatient Prescriptions on File Prior to Visit  Medication Sig Dispense Refill  . atorvastatin (LIPITOR) 20 MG tablet TAKE 1 TABLET BY MOUTH DAILY 30 tablet 0  . cetirizine (ZYRTEC) 10 MG tablet Take 1 tablet (10 mg total) by mouth daily. 30 tablet 11  . Cholecalciferol (VITAMIN D3) 50000 UNITS CAPS Take 50,000 mg by mouth once a week. 5 capsule 5  . fluticasone (FLONASE) 50 MCG/ACT nasal spray USE 2 SPRAYS IN EACH NOSTRIL EVERY DAY 16 g 1  . gabapentin (NEURONTIN) 100 MG capsule Take 1 capsule (100 mg total) by mouth 3 (three) times daily  as needed. 270 capsule 0  . glucose monitoring kit (FREESTYLE) monitoring kit 1 each by Does not apply route 3 (three) times a week. Or any monitor that insurance will cover  DX:790.29 1 each 0  . hydrochlorothiazide (HYDRODIURIL) 12.5 MG tablet TAKE 1 TABLET BY MOUTH EVERY DAY 90 tablet 3  . Lancets (FREESTYLE) lancets DX: 790.29 100 each 0  . loratadine (CLARITIN) 10 MG tablet Take 1 tablet (10 mg total) by mouth daily. 30 tablet 3  . modafinil (PROVIGIL) 100 MG tablet Take 1 tablet (100 mg total) by mouth daily as needed. 90 tablet 0  . mometasone (NASONEX) 50 MCG/ACT nasal spray Place 2 sprays into the nose daily. 17 g 12  . omeprazole (PRILOSEC) 40 MG capsule Take 1 capsule (40 mg total) by mouth 2 (two) times daily as needed.    . Vitamin D, Ergocalciferol, (DRISDOL) 50000 UNITS CAPS capsule TAKE 1 CAPSULE BY MOUTH WEEKLY 12 capsule 0  . zolpidem (AMBIEN) 10 MG tablet Take  1 tablet (10 mg total) by mouth at bedtime as needed for sleep. 30 tablet 0   No current facility-administered medications on file prior to visit.    No Known Allergies  Review of Systems  Review of Systems  Constitutional: Negative for fever and malaise/fatigue.  HENT: Negative for congestion.   Eyes: Negative for discharge.  Respiratory: Negative for shortness of breath.   Cardiovascular: Negative for chest pain, palpitations and leg swelling.  Gastrointestinal: Negative for nausea, abdominal pain and diarrhea.  Genitourinary: Negative for dysuria.  Musculoskeletal: Negative for falls.  Skin: Negative for rash.  Neurological: Negative for loss of consciousness and headaches.  Endo/Heme/Allergies: Negative for polydipsia.  Psychiatric/Behavioral: Positive for depression. Negative for suicidal ideas. The patient is nervous/anxious. The patient does not have insomnia.        Separated 6/15. Divorce is moving forward    Objective  BP 158/101 mmHg  Pulse 78  Temp(Src) 98.4 F (36.9 C) (Oral)  Resp 18  Ht 5' 5.5" (1.664 m)  Wt 238 lb (107.956 kg)  BMI 38.99 kg/m2  SpO2 98%  LMP 12/23/2014  Physical Exam  Physical Exam  Constitutional: She is oriented to person, place, and time and well-developed, well-nourished, and in no distress. No distress.  HENT:  Head: Normocephalic and atraumatic.  Right Ear: External ear normal.  Left Ear: External ear normal.  Nose: Nose normal.  Mouth/Throat: Oropharynx is clear and moist. No oropharyngeal exudate.  Eyes: Conjunctivae are normal. Pupils are equal, round, and reactive to light. Right eye exhibits no discharge. Left eye exhibits no discharge. No scleral icterus.  Neck: Normal range of motion. Neck supple. No thyromegaly present.  Cardiovascular: Normal rate, regular rhythm, normal heart sounds and intact distal pulses.   No murmur heard. Pulmonary/Chest: Effort normal and breath sounds normal. No respiratory distress. She has  no wheezes. She has no rales.  Abdominal: Soft. Bowel sounds are normal. She exhibits no distension and no mass. There is no tenderness.  Genitourinary: Uterus normal, cervix normal, right adnexa normal and left adnexa normal. Vaginal discharge found.  slight whitish discharge no pain  Musculoskeletal: Normal range of motion. She exhibits no edema or tenderness.  Lymphadenopathy:    She has no cervical adenopathy.  Neurological: She is alert and oriented to person, place, and time. She has normal reflexes. No cranial nerve deficit. Coordination normal.  Skin: Skin is warm and dry. No rash noted. She is not diaphoretic.  Psychiatric: Mood, memory and affect normal.  Lab Results  Component Value Date   TSH 1.42 12/23/2014   Lab Results  Component Value Date   WBC 7.2 12/23/2014   HGB 14.3 12/23/2014   HCT 42.5 12/23/2014   MCV 91.8 12/23/2014   PLT 346.0 12/23/2014   Lab Results  Component Value Date   CREATININE 0.93 12/23/2014   BUN 11 12/23/2014   NA 140 12/23/2014   K 4.0 12/23/2014   CL 104 12/23/2014   CO2 30 12/23/2014   Lab Results  Component Value Date   ALT 23 12/23/2014   AST 17 12/23/2014   ALKPHOS 57 12/23/2014   BILITOT 0.4 12/23/2014   Lab Results  Component Value Date   CHOL 216* 12/23/2014   Lab Results  Component Value Date   HDL 43.40 12/23/2014   Lab Results  Component Value Date   LDLCALC 150* 12/23/2014   Lab Results  Component Value Date   TRIG 112.0 12/23/2014   Lab Results  Component Value Date   CHOLHDL 5 12/23/2014     Assessment & Plan  Essential hypertension Poorly controlled will alter medications, encouraged DASH diet, minimize caffeine and obtain adequate sleep. Report concerning symptoms and follow up as directed and as needed   GERD (gastroesophageal reflux disease) Avoid offending foods, start probiotics. Do not eat large meals in late evening and consider raising head of bed.    Diabetes mellitus type 2 in  obese hgba1c acceptable, minimize simple carbs. Increase exercise as tolerated. Continue current meds. Will repeat A1C today   Allergic rhinitis May use antihistamines and flonase   Elevated blood sugar  minimize simple carbs. Increase exercise as tolerated. Continue current meds   OSA (obstructive sleep apnea) Past reports last A1C was in 2008 will request old records and arrange a repeat sleep studyd.    Hyperlipidemia, mixed Encouraged heart healthy diet, increase exercise, avoid trans fats, consider a krill oil cap daily

## 2014-12-26 NOTE — Assessment & Plan Note (Signed)
Past reports last A1C was in 2008 will request old records and arrange a repeat sleep studyd.

## 2014-12-26 NOTE — Assessment & Plan Note (Signed)
Patient notes some discharge no pain or odor. Encouraged a probiotic and avoid harsh soaps.

## 2014-12-27 LAB — URINE CYTOLOGY ANCILLARY ONLY
BACTERIAL VAGINITIS: NEGATIVE
Candida vaginitis: NEGATIVE

## 2014-12-30 ENCOUNTER — Telehealth: Payer: Self-pay | Admitting: *Deleted

## 2014-12-30 ENCOUNTER — Other Ambulatory Visit: Payer: Self-pay | Admitting: Family Medicine

## 2014-12-30 MED ORDER — VITAMIN D (ERGOCALCIFEROL) 1.25 MG (50000 UNIT) PO CAPS: ORAL_CAPSULE | ORAL | Status: DC

## 2014-12-30 MED ORDER — METOPROLOL SUCCINATE ER 25 MG PO TB24: 25.0000 mg | ORAL_TABLET | Freq: Every day | ORAL | Status: DC

## 2014-12-30 NOTE — Telephone Encounter (Signed)
[facility] please let her know

## 2014-12-30 NOTE — Telephone Encounter (Signed)
-----   Message from Bradd CanaryStacey A Blyth, MD sent at 12/23/2014 10:46 PM EDT ----- Notify Vitamin d is low start 50000IU caps 1 po weekly, disp #4 with 3 rf

## 2014-12-30 NOTE — Telephone Encounter (Signed)
Called and spoke with the pt and informed her of recent lab results and note.  Pt verbalized understanding and agreed.  Pt stated that she was already taking the Vitamin D 50000IU,but have been out of it.  Pt agreed to restated the Vitamin D, and med was refilled.  Pt also stated that when she was here at her last visit her BP was elevated, and she thought she was told she would be started on an additional BP medication.  Pt said that the new medication was not sent to her pharmacy.  Please advise.//AB/CMA

## 2015-01-03 NOTE — Telephone Encounter (Signed)
Called and spoke with the pt and informed her of the note below.  Pt understood and agreed.//AB/CMA

## 2015-01-18 ENCOUNTER — Ambulatory Visit (INDEPENDENT_AMBULATORY_CARE_PROVIDER_SITE_OTHER): Payer: BC Managed Care – PPO | Admitting: Physician Assistant

## 2015-01-18 ENCOUNTER — Encounter: Payer: Self-pay | Admitting: Physician Assistant

## 2015-01-18 VITALS — BP 125/70 | HR 90 | Temp 98.1°F | Wt 242.0 lb

## 2015-01-18 DIAGNOSIS — J019 Acute sinusitis, unspecified: Secondary | ICD-10-CM

## 2015-01-18 DIAGNOSIS — B9689 Other specified bacterial agents as the cause of diseases classified elsewhere: Secondary | ICD-10-CM

## 2015-01-18 MED ORDER — AMOXICILLIN-POT CLAVULANATE 875-125 MG PO TABS: 1.0000 | ORAL_TABLET | Freq: Two times a day (BID) | ORAL | Status: DC

## 2015-01-18 MED ORDER — BENZONATATE 200 MG PO CAPS: 200.0000 mg | ORAL_CAPSULE | Freq: Three times a day (TID) | ORAL | Status: DC | PRN

## 2015-01-18 NOTE — Patient Instructions (Signed)
Please take antibiotic as directed.  Increase fluid intake.  Use Saline nasal spray.  Take a daily multivitamin. Use Tessalon four cough and Mucinex for congestion and mucous.  Place a humidifier in the bedroom.  Please call or return clinic if symptoms are not improving.  Sinusitis Sinusitis is redness, soreness, and swelling (inflammation) of the paranasal sinuses. Paranasal sinuses are air pockets within the bones of your face (beneath the eyes, the middle of the forehead, or above the eyes). In healthy paranasal sinuses, mucus is able to drain out, and air is able to circulate through them by way of your nose. However, when your paranasal sinuses are inflamed, mucus and air can become trapped. This can allow bacteria and other germs to grow and cause infection. Sinusitis can develop quickly and last only a short time (acute) or continue over a long period (chronic). Sinusitis that lasts for more than 12 weeks is considered chronic.  CAUSES  Causes of sinusitis include:  Allergies.  Structural abnormalities, such as displacement of the cartilage that separates your nostrils (deviated septum), which can decrease the air flow through your nose and sinuses and affect sinus drainage.  Functional abnormalities, such as when the small hairs (cilia) that line your sinuses and help remove mucus do not work properly or are not present. SYMPTOMS  Symptoms of acute and chronic sinusitis are the same. The primary symptoms are pain and pressure around the affected sinuses. Other symptoms include:  Upper toothache.  Earache.  Headache.  Bad breath.  Decreased sense of smell and taste.  A cough, which worsens when you are lying flat.  Fatigue.  Fever.  Thick drainage from your nose, which often is green and may contain pus (purulent).  Swelling and warmth over the affected sinuses. DIAGNOSIS  Your caregiver will perform a physical exam. During the exam, your caregiver may:  Look in your  nose for signs of abnormal growths in your nostrils (nasal polyps).  Tap over the affected sinus to check for signs of infection.  View the inside of your sinuses (endoscopy) with a special imaging device with a light attached (endoscope), which is inserted into your sinuses. If your caregiver suspects that you have chronic sinusitis, one or more of the following tests may be recommended:  Allergy tests.  Nasal culture A sample of mucus is taken from your nose and sent to a lab and screened for bacteria.  Nasal cytology A sample of mucus is taken from your nose and examined by your caregiver to determine if your sinusitis is related to an allergy. TREATMENT  Most cases of acute sinusitis are related to a viral infection and will resolve on their own within 10 days. Sometimes medicines are prescribed to help relieve symptoms (pain medicine, decongestants, nasal steroid sprays, or saline sprays).  However, for sinusitis related to a bacterial infection, your caregiver will prescribe antibiotic medicines. These are medicines that will help kill the bacteria causing the infection.  Rarely, sinusitis is caused by a fungal infection. In theses cases, your caregiver will prescribe antifungal medicine. For some cases of chronic sinusitis, surgery is needed. Generally, these are cases in which sinusitis recurs more than 3 times per year, despite other treatments. HOME CARE INSTRUCTIONS   Drink plenty of water. Water helps thin the mucus so your sinuses can drain more easily.  Use a humidifier.  Inhale steam 3 to 4 times a day (for example, sit in the bathroom with the shower running).  Apply a warm, moist  washcloth to your face 3 to 4 times a day, or as directed by your caregiver.  Use saline nasal sprays to help moisten and clean your sinuses.  Take over-the-counter or prescription medicines for pain, discomfort, or fever only as directed by your caregiver. SEEK IMMEDIATE MEDICAL CARE  IF:  You have increasing pain or severe headaches.  You have nausea, vomiting, or drowsiness.  You have swelling around your face.  You have vision problems.  You have a stiff neck.  You have difficulty breathing. MAKE SURE YOU:   Understand these instructions.  Will watch your condition.  Will get help right away if you are not doing well or get worse. Document Released: 09/03/2005 Document Revised: 11/26/2011 Document Reviewed: 09/18/2011 [facility] Patient Information 2014 [place], [place].

## 2015-01-18 NOTE — Progress Notes (Signed)
Patient presents to clinic today c/o allergy symptoms including rhinirrhea and watery eyes that has now turned into sinus congestion, sinus pain, ear pain, PND and cough productive of yellow sputum x 1 week.  Denies fever, chest pain or SOB.  Is having some body aches presently. Denies recent travel or sick contact.   Past Medical History  Diagnosis Date  . Hyperlipidemia   . Hypertension   . Morbid obesity 01/10/2013  . Acid reflux disease   . Preventative health care 11/29/2013  . Diabetes 11/17/2011  . Diabetes mellitus type 2 in obese 11/17/2011  . Vaginitis and vulvovaginitis 12/23/2014    Current Outpatient Prescriptions on File Prior to Visit  Medication Sig Dispense Refill  . atorvastatin (LIPITOR) 20 MG tablet TAKE 1 TABLET BY MOUTH DAILY 30 tablet 0  . cetirizine (ZYRTEC) 10 MG tablet Take 1 tablet (10 mg total) by mouth daily. 30 tablet 11  . Cholecalciferol (VITAMIN D3) 50000 UNITS CAPS Take 50,000 mg by mouth once a week. 5 capsule 5  . fluticasone (FLONASE) 50 MCG/ACT nasal spray USE 2 SPRAYS IN EACH NOSTRIL EVERY DAY 16 g 1  . gabapentin (NEURONTIN) 100 MG capsule Take 1 capsule (100 mg total) by mouth 3 (three) times daily as needed. 270 capsule 0  . glucose monitoring kit (FREESTYLE) monitoring kit 1 each by Does not apply route 3 (three) times a week. Or any monitor that insurance will cover  DX:790.29 1 each 0  . hydrochlorothiazide (HYDRODIURIL) 12.5 MG tablet TAKE 1 TABLET BY MOUTH EVERY DAY 90 tablet 3  . Lancets (FREESTYLE) lancets DX: 790.29 100 each 0  . loratadine (CLARITIN) 10 MG tablet Take 1 tablet (10 mg total) by mouth daily. 30 tablet 3  . metoprolol succinate (TOPROL-XL) 25 MG 24 hr tablet Take 1 tablet (25 mg total) by mouth daily. 30 tablet 1  . modafinil (PROVIGIL) 100 MG tablet Take 1 tablet (100 mg total) by mouth daily as needed. 90 tablet 0  . mometasone (NASONEX) 50 MCG/ACT nasal spray Place 2 sprays into the nose daily. 17 g 12  . omeprazole  (PRILOSEC) 40 MG capsule Take 1 capsule (40 mg total) by mouth 2 (two) times daily as needed.    . Vitamin D, Ergocalciferol, (DRISDOL) 50000 UNITS CAPS capsule TAKE 1 CAPSULE BY MOUTH WEEKLY 4 capsule 3  . zolpidem (AMBIEN) 10 MG tablet Take 1 tablet (10 mg total) by mouth at bedtime as needed for sleep. 30 tablet 0   No current facility-administered medications on file prior to visit.    No Known Allergies  Family History  Problem Relation Age of Onset  . Breast cancer Neg Hx   . Heart disease Maternal Grandmother     MI  . Stroke Maternal Grandmother   . Diabetes Maternal Grandmother   . Arrhythmia Mother   . Diabetes Mother     maternal grandmother and aunts  . Hypertension Mother   . Heart attack Father   . Stroke Father   . Hyperlipidemia Father   . Hypertension Father   . Cancer Father     lung, smoker  . Heart disease Father     CABG x 4  . Prostate cancer Maternal Grandfather   . Cancer Maternal Grandfather     colon cancer, prostate  . Hypertension      parents and maternal parents  . Alcohol abuse Paternal Grandmother     died of pneumonia  . Heart disease Paternal Grandfather  MI  . Asthma Daughter     allergies    History   Social History  . Marital Status: Married    Spouse Name: N/A  . Number of Children: N/A  . Years of Education: N/A   Social History Main Topics  . Smoking status: Never Smoker   . Smokeless tobacco: Never Used  . Alcohol Use: Yes     Comment: wine ocassion  . Drug Use: No  . Sexual Activity: Yes    Birth Control/ Protection: None     Comment: avoid dairy, lives with husband, daughters. works at school bus driver   Other Topics Concern  . Not on file   Social History Narrative   Review of Systems - See HPI.  All other ROS are negative.  BP 125/70 mmHg  Pulse 90  Temp(Src) 98.1 F (36.7 C)  Wt 242 lb (109.77 kg)  SpO2 97%  LMP 12/23/2014  Physical Exam  Constitutional: She is oriented to person, place, and  time and well-developed, well-nourished, and in no distress.  HENT:  Head: Normocephalic and atraumatic.  Right Ear: Tympanic membrane, external ear and ear canal normal.  Left Ear: Tympanic membrane, external ear and ear canal normal.  Nose: Right sinus exhibits maxillary sinus tenderness and frontal sinus tenderness.  Mouth/Throat: Uvula is midline, oropharynx is clear and moist and mucous membranes are normal.  Eyes: Conjunctivae are normal.  Neck: Neck supple.  Cardiovascular: Normal rate, regular rhythm, normal heart sounds and intact distal pulses.   Pulmonary/Chest: Effort normal and breath sounds normal. No respiratory distress. She has no wheezes. She has no rales. She exhibits no tenderness.  Lymphadenopathy:    She has no cervical adenopathy.  Neurological: She is alert and oriented to person, place, and time.  Skin: Skin is warm and dry. No rash noted.  Psychiatric: Affect normal.  Vitals reviewed.   Recent Results (from the past 2160 hour(s))  Comprehensive metabolic panel     Status: Abnormal   Collection Time: 11/18/14 10:27 AM  Result Value Ref Range   Sodium 137 135 - 145 mEq/L   Potassium 4.0 3.5 - 5.1 mEq/L   Chloride 103 96 - 112 mEq/L   CO2 27 19 - 32 mEq/L   Glucose, Bld 123 (H) 70 - 99 mg/dL   BUN 11 6 - 23 mg/dL   Creatinine, Ser 0.97 0.40 - 1.20 mg/dL   Total Bilirubin 0.5 0.2 - 1.2 mg/dL   Alkaline Phosphatase 54 39 - 117 U/L   AST 18 0 - 37 U/L   ALT 21 0 - 35 U/L   Total Protein 7.7 6.0 - 8.3 g/dL   Albumin 4.2 3.5 - 5.2 g/dL   Calcium 9.4 8.4 - 10.5 mg/dL   GFR 80.07 >60.00 mL/min  Urine cytology ancillary only     Status: None   Collection Time: 12/23/14 12:00 AM  Result Value Ref Range   Chlamydia Negative     Comment: Normal Reference Range - Negative   Neisseria gonorrhea Negative     Comment: Normal Reference Range - Negative   Trichomonas Negative     Comment: Normal Reference Range - Negative  Urine cytology ancillary only     Status:  None   Collection Time: 12/23/14 12:00 AM  Result Value Ref Range   Bacterial vaginitis Negative for Bacterial Vaginitis Microorganisms     Comment: Normal Reference Range - Negative   Candida vaginitis Negative for Candida Vaginitis Microorganisms     Comment: Normal  Reference Range - Negative  CBC     Status: None   Collection Time: 12/23/14 10:43 AM  Result Value Ref Range   WBC 7.2 4.0 - 10.5 K/uL   RBC 4.63 3.87 - 5.11 Mil/uL   Platelets 346.0 150.0 - 400.0 K/uL   Hemoglobin 14.3 12.0 - 15.0 g/dL   HCT 42.5 36.0 - 46.0 %   MCV 91.8 78.0 - 100.0 fl   MCHC 33.7 30.0 - 36.0 g/dL   RDW 14.2 11.5 - 15.5 %  TSH     Status: None   Collection Time: 12/23/14 10:43 AM  Result Value Ref Range   TSH 1.42 0.35 - 4.50 uIU/mL  Hemoglobin A1c     Status: Abnormal   Collection Time: 12/23/14 10:43 AM  Result Value Ref Range   Hgb A1c MFr Bld 7.2 (H) 4.6 - 6.5 %    Comment: Glycemic Control Guidelines for People with Diabetes:Non Diabetic:  <6%Goal of Therapy: <7%Additional Action Suggested:  >8%   Vit D  25 hydroxy (rtn osteoporosis monitoring)     Status: Abnormal   Collection Time: 12/23/14 10:43 AM  Result Value Ref Range   VITD 17.66 (L) 30.00 - 100.00 ng/mL  Comprehensive metabolic panel     Status: Abnormal   Collection Time: 12/23/14 10:43 AM  Result Value Ref Range   Sodium 140 135 - 145 mEq/L   Potassium 4.0 3.5 - 5.1 mEq/L   Chloride 104 96 - 112 mEq/L   CO2 30 19 - 32 mEq/L   Glucose, Bld 126 (H) 70 - 99 mg/dL   BUN 11 6 - 23 mg/dL   Creatinine, Ser 0.93 0.40 - 1.20 mg/dL   Total Bilirubin 0.4 0.2 - 1.2 mg/dL   Alkaline Phosphatase 57 39 - 117 U/L   AST 17 0 - 37 U/L   ALT 23 0 - 35 U/L   Total Protein 7.7 6.0 - 8.3 g/dL   Albumin 4.2 3.5 - 5.2 g/dL   Calcium 9.8 8.4 - 10.5 mg/dL   GFR 84.02 >60.00 mL/min  Lipid panel     Status: Abnormal   Collection Time: 12/23/14 10:43 AM  Result Value Ref Range   Cholesterol 216 (H) 0 - 200 mg/dL    Comment: ATP III  Classification       Desirable:  < 200 mg/dL               Borderline High:  200 - 239 mg/dL          High:  > = 240 mg/dL   Triglycerides 112.0 0.0 - 149.0 mg/dL    Comment: Normal:  <150 mg/dLBorderline High:  150 - 199 mg/dL   HDL 43.40 >39.00 mg/dL   VLDL 22.4 0.0 - 40.0 mg/dL   LDL Cholesterol 150 (H) 0 - 99 mg/dL   Total CHOL/HDL Ratio 5     Comment:                Men          Women1/2 Average Risk     3.4          3.3Average Risk          5.0          4.42X Average Risk          9.6          7.13X Average Risk          15.0  11.0                       NonHDL 172.60     Comment: NOTE:  Non-HDL goal should be 30 mg/dL higher than patient's LDL goal (i.e. LDL goal of < 70 mg/dL, would have non-HDL goal of < 100 mg/dL)  HIV antibody (with reflex)     Status: None   Collection Time: 12/23/14 10:43 AM  Result Value Ref Range   HIV 1&2 Ab, 4th Generation NONREACTIVE NONREACTIVE    Comment:   A NONREACTIVE HIV Ag/Ab result does not exclude HIV infection since the time frame for seroconversion is variable. If acute HIV infection is suspected, a HIV-1 RNA Qualitative TMA test is recommended.   HIV-1/2 Antibody Diff         Not indicated. HIV-1 RNA, Qual TMA           Not indicated.   PLEASE NOTE: This information has been disclosed to you from records whose confidentiality may be protected by state law. If your state requires such protection, then the state law prohibits you from making any further disclosure of the information without the specific written consent of the person to whom it pertains, or as otherwise permitted by law. A general authorization for the release of medical or other information is NOT sufficient for this purpose.   The performance of this assay has not been clinically validated in patients less than 75 years old.   RPR     Status: None   Collection Time: 12/23/14 10:43 AM  Result Value Ref Range   RPR Ser Ql NON REAC NON REAC     Assessment/Plan: Acute bacterial sinusitis Rx Augmentin.  Increase fluids.  Rest.  Saline nasal spray.  Probiotic.  Mucinex as directed.  Humidifier in bedroom. Tessalon for cough.  Call or return to clinic if symptoms are not improving.

## 2015-01-18 NOTE — Progress Notes (Signed)
Pre visit review using our clinic review tool, if applicable. No additional management support is needed unless otherwise documented below in the visit note. 

## 2015-01-18 NOTE — Assessment & Plan Note (Signed)
Rx Augmentin.  Increase fluids.  Rest.  Saline nasal spray.  Probiotic.  Mucinex as directed.  Humidifier in bedroom. Tessalon for cough.  Call or return to clinic if symptoms are not improving.  

## 2015-03-08 ENCOUNTER — Institutional Professional Consult (permissible substitution): Payer: BC Managed Care – PPO | Admitting: Pulmonary Disease

## 2015-03-22 ENCOUNTER — Encounter (HOSPITAL_BASED_OUTPATIENT_CLINIC_OR_DEPARTMENT_OTHER): Payer: Self-pay | Admitting: Emergency Medicine

## 2015-03-22 ENCOUNTER — Other Ambulatory Visit: Payer: Self-pay

## 2015-03-22 ENCOUNTER — Emergency Department (HOSPITAL_BASED_OUTPATIENT_CLINIC_OR_DEPARTMENT_OTHER)
Admission: EM | Admit: 2015-03-22 | Discharge: 2015-03-22 | Disposition: A | Discharge status: Home / Self Care | Payer: BC Managed Care – PPO | Attending: Emergency Medicine | Admitting: Emergency Medicine

## 2015-03-22 ENCOUNTER — Telehealth: Payer: Self-pay | Admitting: Family Medicine

## 2015-03-22 DIAGNOSIS — E785 Hyperlipidemia, unspecified: Secondary | ICD-10-CM | POA: Diagnosis not present

## 2015-03-22 DIAGNOSIS — Z8742 Personal history of other diseases of the female genital tract: Secondary | ICD-10-CM | POA: Diagnosis not present

## 2015-03-22 DIAGNOSIS — E119 Type 2 diabetes mellitus without complications: Secondary | ICD-10-CM | POA: Insufficient documentation

## 2015-03-22 DIAGNOSIS — I10 Essential (primary) hypertension: Secondary | ICD-10-CM | POA: Diagnosis not present

## 2015-03-22 DIAGNOSIS — Z7951 Long term (current) use of inhaled steroids: Secondary | ICD-10-CM | POA: Insufficient documentation

## 2015-03-22 DIAGNOSIS — I160 Hypertensive urgency: Secondary | ICD-10-CM

## 2015-03-22 DIAGNOSIS — K219 Gastro-esophageal reflux disease without esophagitis: Secondary | ICD-10-CM | POA: Diagnosis not present

## 2015-03-22 DIAGNOSIS — R51 Headache: Secondary | ICD-10-CM | POA: Diagnosis present

## 2015-03-22 DIAGNOSIS — Z79899 Other long term (current) drug therapy: Secondary | ICD-10-CM | POA: Insufficient documentation

## 2015-03-22 DIAGNOSIS — Z794 Long term (current) use of insulin: Secondary | ICD-10-CM | POA: Diagnosis not present

## 2015-03-22 LAB — URINALYSIS, ROUTINE W REFLEX MICROSCOPIC
Bilirubin Urine: NEGATIVE
GLUCOSE, UA: NEGATIVE mg/dL
Ketones, ur: NEGATIVE mg/dL
Leukocytes, UA: NEGATIVE
Nitrite: NEGATIVE
Protein, ur: NEGATIVE mg/dL
Specific Gravity, Urine: 1.022 (ref 1.005–1.030)
UROBILINOGEN UA: 0.2 mg/dL (ref 0.0–1.0)
pH: 6 (ref 5.0–8.0)

## 2015-03-22 LAB — COMPREHENSIVE METABOLIC PANEL
ALT: 26 U/L (ref 14–54)
AST: 24 U/L (ref 15–41)
Albumin: 3.8 g/dL (ref 3.5–5.0)
Alkaline Phosphatase: 50 U/L (ref 38–126)
Anion gap: 10 (ref 5–15)
BUN: 15 mg/dL (ref 6–20)
CHLORIDE: 101 mmol/L (ref 101–111)
CO2: 27 mmol/L (ref 22–32)
Calcium: 9.3 mg/dL (ref 8.9–10.3)
Creatinine, Ser: 1.04 mg/dL — ABNORMAL HIGH (ref 0.44–1.00)
GFR calc Af Amer: >60 mL/min (ref >60)
GLUCOSE: 129 mg/dL — AB (ref 65–99)
POTASSIUM: 3.4 mmol/L — AB (ref 3.5–5.1)
Sodium: 138 mmol/L (ref 135–145)
Total Bilirubin: 0.3 mg/dL (ref 0.3–1.2)
Total Protein: 7.3 g/dL (ref 6.5–8.1)

## 2015-03-22 LAB — URINE MICROSCOPIC-ADD ON

## 2015-03-22 LAB — CBC WITH DIFFERENTIAL/PLATELET
Basophils Absolute: 0 10*3/uL (ref 0.0–0.1)
Basophils Relative: 0 % (ref 0–1)
EOS PCT: 1 % (ref 0–5)
Eosinophils Absolute: 0.1 10*3/uL (ref 0.0–0.7)
HEMATOCRIT: 40.1 % (ref 36.0–46.0)
HEMOGLOBIN: 13.6 g/dL (ref 12.0–15.0)
LYMPHS PCT: 29 % (ref 12–46)
Lymphs Abs: 2.1 10*3/uL (ref 0.7–4.0)
MCH: 31.6 pg (ref 26.0–34.0)
MCHC: 33.9 g/dL (ref 30.0–36.0)
MCV: 93 fL (ref 78.0–100.0)
MONOS PCT: 7 % (ref 3–12)
Monocytes Absolute: 0.5 10*3/uL (ref 0.1–1.0)
Neutro Abs: 4.6 10*3/uL (ref 1.7–7.7)
Neutrophils Relative %: 63 % (ref 43–77)
PLATELETS: 351 10*3/uL (ref 150–400)
RBC: 4.31 MIL/uL (ref 3.87–5.11)
RDW: 13.2 % (ref 11.5–15.5)
WBC: 7.2 10*3/uL (ref 4.0–10.5)

## 2015-03-22 LAB — TROPONIN I: Troponin I: <0.03 ng/mL (ref <0.031)

## 2015-03-22 LAB — LIPASE, BLOOD: Lipase: 19 U/L — ABNORMAL LOW (ref 22–51)

## 2015-03-22 MED ORDER — DIPHENHYDRAMINE HCL 50 MG/ML IJ SOLN
25.0000 mg | Freq: Once | INTRAMUSCULAR | Status: AC
  Administered 2015-03-22: 25 mg via INTRAVENOUS
  Filled 2015-03-22: qty 1

## 2015-03-22 MED ORDER — KETOROLAC TROMETHAMINE 30 MG/ML IJ SOLN
30.0000 mg | Freq: Once | INTRAMUSCULAR | Status: AC
  Administered 2015-03-22: 30 mg via INTRAVENOUS
  Filled 2015-03-22: qty 1

## 2015-03-22 MED ORDER — METOCLOPRAMIDE HCL 5 MG/ML IJ SOLN
10.0000 mg | Freq: Once | INTRAMUSCULAR | Status: AC
  Administered 2015-03-22: 10 mg via INTRAVENOUS
  Filled 2015-03-22: qty 2

## 2015-03-22 NOTE — ED Provider Notes (Signed)
CSN: [id number]     Arrival date & time 03/22/15  2131 History   This chart was scribed for Chelsea Freiberg, MD by Hilda Lias, ED Scribe. This patient was seen in room MH12/MH12 and the patient's care was started at 10:11 PM.  Chief Complaint  Patient presents with  . Headache      Patient is a 44 y.o. female presenting with headaches. The history is provided by the patient. No language interpreter was used.  Headache Pain location:  Generalized Quality:  Dull Radiates to:  Does not radiate Severity currently:  5/10 Onset quality:  Gradual Duration:  1 day Chronicity:  New Similar to prior headaches: yes   Relieved by:  None tried Associated symptoms: dizziness   Associated symptoms: no diarrhea, no fever, no nausea and no vomiting    HPI Comments: Chelsea Bray is a 44 y.o. female with a hx of DM and HTN who presents to the Emergency Department complaining of a 5/10 constant headache with associated dizziness that has been present since earlier this evening. Pt states she feels like she is going to pass out a lot. Pt also reports her blood pressure and blood sugar have been high recently.  Pt denies SOB, fever, nausea, vomiting, diarrhea.    Past Medical History  Diagnosis Date  . Hyperlipidemia   . Hypertension   . Morbid obesity 01/10/2013  . Acid reflux disease   . Preventative health care 11/29/2013  . Diabetes 11/17/2011  . Diabetes mellitus type 2 in obese 11/17/2011  . Vaginitis and vulvovaginitis 12/23/2014   Past Surgical History  Procedure Laterality Date  . Gallbladder surgery  1991    gall stone removed  . Cerebral microvascular decompression  2006    back of her head  . Breast reduction surgery  2001  . Cholecystectomy    . Mole removal     Family History  Problem Relation Age of Onset  . Breast cancer Neg Hx   . Heart disease Maternal Grandmother     MI  . Stroke Maternal Grandmother   . Diabetes Maternal Grandmother   . Arrhythmia Mother   .  Diabetes Mother     maternal grandmother and aunts  . Hypertension Mother   . Heart attack Father   . Stroke Father   . Hyperlipidemia Father   . Hypertension Father   . Cancer Father     lung, smoker  . Heart disease Father     CABG x 4  . Prostate cancer Maternal Grandfather   . Cancer Maternal Grandfather     colon cancer, prostate  . Hypertension      parents and maternal parents  . Alcohol abuse Paternal Grandmother     died of pneumonia  . Heart disease Paternal Grandfather     MI  . Asthma Daughter     allergies   History  Substance Use Topics  . Smoking status: Never Smoker   . Smokeless tobacco: Never Used  . Alcohol Use: Yes     Comment: wine ocassion   OB History    No data available     Review of Systems  Constitutional: Negative for fever.  Respiratory: Negative for shortness of breath.   Gastrointestinal: Negative for nausea, vomiting and diarrhea.  Neurological: Positive for dizziness, light-headedness and headaches.  All other systems reviewed and are negative.     Allergies  Review of patient's allergies indicates no known allergies.  Home Medications   Prior to Admission  medications   Medication Sig Start Date End Date Taking? Authorizing Provider  cetirizine (ZYRTEC) 10 MG tablet Take 1 tablet (10 mg total) by mouth daily. 11/23/13  Yes Mosie Lukes, MD  glucose monitoring kit (FREESTYLE) monitoring kit 1 each by Does not apply route 3 (three) times a week. Or any monitor that insurance will cover  DX:790.29 11/30/13  Yes Mosie Lukes, MD  hydrochlorothiazide (HYDRODIURIL) 12.5 MG tablet TAKE 1 TABLET BY MOUTH EVERY DAY 11/18/14  Yes Mackie Pai, PA-C  Lancets (FREESTYLE) lancets DX: 790.29 11/30/13  Yes Mosie Lukes, MD  metoprolol succinate (TOPROL-XL) 25 MG 24 hr tablet Take 1 tablet (25 mg total) by mouth daily. 12/30/14  Yes Mosie Lukes, MD  Vitamin D, Ergocalciferol, (DRISDOL) 50000 UNITS CAPS capsule TAKE 1 CAPSULE BY MOUTH  WEEKLY 12/30/14  Yes Mosie Lukes, MD  amoxicillin-clavulanate (AUGMENTIN) 875-125 MG per tablet Take 1 tablet by mouth 2 (two) times daily. 01/18/15   Brunetta Jeans, PA-C  atorvastatin (LIPITOR) 20 MG tablet TAKE 1 TABLET BY MOUTH DAILY 07/22/13   Mosie Lukes, MD  benzonatate (TESSALON) 200 MG capsule Take 1 capsule (200 mg total) by mouth 3 (three) times daily as needed for cough. 01/18/15   Brunetta Jeans, PA-C  Cholecalciferol (VITAMIN D3) 50000 UNITS CAPS Take 50,000 mg by mouth once a week. 06/13/12   Burnice Logan, MD  fluticasone [facility]) 50 MCG/ACT nasal spray USE 2 SPRAYS IN [facility] NOSTRIL EVERY DAY 11/18/14   Mackie Pai, PA-C  gabapentin (NEURONTIN) 100 MG capsule Take 1 capsule (100 mg total) by mouth 3 (three) times daily as needed. 11/23/13   Mosie Lukes, MD  loratadine (CLARITIN) 10 MG tablet Take 1 tablet (10 mg total) by mouth daily. 11/18/14   Percell Miller Saguier, PA-C  modafinil (PROVIGIL) 100 MG tablet Take 1 tablet (100 mg total) by mouth daily as needed. 04/28/14   Mosie Lukes, MD  mometasone (NASONEX) 50 MCG/ACT nasal spray Place 2 sprays into the nose daily. 11/23/13   Mosie Lukes, MD  omeprazole (PRILOSEC) 40 MG capsule Take 1 capsule (40 mg total) by mouth 2 (two) times daily as needed. 11/23/13   Mosie Lukes, MD  zolpidem (AMBIEN) 10 MG tablet Take 1 tablet (10 mg total) by mouth at bedtime as needed for sleep. 07/21/13   Debbrah Alar, NP   BP 146/91 mmHg  Pulse 81  Temp(Src) 98.5 F (36.9 C) (Oral)  Resp 20  Ht $R'5\' 5"'Tn$  (1.651 m)  Wt 241 lb (109.317 kg)  BMI 40.10 kg/m2  SpO2 96%  LMP 03/22/2015 Physical Exam  Constitutional: She is oriented to person, place, and time. She appears well-developed and well-nourished.  HENT:  Head: Normocephalic and atraumatic.  Right Ear: External ear normal.  Left Ear: External ear normal.  Eyes: Conjunctivae and EOM are normal. Pupils are equal, round, and reactive to light.  Neck: Normal range of motion. Neck supple.   Cardiovascular: Normal rate, regular rhythm, normal heart sounds and intact distal pulses.   Pulmonary/Chest: Effort normal and breath sounds normal.  Abdominal: Soft. Bowel sounds are normal. There is no tenderness.  Musculoskeletal: Normal range of motion.  Neurological: She is alert and oriented to person, place, and time. She has normal strength and normal reflexes. No cranial nerve deficit or sensory deficit. Coordination and gait normal.  Skin: Skin is warm and dry.  Vitals reviewed.   ED Course  Procedures (including critical care time)  DIAGNOSTIC  STUDIES: Oxygen Saturation is 100% on room air, normal by my interpretation.    COORDINATION OF CARE: 10:16 PM Discussed treatment plan with pt at bedside and pt agreed to plan.   Labs Review Labs Reviewed  URINALYSIS, ROUTINE W REFLEX MICROSCOPIC (NOT AT [facility]: [facility]) - Abnormal; Notable for the following:    Hgb urine dipstick MODERATE (*)    All other components within normal limits  COMPREHENSIVE METABOLIC PANEL - Abnormal; Notable for the following:    Potassium 3.4 (*)    Glucose, Bld 129 (*)    Creatinine, Ser 1.04 (*)    All other components within normal limits  LIPASE, BLOOD - Abnormal; Notable for the following:    Lipase 19 (*)    All other components within normal limits  CBC WITH DIFFERENTIAL/PLATELET  TROPONIN I  URINE MICROSCOPIC-ADD ON    Imaging Review No results found.   EKG Interpretation   Date/Time:  Tuesday March 22 2015 22:31:49 EDT Ventricular Rate:  89 PR Interval:  130 QRS Duration: 78 QT Interval:  368 QTC Calculation: 447 R Axis:   34 Text Interpretation:  Normal sinus rhythm with sinus arrhythmia  Nonspecific T wave abnormality Abnormal ECG No old tracing to compare  Confirmed by Chelsea Bray [phone]) on 03/22/2015 11:30:12 PM      MDM   Final diagnoses:  Hypertensive urgency    44 y.o. female with pertinent PMH of preDM, HTN presents with vague symptoms as above, currently only  with mild ha.  Physical exam benign.  Given HA cocktail with relief of symptoms.  Wu unremarkable with exception of mild cr elevation, hyperglycemia.  Unlikely contributing.  DC home in stable condition.  I have reviewed all laboratory and imaging studies if ordered as above  1. Hypertensive urgency           Chelsea Freiberg, MD 03/22/15 [id number]

## 2015-03-22 NOTE — ED Notes (Signed)
44 yo c/o not feeling normal with a headache. Denies LOC and is ambulatory at triage.

## 2015-03-22 NOTE — Telephone Encounter (Signed)
Patient Name: Chelsea Bray  DOB: 1971-06-09    Initial Comment Caller states she is feeling shaky and jittery and her BP was 153/95 yesterday.   Nurse Assessment  Nurse: Annye Englisharmon, RN, Denise Date/Time (Eastern Time): 03/22/2015 4:39:19 PM  Confirm and document reason for call. If symptomatic, describe symptoms. ---Caller states she is feeling shaky and jittery and her BP was 153/95 and her BG 173 yesterday. States she has been feeling faint, shaky, and very tired over the last 2 days. States she is not a diabetic. States she seems to feel better after she eats. States she is a "borderline" diabetic per MD and reports her mother is a diabetic.  Has the patient traveled out of the country within the last 30 days? ---Not Applicable  Does the patient require triage? ---Yes  Related visit to physician within the last 2 weeks? ---No  Does the PT have any chronic conditions? (i.e. diabetes, asthma, etc.) ---No  Did the patient indicate they were pregnant? ---No     Guidelines    Guideline Title Affirmed Question Affirmed Notes  Weakness (Generalized) and Fatigue [1] MODERATE weakness (i.e., interferes with work, school, normal activities) AND [2] persists > 3 days Pt is unsure what her BP and BG are today. States the meters to ck both measurments are at her mother's home. States today, she just feels very tired and weak as if she has no energy. Advised not to skip meals and to eat on a regular basis.   Final Disposition User   See Physician within 24 Hours Carmon, RN, Angelique BlonderDenise    Comments  Pt drives out of state for work [phone] in the am. States she called earlier this afternoon and made an appt to be seen [facility] 03/25/15 [facility]. Advised she needs to be seen within 24 hours and preferrably prior to getting on the road in the am. [facility] Advised to f/u with the PA on 03/25/15 as scheduled previously. Verb  understanding.

## 2015-03-22 NOTE — Discharge Instructions (Signed)
Near-Syncope Near-syncope (commonly known as near fainting) is sudden weakness, dizziness, or feeling like you might pass out. During an episode of near-syncope, you may also develop pale skin, have tunnel vision, or feel sick to your stomach (nauseous). Near-syncope may occur when getting up after sitting or while standing for a long time. It is caused by a sudden decrease in blood flow to the brain. This decrease can result from various causes or triggers, most of which are not serious. However, because near-syncope can sometimes be a sign of something serious, a medical evaluation is required. The specific cause is often not determined. HOME CARE INSTRUCTIONS  Monitor your condition for any changes. The following actions may help to alleviate any discomfort you are experiencing:  Have someone stay with you until you feel stable.  Lie down right away and prop your feet up if you start feeling like you might faint. Breathe deeply and steadily. Wait until all the symptoms have passed. Most of these episodes last only a few minutes. You may feel tired for several hours.   Drink enough fluids to keep your urine clear or pale yellow.   If you are taking blood pressure or heart medicine, get up slowly when seated or lying down. Take several minutes to sit and then stand. This can reduce dizziness.  Follow up with your health care provider as directed. SEEK IMMEDIATE MEDICAL CARE IF:   You have a severe headache.   You have unusual pain in the chest, abdomen, or back.   You are bleeding from the mouth or rectum, or you have black or tarry stool.   You have an irregular or very fast heartbeat.   You have repeated fainting or have seizure-like jerking during an episode.   You faint when sitting or lying down.   You have confusion.   You have difficulty walking.   You have severe weakness.   You have vision problems.  MAKE SURE YOU:   Understand these instructions.  Will  watch your condition.  Will get help right away if you are not doing well or get worse. Document Released: 09/03/2005 Document Revised: 09/08/2013 Document Reviewed: 02/06/2013 [facility] Patient Information 2015 [place], [place]. This information is not intended to replace advice given to you by your health care provider. Make sure you discuss any questions you have with your health care provider. General Headache Without Cause A headache is pain or discomfort felt around the head or neck area. The specific cause of a headache may not be found. There are many causes and types of headaches. A few common ones are:  Tension headaches.  Migraine headaches.  Cluster headaches.  Chronic daily headaches. HOME CARE INSTRUCTIONS   Keep all follow-up appointments with your caregiver or any specialist referral.  Only take over-the-counter or prescription medicines for pain or discomfort as directed by your caregiver.  Lie down in a dark, quiet room when you have a headache.  Keep a headache journal to find out what may trigger your migraine headaches. For example, write down:  What you eat and drink.  How much sleep you get.  Any change to your diet or medicines.  Try massage or other relaxation techniques.  Put ice packs or heat on the head and neck. Use these 3 to 4 times per day for 15 to 20 minutes each time, or as needed.  Limit stress.  Sit up straight, and do not tense your muscles.  Quit smoking if you smoke.  Limit alcohol  use.  Decrease the amount of caffeine you drink, or stop drinking caffeine.  Eat and sleep on a regular schedule.  Get 7 to 9 hours of sleep, or as recommended by your caregiver.  Keep lights dim if bright lights bother you and make your headaches worse. SEEK MEDICAL CARE IF:   You have problems with the medicines you were prescribed.  Your medicines are not working.  You have a change from the usual headache.  You have nausea or  vomiting. SEEK IMMEDIATE MEDICAL CARE IF:   Your headache becomes severe.  You have a fever.  You have a stiff neck.  You have loss of vision.  You have muscular weakness or loss of muscle control.  You start losing your balance or have trouble walking.  You feel faint or pass out.  You have severe symptoms that are different from your first symptoms. MAKE SURE YOU:   Understand these instructions.  Will watch your condition.  Will get help right away if you are not doing well or get worse. Document Released: 09/03/2005 Document Revised: 11/26/2011 Document Reviewed: 09/19/2011 [facility] Patient Information 2015 [place], [place]. This information is not intended to replace advice given to you by your health care provider. Make sure you discuss any questions you have with your health care provider. Hypertension Hypertension, commonly called high blood pressure, is when the force of blood pumping through your arteries is too strong. Your arteries are the blood vessels that carry blood from your heart throughout your body. A blood pressure reading consists of a higher number over a lower number, such as 110/72. The higher number (systolic) is the pressure inside your arteries when your heart pumps. The lower number (diastolic) is the pressure inside your arteries when your heart relaxes. Ideally you want your blood pressure below 120/80. Hypertension forces your heart to work harder to pump blood. Your arteries may become narrow or stiff. Having hypertension puts you at risk for heart disease, stroke, and other problems.  RISK FACTORS Some risk factors for high blood pressure are controllable. Others are not.  Risk factors you cannot control include:   Race. You may be at higher risk if you are African American.  Age. Risk increases with age.  Gender. Men are at higher risk than women before age 13 years. After age 65, women are at higher risk than men. Risk factors you can control  include:  Not getting enough exercise or physical activity.  Being overweight.  Getting too much fat, sugar, calories, or salt in your diet.  Drinking too much alcohol. SIGNS AND SYMPTOMS Hypertension does not usually cause signs or symptoms. Extremely high blood pressure (hypertensive crisis) may cause headache, anxiety, shortness of breath, and nosebleed. DIAGNOSIS  To check if you have hypertension, your health care provider will measure your blood pressure while you are seated, with your arm held at the level of your heart. It should be measured at least twice using the same arm. Certain conditions can cause a difference in blood pressure between your right and left arms. A blood pressure reading that is higher than normal on one occasion does not mean that you need treatment. If one blood pressure reading is high, ask your health care provider about having it checked again. TREATMENT  Treating high blood pressure includes making lifestyle changes and possibly taking medicine. Living a healthy lifestyle can help lower high blood pressure. You may need to change some of your habits. Lifestyle changes may include:  Following  the DASH diet. This diet is high in fruits, vegetables, and whole grains. It is low in salt, red meat, and added sugars.  Getting at least 2 hours of brisk physical activity every week.  Losing weight if necessary.  Not smoking.  Limiting alcoholic beverages.  Learning ways to reduce stress. If lifestyle changes are not enough to get your blood pressure under control, your health care provider may prescribe medicine. You may need to take more than one. Work closely with your health care provider to understand the risks and benefits. HOME CARE INSTRUCTIONS  Have your blood pressure rechecked as directed by your health care provider.   Take medicines only as directed by your health care provider. Follow the directions carefully. Blood pressure medicines must  be taken as prescribed. The medicine does not work as well when you skip doses. Skipping doses also puts you at risk for problems.   Do not smoke.   Monitor your blood pressure at home as directed by your health care provider. SEEK MEDICAL CARE IF:   You think you are having a reaction to medicines taken.  You have recurrent headaches or feel dizzy.  You have swelling in your ankles.  You have trouble with your vision. SEEK IMMEDIATE MEDICAL CARE IF:  You develop a severe headache or confusion.  You have unusual weakness, numbness, or feel faint.  You have severe chest or abdominal pain.  You vomit repeatedly.  You have trouble breathing. MAKE SURE YOU:   Understand these instructions.  Will watch your condition.  Will get help right away if you are not doing well or get worse. Document Released: 09/03/2005 Document Revised: 01/18/2014 Document Reviewed: 06/26/2013 [facility] Patient Information 2015 [place]. This information is not intended to replace advice given to you by your health care provider. Make sure you discuss any questions you have with your health care provider.

## 2015-03-22 NOTE — Telephone Encounter (Signed)
Pt called to schedule appt. Stated she has been feeling jittery and shaky and her BP has been high. Stated BP was 153/95 yesterday. Scheduled for 03/25/15 8:00 as requested by pt and [facility] [place] [facility] [facility].

## 2015-03-23 NOTE — Telephone Encounter (Signed)
[facility]

## 2015-03-24 ENCOUNTER — Encounter: Payer: BC Managed Care – PPO | Admitting: Family Medicine

## 2015-03-25 ENCOUNTER — Ambulatory Visit: Payer: BC Managed Care – PPO | Admitting: Medical

## 2015-03-29 ENCOUNTER — Ambulatory Visit: Payer: BC Managed Care – PPO | Admitting: Family Medicine

## 2015-05-12 ENCOUNTER — Institutional Professional Consult (permissible substitution): Payer: BC Managed Care – PPO | Admitting: Pulmonary Disease

## 2015-06-06 ENCOUNTER — Other Ambulatory Visit: Payer: Self-pay | Admitting: Family Medicine

## 2015-06-16 ENCOUNTER — Other Ambulatory Visit (HOSPITAL_BASED_OUTPATIENT_CLINIC_OR_DEPARTMENT_OTHER): Payer: Self-pay | Admitting: Internal Medicine

## 2015-06-16 DIAGNOSIS — Z1231 Encounter for screening mammogram for malignant neoplasm of breast: Secondary | ICD-10-CM

## 2015-06-28 ENCOUNTER — Ambulatory Visit (HOSPITAL_BASED_OUTPATIENT_CLINIC_OR_DEPARTMENT_OTHER): Payer: BC Managed Care – PPO

## 2015-11-08 ENCOUNTER — Ambulatory Visit (INDEPENDENT_AMBULATORY_CARE_PROVIDER_SITE_OTHER): Payer: BC Managed Care – PPO | Admitting: Family Medicine

## 2015-11-08 ENCOUNTER — Encounter: Payer: Self-pay | Admitting: Family Medicine

## 2015-11-08 ENCOUNTER — Other Ambulatory Visit: Payer: Self-pay | Admitting: Family Medicine

## 2015-11-08 VITALS — BP 120/78 | HR 87 | Temp 98.5°F | Ht 65.0 in | Wt 245.4 lb

## 2015-11-08 DIAGNOSIS — E559 Vitamin D deficiency, unspecified: Secondary | ICD-10-CM

## 2015-11-08 DIAGNOSIS — M2550 Pain in unspecified joint: Secondary | ICD-10-CM | POA: Insufficient documentation

## 2015-11-08 DIAGNOSIS — M25529 Pain in unspecified elbow: Secondary | ICD-10-CM

## 2015-11-08 DIAGNOSIS — R079 Chest pain, unspecified: Secondary | ICD-10-CM

## 2015-11-08 DIAGNOSIS — M79629 Pain in unspecified upper arm: Secondary | ICD-10-CM | POA: Diagnosis not present

## 2015-11-08 DIAGNOSIS — E119 Type 2 diabetes mellitus without complications: Secondary | ICD-10-CM | POA: Diagnosis not present

## 2015-11-08 DIAGNOSIS — R002 Palpitations: Secondary | ICD-10-CM

## 2015-11-08 DIAGNOSIS — I10 Essential (primary) hypertension: Secondary | ICD-10-CM | POA: Diagnosis not present

## 2015-11-08 DIAGNOSIS — E1169 Type 2 diabetes mellitus with other specified complication: Secondary | ICD-10-CM

## 2015-11-08 DIAGNOSIS — E782 Mixed hyperlipidemia: Secondary | ICD-10-CM | POA: Diagnosis not present

## 2015-11-08 DIAGNOSIS — G540 Brachial plexus disorders: Secondary | ICD-10-CM

## 2015-11-08 DIAGNOSIS — E669 Obesity, unspecified: Secondary | ICD-10-CM

## 2015-11-08 HISTORY — DX: Pain in unspecified elbow: M25.529

## 2015-11-08 LAB — LIPID PANEL
Cholesterol: 200 mg/dL (ref 0–200)
HDL: 40.3 mg/dL (ref >39.00)
LDL Cholesterol: 130 mg/dL — ABNORMAL HIGH (ref 0–99)
NonHDL: 159.47
Total CHOL/HDL Ratio: 5
Triglycerides: 147 mg/dL (ref 0.0–149.0)
VLDL: 29.4 mg/dL (ref 0.0–40.0)

## 2015-11-08 LAB — CBC
HEMATOCRIT: 39.8 % (ref 36.0–46.0)
Hemoglobin: 13.6 g/dL (ref 12.0–15.0)
MCHC: 34.2 g/dL (ref 30.0–36.0)
MCV: 91.1 fl (ref 78.0–100.0)
PLATELETS: 376 10*3/uL (ref 150.0–400.0)
RBC: 4.36 Mil/uL (ref 3.87–5.11)
RDW: 14.1 % (ref 11.5–15.5)
WBC: 6.7 10*3/uL (ref 4.0–10.5)

## 2015-11-08 LAB — COMPREHENSIVE METABOLIC PANEL
ALK PHOS: 43 U/L (ref 39–117)
ALT: 23 U/L (ref 0–35)
AST: 17 U/L (ref 0–37)
Albumin: 4.1 g/dL (ref 3.5–5.2)
BUN: 10 mg/dL (ref 6–23)
CO2: 28 mEq/L (ref 19–32)
Calcium: 9 mg/dL (ref 8.4–10.5)
Chloride: 103 mEq/L (ref 96–112)
Creatinine, Ser: 0.84 mg/dL (ref 0.40–1.20)
GFR: 94.12 mL/min (ref >60.00)
GLUCOSE: 163 mg/dL — AB (ref 70–99)
POTASSIUM: 3.9 meq/L (ref 3.5–5.1)
SODIUM: 138 meq/L (ref 135–145)
Total Bilirubin: 0.3 mg/dL (ref 0.2–1.2)
Total Protein: 7.1 g/dL (ref 6.0–8.3)

## 2015-11-08 LAB — TSH: TSH: 1.3 u[IU]/mL (ref 0.35–4.50)

## 2015-11-08 LAB — MAGNESIUM: Magnesium: 2 mg/dL (ref 1.5–2.5)

## 2015-11-08 LAB — HEMOGLOBIN A1C: Hgb A1c MFr Bld: 8 % — ABNORMAL HIGH (ref 4.6–6.5)

## 2015-11-08 MED ORDER — METHOCARBAMOL 500 MG PO TABS: 500.0000 mg | ORAL_TABLET | Freq: Every evening | ORAL | Status: DC | PRN

## 2015-11-08 NOTE — Progress Notes (Signed)
Subjective:    Patient ID: Chelsea Bray, female    DOB: 1970/09/30, 45 y.o.   MRN: [id number]  Chief Complaint  Patient presents with  . Arm Pain  . Palpitations    HPI Patient is in today for Acute concerns, arm pain that is aching right more than left. It comes and goes and is most notable after she's been lying in bed. Will have radicular symptoms and arms at times. No injury or fall. It is been going on off and on for quite some time roughly 6 weeks. She also has intermittent episodes of palpitations over the last few months. They do not have associated symptoms and occur somewhat randomly and resolve spontaneously. She is requesting a refill on her zolpidem for sleep. No other recent illness does acknowledge some fatigue. Denies CP/palp/SOB/HA/congestion/fevers/GI or GU c/o. Taking meds as prescribed    Past Medical History  Diagnosis Date  . Hyperlipidemia   . Hypertension   . Morbid obesity ([place]) 01/10/2013  . Acid reflux disease   . Preventative health care 11/29/2013  . Diabetes ([place]) 11/17/2011  . Diabetes mellitus type 2 in obese ([place]) 11/17/2011  . Vaginitis and vulvovaginitis 12/23/2014  . Pain in joint, upper arm 11/08/2015  . TOS (thoracic outlet syndrome) 11/20/2015    Past Surgical History  Procedure Laterality Date  . Gallbladder surgery  1991    gall stone removed  . Cerebral microvascular decompression  2006    back of her head  . Breast reduction surgery  2001  . Cholecystectomy    . Mole removal      Family History  Problem Relation Age of Onset  . Breast cancer Neg Hx   . Heart disease Maternal Grandmother     MI  . Stroke Maternal Grandmother   . Diabetes Maternal Grandmother   . Arrhythmia Mother   . Diabetes Mother     maternal grandmother and aunts  . Hypertension Mother   . Heart attack Father   . Stroke Father   . Hyperlipidemia Father   . Hypertension Father   . Cancer Father     lung, smoker  . Heart disease Father     CABG x 4    . Prostate cancer Maternal Grandfather   . Cancer Maternal Grandfather     colon cancer, prostate  . Hypertension      parents and maternal parents  . Alcohol abuse Paternal Grandmother     died of pneumonia  . Heart disease Paternal Grandfather     MI  . Asthma Daughter     allergies    Social History   Social History  . Marital Status: Married    Spouse Name: N/A  . Number of Children: N/A  . Years of Education: N/A   Occupational History  . Not on file.   Social History Main Topics  . Smoking status: Never Smoker   . Smokeless tobacco: Never Used  . Alcohol Use: Yes     Comment: wine ocassion  . Drug Use: No  . Sexual Activity: Yes    Birth Control/ Protection: None     Comment: avoid dairy, lives with husband, daughters. works at school bus driver   Other Topics Concern  . Not on file   Social History Narrative    Outpatient Prescriptions Prior to Visit  Medication Sig Dispense Refill  . cetirizine (ZYRTEC) 10 MG tablet Take 1 tablet (10 mg total) by mouth daily. 30 tablet 11  .  Cholecalciferol (VITAMIN D3) 50000 UNITS CAPS Take 50,000 mg by mouth once a week. 5 capsule 5  . fluticasone (FLONASE) 50 MCG/ACT nasal spray USE 2 SPRAYS IN EACH NOSTRIL EVERY DAY 16 g 1  . gabapentin (NEURONTIN) 100 MG capsule Take 1 capsule (100 mg total) by mouth 3 (three) times daily as needed. 270 capsule 0  . glucose monitoring kit (FREESTYLE) monitoring kit 1 each by Does not apply route 3 (three) times a week. Or any monitor that insurance will cover  DX:790.29 1 each 0  . Lancets (FREESTYLE) lancets DX: 790.29 100 each 0  . loratadine (CLARITIN) 10 MG tablet Take 1 tablet (10 mg total) by mouth daily. 30 tablet 3  . modafinil (PROVIGIL) 100 MG tablet Take 1 tablet (100 mg total) by mouth daily as needed. 90 tablet 0  . mometasone (NASONEX) 50 MCG/ACT nasal spray Place 2 sprays into the nose daily. 17 g 12  . omeprazole (PRILOSEC) 40 MG capsule Take 1 capsule (40 mg total)  by mouth 2 (two) times daily as needed.    . Vitamin D, Ergocalciferol, (DRISDOL) 50000 UNITS CAPS capsule TAKE 1 CAPSULE BY MOUTH WEEKLY 4 capsule 3  . zolpidem (AMBIEN) 10 MG tablet Take 1 tablet (10 mg total) by mouth at bedtime as needed for sleep. 30 tablet 0  . amoxicillin-clavulanate (AUGMENTIN) 875-125 MG per tablet Take 1 tablet by mouth 2 (two) times daily. 20 tablet 0  . atorvastatin (LIPITOR) 20 MG tablet TAKE 1 TABLET BY MOUTH DAILY 30 tablet 0  . benzonatate (TESSALON) 200 MG capsule Take 1 capsule (200 mg total) by mouth 3 (three) times daily as needed for cough. 30 capsule 0  . hydrochlorothiazide (HYDRODIURIL) 12.5 MG tablet TAKE 1 TABLET BY MOUTH EVERY DAY 90 tablet 3  . metoprolol succinate (TOPROL-XL) 25 MG 24 hr tablet TAKE ONE TABLET BY MOUTH ONCE DAILY 30 tablet 0   No facility-administered medications prior to visit.    No Known Allergies  ROS     Objective:    Physical Exam  BP 120/78 mmHg  Pulse 87  Temp(Src) 98.5 F (36.9 C) (Oral)  Ht '5\' 5"'$  (1.651 m)  Wt 245 lb 6 oz (111.301 kg)  BMI 40.83 kg/m2  SpO2 95% Wt Readings from Last 3 Encounters:  11/08/15 245 lb 6 oz (111.301 kg)  03/22/15 241 lb (109.317 kg)  01/18/15 242 lb (109.77 kg)     Lab Results  Component Value Date   WBC 6.7 11/08/2015   HGB 13.6 11/08/2015   HCT 39.8 11/08/2015   PLT 376.0 11/08/2015   GLUCOSE 163* 11/08/2015   CHOL 200 11/08/2015   TRIG 147.0 11/08/2015   HDL 40.30 11/08/2015   LDLCALC 130* 11/08/2015   ALT 23 11/08/2015   AST 17 11/08/2015   NA 138 11/08/2015   K 3.9 11/08/2015   CL 103 11/08/2015   CREATININE 0.84 11/08/2015   BUN 10 11/08/2015   CO2 28 11/08/2015   TSH 1.30 11/08/2015   HGBA1C 8.0* 11/08/2015    Lab Results  Component Value Date   TSH 1.30 11/08/2015   Lab Results  Component Value Date   WBC 6.7 11/08/2015   HGB 13.6 11/08/2015   HCT 39.8 11/08/2015   MCV 91.1 11/08/2015   PLT 376.0 11/08/2015   Lab Results  Component Value  Date   NA 138 11/08/2015   K 3.9 11/08/2015   CO2 28 11/08/2015   GLUCOSE 163* 11/08/2015   BUN 10 11/08/2015  CREATININE 0.84 11/08/2015   BILITOT 0.3 11/08/2015   ALKPHOS 43 11/08/2015   AST 17 11/08/2015   ALT 23 11/08/2015   PROT 7.1 11/08/2015   ALBUMIN 4.1 11/08/2015   CALCIUM 9.0 11/08/2015   ANIONGAP 10 03/22/2015   GFR 94.12 11/08/2015   Lab Results  Component Value Date   CHOL 200 11/08/2015   Lab Results  Component Value Date   HDL 40.30 11/08/2015   Lab Results  Component Value Date   LDLCALC 130* 11/08/2015   Lab Results  Component Value Date   TRIG 147.0 11/08/2015   Lab Results  Component Value Date   CHOLHDL 5 11/08/2015   Lab Results  Component Value Date   HGBA1C 8.0* 11/08/2015       Assessment & Plan:   Problem List Items Addressed This Visit    RESOLVED: Chest pain   Relevant Orders   Lipid panel (Completed)   Comprehensive metabolic panel (Completed)   TSH (Completed)   CBC (Completed)   Hemoglobin A1c (Completed)   Magnesium (Completed)   EKG 12-Lead (Completed)   Diabetes mellitus type 2 in obese (HCC)    hgba1c unacceptable, minimize simple carbs. Increase exercise as tolerated. Metformin bid      Relevant Orders   Lipid panel (Completed)   Comprehensive metabolic panel (Completed)   TSH (Completed)   CBC (Completed)   Hemoglobin A1c (Completed)   Magnesium (Completed)   EKG 12-Lead (Completed)   Essential hypertension    Well controlled, no changes to meds. Encouraged heart healthy diet such as the DASH diet and exercise as tolerated.       Relevant Medications   metoprolol succinate (TOPROL-XL) 50 MG 24 hr tablet   hydrochlorothiazide (HYDRODIURIL) 25 MG tablet   Other Relevant Orders   Lipid panel (Completed)   Comprehensive metabolic panel (Completed)   TSH (Completed)   CBC (Completed)   Hemoglobin A1c (Completed)   Magnesium (Completed)   EKG 12-Lead (Completed)   Hyperlipidemia, mixed   Relevant  Medications   metoprolol succinate (TOPROL-XL) 50 MG 24 hr tablet   hydrochlorothiazide (HYDRODIURIL) 25 MG tablet   Other Relevant Orders   Lipid panel (Completed)   Comprehensive metabolic panel (Completed)   TSH (Completed)   CBC (Completed)   Hemoglobin A1c (Completed)   Magnesium (Completed)   EKG 12-Lead (Completed)   Morbid obesity (HCC)    Encouraged DASH diet, decrease po intake and increase exercise as tolerated. Needs 7-8 hours of sleep nightly. Avoid trans fats, eat small, frequent meals every 4-5 hours with lean proteins, complex carbs and healthy fats. Minimize simple carbs, GMO foods.      Pain in joint, upper arm - Primary    No pattern comes and goes, dull ache. Does not awaken her. Stops on it's own after a couple of hours. No associated symptoms.       Relevant Orders   Lipid panel (Completed)   Comprehensive metabolic panel (Completed)   TSH (Completed)   CBC (Completed)   Hemoglobin A1c (Completed)   Magnesium (Completed)   EKG 12-Lead (Completed)   Palpitations    EKG normal today, minimize caffeine and report if worsens/returns for referral. Seek care if occurs and does not resolve      Relevant Orders   Lipid panel (Completed)   Comprehensive metabolic panel (Completed)   TSH (Completed)   CBC (Completed)   Hemoglobin A1c (Completed)   Magnesium (Completed)   EKG 12-Lead (Completed)   TOS (thoracic outlet syndrome)  R>L, pain improved with position changes. Encouraged moist heat and gentle stretching as tolerated. May try NSAIDs and prescription meds as directed and report if symptoms worsen or seek immediate care. Report worsening symptoms      Relevant Medications   methocarbamol (ROBAXIN) 500 MG tablet   Vitamin D deficiency   Relevant Orders   Lipid panel (Completed)   Comprehensive metabolic panel (Completed)   TSH (Completed)   CBC (Completed)   Hemoglobin A1c (Completed)   Magnesium (Completed)   EKG 12-Lead (Completed)      I  have discontinued Ms. Solivan atorvastatin, amoxicillin-clavulanate, and benzonatate. I am also having her start on methocarbamol. Additionally, I am having her maintain her Vitamin D3, zolpidem, omeprazole, gabapentin, cetirizine, mometasone, freestyle, glucose monitoring kit, modafinil, loratadine, fluticasone, Vitamin D (Ergocalciferol), metoprolol succinate, and hydrochlorothiazide.  Meds ordered this encounter  Medications  . metoprolol succinate (TOPROL-XL) 50 MG 24 hr tablet    Sig: Take 50 mg by mouth daily. Take with or immediately following a meal.  . hydrochlorothiazide (HYDRODIURIL) 25 MG tablet    Sig: Take 25 mg by mouth daily.  . methocarbamol (ROBAXIN) 500 MG tablet    Sig: Take 1 tablet (500 mg total) by mouth at bedtime as needed for muscle spasms.    Dispense:  30 tablet    Refill:  0     Penni Homans, MD

## 2015-11-08 NOTE — Assessment & Plan Note (Addendum)
hgba1c unacceptable, minimize simple carbs. Increase exercise as tolerated. Metformin bid

## 2015-11-08 NOTE — Patient Instructions (Addendum)
Lidocaine patches, asprecreme, salonpas patches  Thoracic Outlet Syndrome  Thoracic outlet syndrome (TOS) is a group of signs and symptoms that result when the vein, artery, or nerves that supply your arm and hand are squeezed (compressed). To reach your arm, all of these have to pass through a tight space under your collarbone and above your top rib (thoracic outlet). There are three types of TOS:  Compression of the nerves that supply your arm and hand is called neurogenic TOS. Most people with TOS have this type.  Compression of the vein that returns blood from your arm and hand (subclavian vein) is called venous TOS.  Compression of the artery that carries blood to your arm and hand (subclavian artery) is called arterial TOS. Arterial TOS is the rarest type. Depending on which structures are affected, you may have symptoms on one side or both sides of your body. CAUSES  Neurogenic TOS may be caused by swelling or scarring in your neck muscles that results in the narrowing of your thoracic outlet. This leads to nerve compression. It can happen from:  Neck injuries from an auto accident (whiplash).  Falls.  Repetitive stress on your neck from working with your arms. This stress could be from using a keyboard all day or working on an assembly line.  Venous TOS may be caused by doing hard work with your arms, especially if you have to lift your arms above your head. A blood clot may form in the vein.  Arterial TOS may be caused by having an extra rib at the base of your neck (cervical rib). This rib presses on your subclavian artery. Over time, this pressure may cause a clot to form inside the artery, or the artery may weaken and balloon outward (aneurysm). RISK FACTORS  You may be at greater risk for neurogenic TOS after a neck injury or repetitive stress on your neck.  You may be at greater risk for venous TOS if you do strenuous and repetitive work with your arms.  You may be at  greater risk for arterial TOS if you were born with a cervical rib. Risk factors for any type of TOS include:  Being female.  Being overweight.  Having poor posture. SIGNS AND SYMPTOMS  Your signs and symptoms will depend on the type of TOS that you have. Signs and symptoms of neurogenic TOS may include:  Pain in your shoulder, arm, or hand.  Tingling or numbness in your shoulder, arm, or hand.  Tiredness or weakness of your shoulder, arm, or hand.  Neck pain.  Headache. Signs and symptoms of venous TOS may include:  Pain and swelling of your whole arm.  Arm skin that is darker than usual. Signs and symptoms of arterial TOS may include:  Pain and cramps in your arm or hand.  Pale arm skin.  Very cold hands. All signs and symptoms of TOS may be worse when you hold your arms over your head. DIAGNOSIS Your health care provider may suspect TOS from your symptoms. A physical exam will be done. During the exam, your health care provider may ask you to hold your arms over your head to check whether your symptoms get worse. Tests may also be done to confirm the diagnosis and to find out what is causing TOS. These may include:  Imaging studies, such as:  X-rays to look for a cervical rib.  A test using sound waves to create an image (ultrasound).  CT scan.  MRI.  A test  that involves measuring and recording the pulses in your wrists (pulse volume recording).  A test that involves measuring the conduction speed of nerve impulses in your arm (nerve conduction velocity test).  A test in which X-rays are done after dye is injected into your subclavian artery or vein (venography or arteriography). TREATMENT  Treatment depends on the type of TOS that you have.  Neurogenic TOS may be treated with:  Physical therapy to learn stretching exercises and good posture.  Occupational therapy to improve your workplace and home environment.  Medicine, including pain medicine,  muscle relaxants, and anti-inflammatory medicine.  Surgery to remove scarred neck muscles or the first rib. This is rarely done for this type of TOS.  Venous TOS may be treated with:  Medicine, including blood thinners or blood clot dissolvers.  Surgery to remove a blood clot.  Surgery to remove the uppermost rib to make more space in the thoracic outlet.  Arterial TOS may be treated with surgery to:  Remove the cervical rib.  Remove a blood clot (thrombus).  Repair an aneurysm. HOME CARE INSTRUCTIONS  Take medicines only as directed by your health care provider.  Maintain a healthy weight. Lose weight as directed by your health care provider.  Do stretching exercises at home as directed by your health care provider or physical therapist.  Maintain good posture.  Do not carry heavy bags over your shoulder.  Do not repetitively lift heavy objects over your head.  Take frequent breaks to stretch and rest your arms if you work at a keyboard or do other repetitive work with your hands and arms.  Keep all follow-up visits as directed by your health care provider. This is important. SEEK MEDICAL CARE IF:  You have pain, cramps, numbness, or tingling in your arm or hand.  Your arm or hand frequently feels tired.  Your arm develops a darker skin color than usual.  Your hand feels cold.  You have frequent headaches or neckaches. SEEK IMMEDIATE MEDICAL CARE IF:   You lose feeling in your arm or hand.  You are unable to move your fingers.  Your fingers turn a dark color.   This information is not intended to replace advice given to you by your health care provider. Make sure you discuss any questions you have with your health care provider.   Document Released: 08/24/2002 Document Revised: 09/24/2014 Document Reviewed: 02/03/2014 Elsevier Interactive Patient Education [employer]

## 2015-11-08 NOTE — Assessment & Plan Note (Signed)
No pattern comes and goes, dull ache. Does not awaken her. Stops on it's own after a couple of hours. No associated symptoms.

## 2015-11-08 NOTE — Assessment & Plan Note (Signed)
Well controlled, no changes to meds. Encouraged heart healthy diet such as the DASH diet and exercise as tolerated.  °

## 2015-11-08 NOTE — Progress Notes (Signed)
Pre visit review using our clinic review tool, if applicable. No additional management support is needed unless otherwise documented below in the visit note. 

## 2015-11-09 ENCOUNTER — Other Ambulatory Visit: Payer: Self-pay | Admitting: Family Medicine

## 2015-11-09 MED ORDER — METFORMIN HCL 500 MG PO TABS: 500.0000 mg | ORAL_TABLET | Freq: Two times a day (BID) | ORAL | Status: DC

## 2015-11-20 ENCOUNTER — Encounter: Payer: Self-pay | Admitting: Family Medicine

## 2015-11-20 DIAGNOSIS — G540 Brachial plexus disorders: Secondary | ICD-10-CM

## 2015-11-20 DIAGNOSIS — R002 Palpitations: Secondary | ICD-10-CM | POA: Insufficient documentation

## 2015-11-20 HISTORY — DX: Brachial plexus disorders: G54.0

## 2015-11-20 NOTE — Assessment & Plan Note (Signed)
R>L, pain improved with position changes. Encouraged moist heat and gentle stretching as tolerated. May try NSAIDs and prescription meds as directed and report if symptoms worsen or seek immediate care. Report worsening symptoms

## 2015-11-20 NOTE — Assessment & Plan Note (Addendum)
EKG normal today, minimize caffeine and report if worsens/returns for referral. Seek care if occurs and does not resolve

## 2015-11-20 NOTE — Assessment & Plan Note (Signed)
Encouraged DASH diet, decrease po intake and increase exercise as tolerated. Needs 7-8 hours of sleep nightly. Avoid trans fats, eat small, frequent meals every 4-5 hours with lean proteins, complex carbs and healthy fats. Minimize simple carbs, GMO foods. 

## 2015-12-21 ENCOUNTER — Other Ambulatory Visit: Payer: Self-pay | Admitting: Family Medicine

## 2015-12-21 NOTE — Telephone Encounter (Signed)
Patient stated Medication Refill, that would be forwarded to provider's Assistant, please check with them [needs provider authorization to fill Rx]/SLS 04/05

## 2015-12-21 NOTE — Telephone Encounter (Signed)
Pt called in to check the status of a medication refill. She says that her pharmacy said that she have to have her Rx for vitamin D authorized. Pt says that pharmacy stated that they faxed over form at the beginning of March, informed pt not showing. However we will be more than happy to take care of. Informed pt of authorization time frame.  Confirmed pt's insurance information is the same.    CB: [phone]

## 2015-12-22 MED ORDER — VITAMIN D (ERGOCALCIFEROL) 1.25 MG (50000 UNIT) PO CAPS: ORAL_CAPSULE | ORAL | Status: DC

## 2015-12-22 NOTE — Telephone Encounter (Signed)
Have her stay on the 50000 IU for next 3-4 months and on 2000 IU daily then recheck vitamin D level now and in 3 months if she is willing

## 2015-12-22 NOTE — Telephone Encounter (Signed)
Sent in prescription. Called patient left msg. To call back.

## 2015-12-22 NOTE — Telephone Encounter (Signed)
Advise if she is to stay on prescription strength or not last vit. D lab was April of 2016

## 2015-12-23 NOTE — Telephone Encounter (Signed)
Called & left a message to call back.

## 2015-12-23 NOTE — Telephone Encounter (Signed)
Called left message to call back 

## 2016-01-12 ENCOUNTER — Encounter: Payer: Self-pay | Admitting: Family Medicine

## 2016-01-12 ENCOUNTER — Ambulatory Visit (INDEPENDENT_AMBULATORY_CARE_PROVIDER_SITE_OTHER): Payer: BC Managed Care – PPO | Admitting: Family Medicine

## 2016-01-12 VITALS — BP 128/96 | HR 103 | Temp 98.9°F | Ht 65.0 in | Wt 240.1 lb

## 2016-01-12 DIAGNOSIS — I10 Essential (primary) hypertension: Secondary | ICD-10-CM

## 2016-01-12 DIAGNOSIS — R7309 Other abnormal glucose: Secondary | ICD-10-CM | POA: Diagnosis not present

## 2016-01-12 DIAGNOSIS — E119 Type 2 diabetes mellitus without complications: Secondary | ICD-10-CM | POA: Diagnosis not present

## 2016-01-12 DIAGNOSIS — G4730 Sleep apnea, unspecified: Secondary | ICD-10-CM

## 2016-01-12 DIAGNOSIS — R739 Hyperglycemia, unspecified: Secondary | ICD-10-CM

## 2016-01-12 DIAGNOSIS — E669 Obesity, unspecified: Secondary | ICD-10-CM

## 2016-01-12 DIAGNOSIS — Z23 Encounter for immunization: Secondary | ICD-10-CM

## 2016-01-12 DIAGNOSIS — E1169 Type 2 diabetes mellitus with other specified complication: Secondary | ICD-10-CM

## 2016-01-12 DIAGNOSIS — K219 Gastro-esophageal reflux disease without esophagitis: Secondary | ICD-10-CM

## 2016-01-12 DIAGNOSIS — E782 Mixed hyperlipidemia: Secondary | ICD-10-CM | POA: Diagnosis not present

## 2016-01-12 DIAGNOSIS — Z0000 Encounter for general adult medical examination without abnormal findings: Secondary | ICD-10-CM

## 2016-01-12 DIAGNOSIS — E559 Vitamin D deficiency, unspecified: Secondary | ICD-10-CM

## 2016-01-12 MED ORDER — MOMETASONE FUROATE 50 MCG/ACT NA SUSP: 2.0000 | Freq: Every day | NASAL | Status: DC

## 2016-01-12 MED ORDER — FUROSEMIDE 20 MG PO TABS: 20.0000 mg | ORAL_TABLET | Freq: Every day | ORAL | Status: DC | PRN

## 2016-01-12 MED ORDER — LORATADINE 10 MG PO TABS: 10.0000 mg | ORAL_TABLET | Freq: Every day | ORAL | Status: DC

## 2016-01-12 NOTE — Assessment & Plan Note (Addendum)
Has only been taking one med should restart all. Encouraged heart healthy diet such as the DASH diet and exercise as tolerated.

## 2016-01-12 NOTE — Progress Notes (Signed)
Subjective:    Patient ID: Chelsea Bray, female    DOB: 26-Apr-1971, 45 y.o.   MRN: [id number]  Chief Complaint  Patient presents with  . Follow-up    HPI Patient is in today for follow up. Patient reports she is not taking her blood pressure medication, patient was using garlic and cayanne pills that was helping with blood pressure medication but ran out and reports she is going to go back to taking the otc vitamins to help maintain the blood pressure medication.  Patient reports sleep apnea and needs to get new machine will refer to pulmonology to get up to date sleep study. Patient also has some concerns about cyst on back that becomes bothersome also reports having some leg swelling during the summer when she has to do a lot of driving.  Past Medical History  Diagnosis Date  . Hyperlipidemia   . Hypertension   . Morbid obesity ([place]) 01/10/2013  . Acid reflux disease   . Preventative health care 11/29/2013  . Diabetes ([place]) 11/17/2011  . Diabetes mellitus type 2 in obese ([place]) 11/17/2011  . Vaginitis and vulvovaginitis 12/23/2014  . Pain in joint, upper arm 11/08/2015  . TOS (thoracic outlet syndrome) 11/20/2015    Past Surgical History  Procedure Laterality Date  . Gallbladder surgery  1991    gall stone removed  . Cerebral microvascular decompression  2006    back of her head  . Breast reduction surgery  2001  . Cholecystectomy    . Mole removal      Family History  Problem Relation Age of Onset  . Breast cancer Neg Hx   . Heart disease Maternal Grandmother     MI  . Stroke Maternal Grandmother   . Diabetes Maternal Grandmother   . Arrhythmia Mother   . Diabetes Mother     maternal grandmother and aunts  . Hypertension Mother   . Heart attack Father   . Stroke Father   . Hyperlipidemia Father   . Hypertension Father   . Cancer Father     lung, smoker  . Heart disease Father     CABG x 4  . Prostate cancer Maternal Grandfather   . Cancer Maternal Grandfather      colon cancer, prostate  . Hypertension      parents and maternal parents  . Alcohol abuse Paternal Grandmother     died of pneumonia  . Heart disease Paternal Grandfather     MI  . Asthma Daughter     allergies    Social History   Social History  . Marital Status: Married    Spouse Name: N/A  . Number of Children: N/A  . Years of Education: N/A   Occupational History  . Bus Driver    Social History Main Topics  . Smoking status: Never Smoker   . Smokeless tobacco: Never Used  . Alcohol Use: 0.0 oz/week    0 Standard drinks or equivalent per week     Comment: wine ocassion  . Drug Use: No  . Sexual Activity: Yes    Birth Control/ Protection: None     Comment: avoid dairy, lives with husband, daughters. works at school bus driver   Other Topics Concern  . Not on file   Social History Narrative    Outpatient Prescriptions Prior to Visit  Medication Sig Dispense Refill  . hydrochlorothiazide (HYDRODIURIL) 25 MG tablet Take 25 mg by mouth daily.    . Lancets (FREESTYLE) lancets  DX: 790.29 100 each 0  . metoprolol succinate (TOPROL-XL) 50 MG 24 hr tablet Take 50 mg by mouth daily. Take with or immediately following a meal.    . Vitamin D, Ergocalciferol, (DRISDOL) 50000 units CAPS capsule TAKE 1 CAPSULE BY MOUTH WEEKLY 12 capsule 0  . cetirizine (ZYRTEC) 10 MG tablet Take 1 tablet (10 mg total) by mouth daily. 30 tablet 11  . glucose monitoring kit (FREESTYLE) monitoring kit 1 each by Does not apply route 3 (three) times a week. Or any monitor that insurance will cover  DX:790.29 1 each 0  . methocarbamol (ROBAXIN) 500 MG tablet Take 1 tablet (500 mg total) by mouth at bedtime as needed for muscle spasms. 30 tablet 0  . Cholecalciferol (VITAMIN D3) 50000 UNITS CAPS Take 50,000 mg by mouth once a week. (Patient not taking: Reported on 01/12/2016) 5 capsule 5  . fluticasone (FLONASE) 50 MCG/ACT nasal spray USE 2 SPRAYS IN EACH NOSTRIL EVERY DAY 16 g 1  . gabapentin  (NEURONTIN) 100 MG capsule Take 1 capsule (100 mg total) by mouth 3 (three) times daily as needed. (Patient not taking: Reported on 01/12/2016) 270 capsule 0  . loratadine (CLARITIN) 10 MG tablet Take 1 tablet (10 mg total) by mouth daily. 30 tablet 3  . metFORMIN (GLUCOPHAGE) 500 MG tablet Take 1 tablet (500 mg total) by mouth 2 (two) times daily. (Patient not taking: Reported on 01/12/2016) 60 tablet 3  . modafinil (PROVIGIL) 100 MG tablet Take 1 tablet (100 mg total) by mouth daily as needed. 90 tablet 0  . mometasone (NASONEX) 50 MCG/ACT nasal spray Place 2 sprays into the nose daily. 17 g 12  . omeprazole (PRILOSEC) 40 MG capsule Take 1 capsule (40 mg total) by mouth 2 (two) times daily as needed. (Patient not taking: Reported on 01/17/2016)    . zolpidem (AMBIEN) 10 MG tablet Take 1 tablet (10 mg total) by mouth at bedtime as needed for sleep. (Patient not taking: Reported on 01/17/2016) 30 tablet 0   No facility-administered medications prior to visit.    No Known Allergies  Review of Systems  Constitutional: Negative for fever and malaise/fatigue.  HENT: Negative for congestion.   Eyes: Negative for blurred vision.  Respiratory: Negative for shortness of breath.   Cardiovascular: Negative for chest pain, palpitations and leg swelling.  Gastrointestinal: Negative for nausea, abdominal pain and blood in stool.  Genitourinary: Negative for dysuria and frequency.  Musculoskeletal: Negative for falls.  Skin: Negative for rash.  Neurological: Negative for dizziness, loss of consciousness and headaches.  Endo/Heme/Allergies: Negative for environmental allergies.  Psychiatric/Behavioral: Negative for depression. The patient is not nervous/anxious.        Objective:    Physical Exam  Constitutional: She is oriented to person, place, and time. She appears well-developed and well-nourished. No distress.  HENT:  Head: Normocephalic and atraumatic.  Eyes: Conjunctivae are normal.  Neck: Neck  supple. No thyromegaly present.  Cardiovascular: Normal rate, regular rhythm and normal heart sounds.   No murmur heard. Pulmonary/Chest: Effort normal and breath sounds normal. No respiratory distress.  Abdominal: Soft. Bowel sounds are normal. She exhibits no distension and no mass. There is no tenderness.  Musculoskeletal: She exhibits no edema.  Lymphadenopathy:    She has no cervical adenopathy.  Neurological: She is alert and oriented to person, place, and time.  Skin: Skin is warm and dry.  Small comedone upper back no surrounding fluctuance or warmth  Psychiatric: She has a normal mood and  affect. Her behavior is normal.    BP 128/96 mmHg  Pulse 103  Temp(Src) 98.9 F (37.2 C) (Oral)  Ht _0  (1.651 m)  Wt 240 lb 2 oz (108.92 kg)  BMI 39.96 kg/m2  SpO2 97% Wt Readings from Last 3 Encounters:  01/26/16 240 lb 12.8 oz (109.226 kg)  01/25/16 239 lb 12.8 oz (108.773 kg)  01/17/16 238 lb 12.8 oz (108.319 kg)     Lab Results  Component Value Date   WBC 6.7 11/08/2015   HGB 13.6 11/08/2015   HCT 39.8 11/08/2015   PLT 376.0 11/08/2015   GLUCOSE 163* 11/08/2015   CHOL 200 11/08/2015   TRIG 147.0 11/08/2015   HDL 40.30 11/08/2015   LDLCALC 130* 11/08/2015   ALT 23 11/08/2015   AST 17 11/08/2015   NA 138 11/08/2015   K 3.9 11/08/2015   CL 103 11/08/2015   CREATININE 0.84 11/08/2015   BUN 10 11/08/2015   CO2 28 11/08/2015   TSH 1.30 11/08/2015   HGBA1C 8.0* 11/08/2015    Lab Results  Component Value Date   TSH 1.30 11/08/2015   Lab Results  Component Value Date   WBC 6.7 11/08/2015   HGB 13.6 11/08/2015   HCT 39.8 11/08/2015   MCV 91.1 11/08/2015   PLT 376.0 11/08/2015   Lab Results  Component Value Date   NA 138 11/08/2015   K 3.9 11/08/2015   CO2 28 11/08/2015   GLUCOSE 163* 11/08/2015   BUN 10 11/08/2015   CREATININE 0.84 11/08/2015   BILITOT 0.3 11/08/2015   ALKPHOS 43 11/08/2015   AST 17 11/08/2015   ALT 23 11/08/2015   PROT 7.1 11/08/2015    ALBUMIN 4.1 11/08/2015   CALCIUM 9.0 11/08/2015   ANIONGAP 10 03/22/2015   GFR 94.12 11/08/2015   Lab Results  Component Value Date   CHOL 200 11/08/2015   Lab Results  Component Value Date   HDL 40.30 11/08/2015   Lab Results  Component Value Date   LDLCALC 130* 11/08/2015   Lab Results  Component Value Date   TRIG 147.0 11/08/2015   Lab Results  Component Value Date   CHOLHDL 5 11/08/2015   Lab Results  Component Value Date   HGBA1C 8.0* 11/08/2015       Assessment & Plan:   Problem List Items Addressed This Visit    Vitamin D deficiency    Continue supplements      Preventative health care   Relevant Orders   Hemoglobin A1c   TSH   CBC   Comprehensive metabolic panel   Urine Microalbumin w/creat. ratio   Lipid panel   Morbid obesity ([place])    Encouraged DASH diet, decrease po intake and increase exercise as tolerated. Needs 7-8 hours of sleep nightly. Avoid trans fats, eat small, frequent meals every 4-5 hours with lean proteins, complex carbs and healthy fats. Minimize simple carbs      Relevant Orders   Hemoglobin A1c   TSH   CBC   Comprehensive metabolic panel   Urine Microalbumin w/creat. ratio   Lipid panel   Hyperlipidemia, mixed    Encouraged heart healthy diet, increase exercise, avoid trans fats, consider a krill oil cap daily      Relevant Medications   furosemide (LASIX) 20 MG tablet   Other Relevant Orders   Hemoglobin A1c   TSH   CBC   Comprehensive metabolic panel   Urine Microalbumin w/creat. ratio   Lipid panel   GERD (gastroesophageal reflux  disease)    Avoid offending foods, start probiotics. Do not eat large meals in late evening and consider raising head of bed.       Essential hypertension - Primary    Has only been taking one med should restart all. Encouraged heart healthy diet such as the DASH diet and exercise as tolerated.       Relevant Medications   furosemide (LASIX) 20 MG tablet   RESOLVED: Elevated  blood sugar   Relevant Orders   Hemoglobin A1c   TSH   CBC   Comprehensive metabolic panel   Urine Microalbumin w/creat. ratio   Lipid panel   Diabetes mellitus type 2 in obese ([facility])   Relevant Orders   Hemoglobin A1c   TSH   CBC   Comprehensive metabolic panel   Urine Microalbumin w/creat. ratio   Lipid panel   Amb ref to Medical Nutrition Therapy-MNT    Other Visit Diagnoses    Sleep apnea        Relevant Orders    Ambulatory referral to Pulmonology    Need for 23-polyvalent pneumococcal polysaccharide vaccine        Need for vaccination with 13-polyvalent pneumococcal conjugate vaccine        Relevant Orders    Pneumococcal conjugate vaccine 13-valent IM (Completed)       I have discontinued Ms. Clinkenbeard Vitamin D3, cetirizine, modafinil, and fluticasone. I am also having her start on furosemide. Additionally, I am having her maintain her freestyle, glucose monitoring kit, metoprolol succinate, hydrochlorothiazide, methocarbamol, Vitamin D (Ergocalciferol), mometasone, and loratadine.  Meds ordered this encounter  Medications  . mometasone (NASONEX) 50 MCG/ACT nasal spray    Sig: Place 2 sprays into the nose daily.    Dispense:  17 g    Refill:  6  . loratadine (CLARITIN) 10 MG tablet    Sig: Take 1 tablet (10 mg total) by mouth daily.    Dispense:  30 tablet    Refill:  6  . furosemide (LASIX) 20 MG tablet    Sig: Take 1 tablet (20 mg total) by mouth daily as needed.    Dispense:  30 tablet    Refill:  2     Penni Homans, MD

## 2016-01-12 NOTE — Progress Notes (Signed)
Pre visit review using our clinic review tool, if applicable. No additional management support is needed unless otherwise documented below in the visit note. 

## 2016-01-12 NOTE — Patient Instructions (Addendum)
Clean cyst on back with witch hazel or hydrogen peroxide. Hypertension Hypertension, commonly called high blood pressure, is when the force of blood pumping through your arteries is too strong. Your arteries are the blood vessels that carry blood from your heart throughout your body. A blood pressure reading consists of a higher number over a lower number, such as 110/72. The higher number (systolic) is the pressure inside your arteries when your heart pumps. The lower number (diastolic) is the pressure inside your arteries when your heart relaxes. Ideally you want your blood pressure below 120/80. Hypertension forces your heart to work harder to pump blood. Your arteries may become narrow or stiff. Having untreated or uncontrolled hypertension can cause heart attack, stroke, kidney disease, and other problems. RISK FACTORS Some risk factors for high blood pressure are controllable. Others are not.  Risk factors you cannot control include:   Race. You may be at higher risk if you are African American.  Age. Risk increases with age.  Gender. Men are at higher risk than women before age 46 years. After age 46, women are at higher risk than men. Risk factors you can control include:  Not getting enough exercise or physical activity.  Being overweight.  Getting too much fat, sugar, calories, or salt in your diet.  Drinking too much alcohol. SIGNS AND SYMPTOMS Hypertension does not usually cause signs or symptoms. Extremely high blood pressure (hypertensive crisis) may cause headache, anxiety, shortness of breath, and nosebleed. DIAGNOSIS To check if you have hypertension, your health care provider will measure your blood pressure while you are seated, with your arm held at the level of your heart. It should be measured at least twice using the same arm. Certain conditions can cause a difference in blood pressure between your right and left arms. A blood pressure reading that is higher than  normal on one occasion does not mean that you need treatment. If it is not clear whether you have high blood pressure, you may be asked to return on a different day to have your blood pressure checked again. Or, you may be asked to monitor your blood pressure at home for 1 or more weeks. TREATMENT Treating high blood pressure includes making lifestyle changes and possibly taking medicine. Living a healthy lifestyle can help lower high blood pressure. You may need to change some of your habits. Lifestyle changes may include:  Following the DASH diet. This diet is high in fruits, vegetables, and whole grains. It is low in salt, red meat, and added sugars.  Keep your sodium intake below 2,300 mg per day.  Getting at least 30-45 minutes of aerobic exercise at least 4 times per week.  Losing weight if necessary.  Not smoking.  Limiting alcoholic beverages.  Learning ways to reduce stress. Your health care provider may prescribe medicine if lifestyle changes are not enough to get your blood pressure under control, and if one of the following is true:  You are 5318-46 years of age and your systolic blood pressure is above 140.  You are 46 years of age or older, and your systolic blood pressure is above 150.  Your diastolic blood pressure is above 90.  You have diabetes, and your systolic blood pressure is over 140 or your diastolic blood pressure is over 90.  You have kidney disease and your blood pressure is above 140/90.  You have heart disease and your blood pressure is above 140/90. Your personal target blood pressure may vary depending on your  medical conditions, your age, and other factors. HOME CARE INSTRUCTIONS  Have your blood pressure rechecked as directed by your health care provider.   Take medicines only as directed by your health care provider. Follow the directions carefully. Blood pressure medicines must be taken as prescribed. The medicine does not work as well when you  skip doses. Skipping doses also puts you at risk for problems.  Do not smoke.   Monitor your blood pressure at home as directed by your health care provider. SEEK MEDICAL CARE IF:   You think you are having a reaction to medicines taken.  You have recurrent headaches or feel dizzy.  You have swelling in your ankles.  You have trouble with your vision. SEEK IMMEDIATE MEDICAL CARE IF:  You develop a severe headache or confusion.  You have unusual weakness, numbness, or feel faint.  You have severe chest or abdominal pain.  You vomit repeatedly.  You have trouble breathing. MAKE SURE YOU:   Understand these instructions.  Will watch your condition.  Will get help right away if you are not doing well or get worse.   This information is not intended to replace advice given to you by your health care provider. Make sure you discuss any questions you have with your health care provider.   Document Released: 09/03/2005 Document Revised: 01/18/2015 Document Reviewed: 06/26/2013 Elsevier Interactive Patient Education [employer].

## 2016-01-17 ENCOUNTER — Ambulatory Visit (INDEPENDENT_AMBULATORY_CARE_PROVIDER_SITE_OTHER): Payer: BC Managed Care – PPO | Admitting: Pulmonary Disease

## 2016-01-17 ENCOUNTER — Encounter: Payer: Self-pay | Admitting: Pulmonary Disease

## 2016-01-17 VITALS — BP 126/86 | HR 91 | Ht 65.0 in | Wt 238.8 lb

## 2016-01-17 DIAGNOSIS — E669 Obesity, unspecified: Secondary | ICD-10-CM

## 2016-01-17 DIAGNOSIS — G4733 Obstructive sleep apnea (adult) (pediatric): Secondary | ICD-10-CM

## 2016-01-17 NOTE — Progress Notes (Signed)
Past Medical History She  has a past medical history of Hyperlipidemia; Hypertension; Morbid obesity ([place]) (01/10/2013); Acid reflux disease; Preventative health care (11/29/2013); Diabetes ([place]) (11/17/2011); Diabetes mellitus type 2 in obese ([place]) (11/17/2011); Vaginitis and vulvovaginitis (12/23/2014); Pain in joint, upper arm (11/08/2015); and TOS (thoracic outlet syndrome) (11/20/2015).  Past Surgical History She  has past surgical history that includes Gallbladder surgery (1991); Cerebral microvascular decompression (2006); Breast reduction surgery (2001); Cholecystectomy; and Mole removal.  Current Outpatient Prescriptions on File Prior to Visit  Medication Sig  . furosemide (LASIX) 20 MG tablet Take 1 tablet (20 mg total) by mouth daily as needed.  Marland Kitchen glucose monitoring kit (FREESTYLE) monitoring kit 1 each by Does not apply route 3 (three) times a week. Or any monitor that insurance will cover  DX:790.29  . hydrochlorothiazide (HYDRODIURIL) 25 MG tablet Take 25 mg by mouth daily.  . Lancets (FREESTYLE) lancets DX: 790.29  . loratadine (CLARITIN) 10 MG tablet Take 1 tablet (10 mg total) by mouth daily.  . methocarbamol (ROBAXIN) 500 MG tablet Take 1 tablet (500 mg total) by mouth at bedtime as needed for muscle spasms.  . metoprolol succinate (TOPROL-XL) 50 MG 24 hr tablet Take 50 mg by mouth daily. Take with or immediately following a meal.  . mometasone (NASONEX) 50 MCG/ACT nasal spray Place 2 sprays into the nose daily.  . Vitamin D, Ergocalciferol, (DRISDOL) 50000 units CAPS capsule TAKE 1 CAPSULE BY MOUTH WEEKLY   No current facility-administered medications on file prior to visit.    No Known Allergies  Family History Her family history includes Alcohol abuse in her paternal grandmother; Arrhythmia in her mother; Asthma in her daughter; Cancer in her father and maternal grandfather; Diabetes in her maternal grandmother and mother; Heart attack in her father; Heart disease in her father,  maternal grandmother, and paternal grandfather; Hyperlipidemia in her father; Hypertension in her father and mother; Prostate cancer in her maternal grandfather; Stroke in her father and maternal grandmother. There is no history of Breast cancer.  Social History She  reports that she has never smoked. She has never used smokeless tobacco. She reports that she drinks alcohol. She reports that she does not use illicit drugs.  Review of systems Review of Systems  Constitutional: Negative for fever and unexpected weight change.  HENT: Positive for congestion ( allergies) and sneezing. Negative for dental problem, ear pain, nosebleeds, postnasal drip, rhinorrhea, sinus pressure, sore throat and trouble swallowing.        Allergies  Eyes: Negative for redness and itching.  Respiratory: Negative for cough, chest tightness, shortness of breath and wheezing.   Cardiovascular: Negative for palpitations and leg swelling.  Gastrointestinal: Negative for nausea and vomiting.  Genitourinary: Negative for dysuria.  Musculoskeletal: Negative for joint swelling.  Skin: Negative for rash.  Neurological: Negative for headaches.  Hematological: Does not bruise/bleed easily.  Psychiatric/Behavioral: Negative for dysphoric mood. The patient is not nervous/anxious.     Chief Complaint  Patient presents with  . SLEEP CONSULT    Referred by Penni Homans. Sleep study 2008 in [place]. Current CPAP -unsure of pressure. Epworth Score: 14    Tests:  Vital signs BP 126/86 mmHg  Pulse 91  Ht _0  (1.651 m)  Wt 238 lb 12.8 oz (108.319 kg)  BMI 39.74 kg/m2  SpO2 98%  History of Present Illness Chelsea Bray is a 45 y.o. female for evaluation of sleep problems.  She had sleep study with [facility] in [facility].  She has been using CPAP since.  She needs new supplies and DME.  She has same CPAP since 2008, and has not received new supplies for at least 2 years.  She has been working with [employer].  She  doesn't feel like her machine is working like is should.  She contacted [employer], and was told she needs new sleep study to get new supplies.  She has been getting more sleepy during the day.  She doesn't snore if she uses CPAP, but her snoring is terrible if she sleeps w/o her machine.  She will nap for 30 minutes to an hour > doesn't always use CPAP with naps.  She works as a Recruitment consultant.  She goes to sleep at 11 pm.  She falls asleep after 20 minutes.  She wakes up one time to use the bathroom.  She gets out of bed at 5 am.  She feels tired in the morning.  She denies morning headache.  She does not use anything to help her fall sleep or stay awake.  She denies sleep walking, sleep talking, bruxism, or nightmares.  There is no history of restless legs.  She denies sleep hallucinations, sleep paralysis, or cataplexy.  The Epworth score is 14 out of 24.   Physical Exam:  General - No distress ENT - No sinus tenderness, no oral exudate, no LAN, no thyromegaly, TM clear, pupils equal/reactive, MP 3 Cardiac - s1s2 regular, no murmur, pulses symmetric Chest - No wheeze/rales/dullness, good air entry, normal respiratory excursion Back - No focal tenderness Abd - Soft, non-tender, no organomegaly, + bowel sounds Ext - No edema Neuro - Normal strength, cranial nerves intact Skin - No rashes Psych - Normal mood, and behavior  Discussion: She has prior history of sleep apnea.  She has been on same CPAP set up since 2008.  She has not received supplies from her DME for several years.  She has noticed more problems with daytime sleepiness in spite of using CPAP.  She does have a history of hypertension and her BMI is > 35.  We discussed how sleep apnea can affect various health problems, including risks for hypertension, cardiovascular disease, and diabetes.  We also discussed how sleep disruption can increase risks for accidents, such as while driving.  Weight loss as a means of improving sleep apnea  was also reviewed.  Additional treatment options discussed were CPAP therapy, oral appliance, and surgical intervention.  Assessment/plan:  Obstructive sleep apnea. - will arrange for home sleep study - will arrange for new CPAP set up after review of sleep study, assuming it shows she has sleep apnea  Obesity. - discussed options to assist with weight loss   Patient Instructions  Will arrange for home sleep study Will call to arrange for follow up after sleep study reviewed      Chesley Mires, M.D. Pager [phone] 01/17/2016, 3:26 PM

## 2016-01-17 NOTE — Patient Instructions (Signed)
Will arrange for home sleep study Will call to arrange for follow up after sleep study reviewed  

## 2016-01-17 NOTE — Progress Notes (Signed)
   Subjective:    Patient ID: Chelsea Bray, female    DOB: 08-16-1971, 45 y.o.   MRN: [id number]  HPI    Review of Systems  Constitutional: Negative for fever and unexpected weight change.  HENT: Positive for congestion ( allergies) and sneezing. Negative for dental problem, ear pain, nosebleeds, postnasal drip, rhinorrhea, sinus pressure, sore throat and trouble swallowing.        Allergies  Eyes: Negative for redness and itching.  Respiratory: Negative for cough, chest tightness, shortness of breath and wheezing.   Cardiovascular: Negative for palpitations and leg swelling.  Gastrointestinal: Negative for nausea and vomiting.  Genitourinary: Negative for dysuria.  Musculoskeletal: Negative for joint swelling.  Skin: Negative for rash.  Neurological: Negative for headaches.  Hematological: Does not bruise/bleed easily.  Psychiatric/Behavioral: Negative for dysphoric mood. The patient is not nervous/anxious.        Objective:   Physical Exam        Assessment & Plan:

## 2016-01-24 ENCOUNTER — Ambulatory Visit: Payer: BC Managed Care – PPO | Admitting: Family Medicine

## 2016-01-25 ENCOUNTER — Ambulatory Visit (INDEPENDENT_AMBULATORY_CARE_PROVIDER_SITE_OTHER): Payer: BC Managed Care – PPO | Admitting: Family Medicine

## 2016-01-25 ENCOUNTER — Encounter: Payer: Self-pay | Admitting: Family Medicine

## 2016-01-25 ENCOUNTER — Telehealth: Payer: Self-pay | Admitting: Pulmonary Disease

## 2016-01-25 VITALS — BP 108/72 | HR 74 | Temp 99.0°F | Ht 65.5 in | Wt 239.8 lb

## 2016-01-25 DIAGNOSIS — L723 Sebaceous cyst: Secondary | ICD-10-CM | POA: Diagnosis not present

## 2016-01-25 MED ORDER — NALTREXONE-BUPROPION HCL ER 8-90 MG PO TB12
ORAL_TABLET | ORAL | Status: DC
Start: 2016-01-25 — End: 2016-02-16

## 2016-01-25 MED ORDER — CEPHALEXIN 500 MG PO CAPS: 1000.0000 mg | ORAL_CAPSULE | Freq: Two times a day (BID) | ORAL | Status: DC

## 2016-01-25 NOTE — Telephone Encounter (Signed)
lmtcb x1 for pt. 

## 2016-01-25 NOTE — Patient Instructions (Signed)
Please see me tomorrow at 10 am to check on your wound Start the keflex antibiotic today You can start the contrave for obesity- start with 1 tablet in the am for one week, then 1 twice a day for one week, then 2 am and 1 pm for a week, then 2 tiwce a day

## 2016-01-25 NOTE — Progress Notes (Signed)
[place] at [facility] [address], [place], [place] [address] [phone]  Date:  01/25/2016   Name:  Chelsea Bray   DOB:  18-Jan-1971   MRN:  [id number]  PCP:  Penni Homans, MD    Chief Complaint: Follow-up   History of Present Illness:  Chelsea Bray is a 45 y.o. very pleasant female patient who presents with the following:  She has noted a cyst on her back- it has been there for nearly 2 years now. It has gradually gotten larger.  Her husband has watched it for her.  It was a bit tender at last visit with Dr. Charlett Blake in April. It is now larger and has felt painful off an on.    No chance of current pregnancy.  Patient Active Problem List   Diagnosis Date Noted  . Palpitations 11/20/2015  . TOS (thoracic outlet syndrome) 11/20/2015  . Pain in joint, upper arm 11/08/2015  . Acute bacterial sinusitis 01/18/2015  . Vaginitis and vulvovaginitis 12/23/2014  . Allergic rhinitis 11/18/2014  . Elevated blood sugar 11/30/2013  . Atypical chest pain 11/29/2013  . Vitamin D deficiency 11/29/2013  . Allergic state 11/29/2013  . Preventative health care 11/29/2013  . Carpal tunnel syndrome of right wrist 07/21/2013  . Headache(784.0) 03/11/2013  . Morbid obesity ([place]) 01/10/2013  . Eustachian tube dysfunction 08/13/2012  . Annual physical exam 06/13/2012  . Essential hypertension 06/13/2012  . Neuralgia 12/12/2011  . Diabetes mellitus type 2 in obese ([place]) 11/17/2011  . Back pain 11/16/2011  . Knee pain 09/29/2011  . Hyperlipidemia, mixed 09/29/2011  . GERD (gastroesophageal reflux disease) 09/29/2011  . OSA (obstructive sleep apnea) 09/29/2011    Past Medical History  Diagnosis Date  . Hyperlipidemia   . Hypertension   . Morbid obesity ([place]) 01/10/2013  . Acid reflux disease   . Preventative health care 11/29/2013  . Diabetes ([place]) 11/17/2011  . Diabetes mellitus type 2 in obese ([place]) 11/17/2011  . Vaginitis and  vulvovaginitis 12/23/2014  . Pain in joint, upper arm 11/08/2015  . TOS (thoracic outlet syndrome) 11/20/2015    Past Surgical History  Procedure Laterality Date  . Gallbladder surgery  1991    gall stone removed  . Cerebral microvascular decompression  2006    back of her head  . Breast reduction surgery  2001  . Cholecystectomy    . Mole removal      Social History  Substance Use Topics  . Smoking status: Never Smoker   . Smokeless tobacco: Never Used  . Alcohol Use: 0.0 oz/week    0 Standard drinks or equivalent per week     Comment: wine ocassion    Family History  Problem Relation Age of Onset  . Breast cancer Neg Hx   . Heart disease Maternal Grandmother     MI  . Stroke Maternal Grandmother   . Diabetes Maternal Grandmother   . Arrhythmia Mother   . Diabetes Mother     maternal grandmother and aunts  . Hypertension Mother   . Heart attack Father   . Stroke Father   . Hyperlipidemia Father   . Hypertension Father   . Cancer Father     lung, smoker  . Heart disease Father     CABG x 4  . Prostate cancer Maternal Grandfather   . Cancer Maternal Grandfather     colon cancer, prostate  . Hypertension  parents and maternal parents  . Alcohol abuse Paternal Grandmother     died of pneumonia  . Heart disease Paternal Grandfather     MI  . Asthma Daughter     allergies    No Known Allergies  Medication list has been reviewed and updated.  Current Outpatient Prescriptions on File Prior to Visit  Medication Sig Dispense Refill  . furosemide (LASIX) 20 MG tablet Take 1 tablet (20 mg total) by mouth daily as needed. 30 tablet 2  . glucose monitoring kit (FREESTYLE) monitoring kit 1 each by Does not apply route 3 (three) times a week. Or any monitor that insurance will cover  DX:790.29 1 each 0  . hydrochlorothiazide (HYDRODIURIL) 25 MG tablet Take 25 mg by mouth daily.    . Lancets (FREESTYLE) lancets DX: 790.29 100 each 0  . loratadine (CLARITIN) 10 MG  tablet Take 1 tablet (10 mg total) by mouth daily. 30 tablet 6  . methocarbamol (ROBAXIN) 500 MG tablet Take 1 tablet (500 mg total) by mouth at bedtime as needed for muscle spasms. 30 tablet 0  . metoprolol succinate (TOPROL-XL) 50 MG 24 hr tablet Take 50 mg by mouth daily. Take with or immediately following a meal.    . mometasone (NASONEX) 50 MCG/ACT nasal spray Place 2 sprays into the nose daily. 17 g 6  . Vitamin D, Ergocalciferol, (DRISDOL) 50000 units CAPS capsule TAKE 1 CAPSULE BY MOUTH WEEKLY 12 capsule 0   No current facility-administered medications on file prior to visit.    Review of Systems:  As per HPI- otherwise negative.   Physical Examination: Filed Vitals:   01/25/16 1048  BP: 108/72  Pulse: 74  Temp: 99 F (37.2 C)   Filed Vitals:   01/25/16 1048  Height: 5' 5.5" (1.664 m)  Weight: 239 lb 12.8 oz (108.773 kg)   Body mass index is 39.28 kg/(m^2). Ideal Body Weight: Weight in (lb) to have BMI = 25: 152.2  GEN: WDWN, NAD, Non-toxic, A & O x 3 HEENT: Atraumatic, Normocephalic. Neck supple. No masses, No LAD. Ears and Nose: No external deformity. CV: RRR, No M/G/R. No JVD. No thrill. No extra heart sounds. PULM: CTA B, no wheezes, crackles, rhonchi. No retractions. No resp. distress. No accessory muscle use. ABD: S, NT, ND, +BS. No rebound. No HSM. EXTR: No c/c/e NEURO Normal gait.  PSYCH: Normally interactive. Conversant. Not depressed or anxious appearing.  Calm demeanor.  There is a tender, inflamed sebacous cyst on her back at the nape of the neck, left of midline  VC obtained. Prepped with betadine.  Anesthesia with 100m of 2% lido. I and D with 11 blade.  Used forceps to probe wound and break up loculations. Removed copious sebaceous material and also cyst wall with forceps.  Packed iwht approx 2 inches of 1/2" packing and dressed  Assessment and Plan: Sebaceous cyst - Plan: cephALEXin (KEFLEX) 500 MG capsule  Morbid obesity, unspecified obesity type  [place] - Plan: Naltrexone-Bupropion HCl ER (CONTRAVE) 8-90 MG TB12  Here today for a sebaceous cyst which was recently painful and infected.  I and D, packed as above, start keflex.   RTC tomorrow for a wound check Pt also mentions that she had discussed contrave with her PCP- she is interested in trying this medication for weight loss.  No contraindications.  Will start this for her. She has an appt with Dr. BCharlett Blakein about 3 weeks for follow-up  Signed JLamar Blinks MD

## 2016-01-25 NOTE — Progress Notes (Signed)
Pre visit review using our clinic review tool, if applicable. No additional management support is needed unless otherwise documented below in the visit note. 

## 2016-01-26 ENCOUNTER — Encounter: Payer: Self-pay | Admitting: Family Medicine

## 2016-01-26 ENCOUNTER — Ambulatory Visit (INDEPENDENT_AMBULATORY_CARE_PROVIDER_SITE_OTHER): Payer: BC Managed Care – PPO | Admitting: Family Medicine

## 2016-01-26 VITALS — BP 124/87 | HR 86 | Temp 98.5°F | Ht 65.5 in | Wt 240.8 lb

## 2016-01-26 DIAGNOSIS — Z5189 Encounter for other specified aftercare: Secondary | ICD-10-CM

## 2016-01-26 NOTE — Patient Instructions (Signed)
In about 48 hours have your friend remove the bandage and the packing from the wound. Be sure that you see the packing so we know it is out! After that you can keep it covered with a band-aid as needed.  Let me know if any concerns!

## 2016-01-26 NOTE — Telephone Encounter (Signed)
Patient returned our call. 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 She would like for us to leave a detailed message on per phone [phone]. - prm

## 2016-01-26 NOTE — Telephone Encounter (Signed)
Spoke to pt her HST has been approved and she will be called soon the have the study done Tobe SosSally E Ottinger

## 2016-01-26 NOTE — Telephone Encounter (Signed)
Spoke with pt.  HST was ordered by Dr. Craige CottaSood.  Pt calling to check on status on whether insurance has approved this or will she need an in lab study.  PCCs, please advise. Thank you.  When calling pt back, if she doesn't answer, requesting to leave detailed msg on # [facility].  Thank you.

## 2016-01-26 NOTE — Progress Notes (Signed)
[place] at [facility] [address] [place], [place] [address] [phone]  Date:  01/26/2016   Name:  Chelsea Bray   DOB:  09-16-1971   MRN:  [id number]  PCP:  Penni Homans, MD    Chief Complaint: No chief complaint on file.   History of Present Illness:  Chelsea Bray is a 45 y.o. very pleasant female patient who presents with the following:  I did and I and D of a sebaceous cyst on her back yesterday. Here today for a wound check . She is doing well, no concerns. Minimal pain, tolerating keflex ok  Patient Active Problem List   Diagnosis Date Noted  . Palpitations 11/20/2015  . TOS (thoracic outlet syndrome) 11/20/2015  . Pain in joint, upper arm 11/08/2015  . Acute bacterial sinusitis 01/18/2015  . Vaginitis and vulvovaginitis 12/23/2014  . Allergic rhinitis 11/18/2014  . Elevated blood sugar 11/30/2013  . Atypical chest pain 11/29/2013  . Vitamin D deficiency 11/29/2013  . Allergic state 11/29/2013  . Preventative health care 11/29/2013  . Carpal tunnel syndrome of right wrist 07/21/2013  . Headache(784.0) 03/11/2013  . Morbid obesity ([place]) 01/10/2013  . Eustachian tube dysfunction 08/13/2012  . Annual physical exam 06/13/2012  . Essential hypertension 06/13/2012  . Neuralgia 12/12/2011  . Diabetes mellitus type 2 in obese ([place]) 11/17/2011  . Back pain 11/16/2011  . Knee pain 09/29/2011  . Hyperlipidemia, mixed 09/29/2011  . GERD (gastroesophageal reflux disease) 09/29/2011  . OSA (obstructive sleep apnea) 09/29/2011    Past Medical History  Diagnosis Date  . Hyperlipidemia   . Hypertension   . Morbid obesity ([place]) 01/10/2013  . Acid reflux disease   . Preventative health care 11/29/2013  . Diabetes ([place]) 11/17/2011  . Diabetes mellitus type 2 in obese ([place]) 11/17/2011  . Vaginitis and vulvovaginitis 12/23/2014  . Pain in joint, upper arm 11/08/2015  . TOS (thoracic outlet syndrome) 11/20/2015     Past Surgical History  Procedure Laterality Date  . Gallbladder surgery  1991    gall stone removed  . Cerebral microvascular decompression  2006    back of her head  . Breast reduction surgery  2001  . Cholecystectomy    . Mole removal      Social History  Substance Use Topics  . Smoking status: Never Smoker   . Smokeless tobacco: Never Used  . Alcohol Use: 0.0 oz/week    0 Standard drinks or equivalent per week     Comment: wine ocassion    Family History  Problem Relation Age of Onset  . Breast cancer Neg Hx   . Heart disease Maternal Grandmother     MI  . Stroke Maternal Grandmother   . Diabetes Maternal Grandmother   . Arrhythmia Mother   . Diabetes Mother     maternal grandmother and aunts  . Hypertension Mother   . Heart attack Father   . Stroke Father   . Hyperlipidemia Father   . Hypertension Father   . Cancer Father     lung, smoker  . Heart disease Father     CABG x 4  . Prostate cancer Maternal Grandfather   . Cancer Maternal Grandfather     colon cancer, prostate  . Hypertension      parents and maternal parents  . Alcohol abuse Paternal Grandmother     died of pneumonia  . Heart disease Paternal Grandfather  MI  . Asthma Daughter     allergies    No Known Allergies  Medication list has been reviewed and updated.  Current Outpatient Prescriptions on File Prior to Visit  Medication Sig Dispense Refill  . cephALEXin (KEFLEX) 500 MG capsule Take 2 capsules (1,000 mg total) by mouth 2 (two) times daily. 40 capsule 0  . furosemide (LASIX) 20 MG tablet Take 1 tablet (20 mg total) by mouth daily as needed. 30 tablet 2  . glucose monitoring kit (FREESTYLE) monitoring kit 1 each by Does not apply route 3 (three) times a week. Or any monitor that insurance will cover  DX:790.29 1 each 0  . hydrochlorothiazide (HYDRODIURIL) 25 MG tablet Take 25 mg by mouth daily.    . Lancets (FREESTYLE) lancets DX: 790.29 100 each 0  . loratadine (CLARITIN)  10 MG tablet Take 1 tablet (10 mg total) by mouth daily. 30 tablet 6  . methocarbamol (ROBAXIN) 500 MG tablet Take 1 tablet (500 mg total) by mouth at bedtime as needed for muscle spasms. 30 tablet 0  . metoprolol succinate (TOPROL-XL) 50 MG 24 hr tablet Take 50 mg by mouth daily. Take with or immediately following a meal.    . mometasone (NASONEX) 50 MCG/ACT nasal spray Place 2 sprays into the nose daily. 17 g 6  . Naltrexone-Bupropion HCl ER (CONTRAVE) 8-90 MG TB12 Taper up to 2 tablets twice a day.  Start with one tablet and add one tablet each week 120 tablet 3  . Vitamin D, Ergocalciferol, (DRISDOL) 50000 units CAPS capsule TAKE 1 CAPSULE BY MOUTH WEEKLY 12 capsule 0   No current facility-administered medications on file prior to visit.    Review of Systems:  As per HPI- otherwise negative.   Physical Examination: Filed Vitals:   01/26/16 1017  BP: 124/87  Pulse: 86  Temp: 98.5 F (36.9 C)    Ideal Body Weight:     GEN: WDWN, NAD, Non-toxic, Alert & Oriented x 3 HEENT: Atraumatic, Normocephalic.  Ears and Nose: No external deformity. EXTR: No clubbing/cyanosis/edema NEURO: Normal gait.  PSYCH: Normally interactive. Conversant. Not depressed or anxious appearing.  Calm demeanor.  Removed bandage and packing from wound on her left upper back. Expressed scant amount of sebaecous material- close to zero. Wound looks good. Repacked with 2in of 1/4" packing and dressed   Assessment and Plan: Encounter for wound care changed packing as above.  Due to weekend she will have a friend remove the packing for her. They will let me know if any other concerns    Signed Lamar Blinks, MD

## 2016-01-26 NOTE — Telephone Encounter (Signed)
lmomtcb for pt 

## 2016-01-29 NOTE — Assessment & Plan Note (Signed)
Continue supplements

## 2016-01-29 NOTE — Assessment & Plan Note (Signed)
Encouraged heart healthy diet, increase exercise, avoid trans fats, consider a krill oil cap daily 

## 2016-01-29 NOTE — Assessment & Plan Note (Signed)
Encouraged DASH diet, decrease po intake and increase exercise as tolerated. Needs 7-8 hours of sleep nightly. Avoid trans fats, eat small, frequent meals every 4-5 hours with lean proteins, complex carbs and healthy fats. Minimize simple carbs 

## 2016-01-29 NOTE — Assessment & Plan Note (Signed)
Avoid offending foods, start probiotics. Do not eat large meals in late evening and consider raising head of bed.  

## 2016-02-06 DIAGNOSIS — G4733 Obstructive sleep apnea (adult) (pediatric): Secondary | ICD-10-CM | POA: Diagnosis not present

## 2016-02-09 ENCOUNTER — Telehealth: Payer: Self-pay | Admitting: Pulmonary Disease

## 2016-02-09 DIAGNOSIS — G4733 Obstructive sleep apnea (adult) (pediatric): Secondary | ICD-10-CM

## 2016-02-09 NOTE — Telephone Encounter (Signed)
HST 02/06/16 >> AHI 30.4, SaO2 low 84%.  Will have my nurse inform pt that sleep study shows severe sleep apnea.  Options are 1) CPAP now, 2) ROV first.  If She is agreeable to CPAP, then please send order for auto CPAP range 5 to 15 cm H2O with heated humidity and mask of choice.  Have download sent 1 month after starting CPAP and set up ROV 2 months after starting CPAP.

## 2016-02-10 DIAGNOSIS — G4733 Obstructive sleep apnea (adult) (pediatric): Secondary | ICD-10-CM | POA: Diagnosis not present

## 2016-02-10 LAB — HM DIABETES EYE EXAM

## 2016-02-10 NOTE — Telephone Encounter (Signed)
Results have been explained to patient, pt expressed understanding. Order for CPAP placed. Pt scheduled for OV with VS on 04/16/2016 at 230. Nothing further needed.

## 2016-02-14 ENCOUNTER — Other Ambulatory Visit: Payer: Self-pay | Admitting: *Deleted

## 2016-02-14 ENCOUNTER — Telehealth: Payer: Self-pay | Admitting: Pulmonary Disease

## 2016-02-14 DIAGNOSIS — G4733 Obstructive sleep apnea (adult) (pediatric): Secondary | ICD-10-CM

## 2016-02-14 NOTE — Telephone Encounter (Signed)
[facility] [place] [facility] and [facility]. Nothing further needed at this time.

## 2016-02-16 ENCOUNTER — Other Ambulatory Visit (INDEPENDENT_AMBULATORY_CARE_PROVIDER_SITE_OTHER): Payer: BC Managed Care – PPO

## 2016-02-16 ENCOUNTER — Other Ambulatory Visit: Payer: Self-pay | Admitting: Family Medicine

## 2016-02-16 DIAGNOSIS — R7309 Other abnormal glucose: Secondary | ICD-10-CM

## 2016-02-16 DIAGNOSIS — E669 Obesity, unspecified: Secondary | ICD-10-CM | POA: Diagnosis not present

## 2016-02-16 DIAGNOSIS — R739 Hyperglycemia, unspecified: Secondary | ICD-10-CM

## 2016-02-16 DIAGNOSIS — Z0000 Encounter for general adult medical examination without abnormal findings: Secondary | ICD-10-CM | POA: Diagnosis not present

## 2016-02-16 DIAGNOSIS — E1169 Type 2 diabetes mellitus with other specified complication: Secondary | ICD-10-CM

## 2016-02-16 DIAGNOSIS — E782 Mixed hyperlipidemia: Secondary | ICD-10-CM | POA: Diagnosis not present

## 2016-02-16 DIAGNOSIS — E119 Type 2 diabetes mellitus without complications: Secondary | ICD-10-CM

## 2016-02-16 LAB — MICROALBUMIN / CREATININE URINE RATIO
CREATININE, U: 220.6 mg/dL
MICROALB UR: 1.4 mg/dL (ref 0.0–1.9)
Microalb Creat Ratio: 0.6 mg/g (ref 0.0–30.0)

## 2016-02-16 LAB — COMPREHENSIVE METABOLIC PANEL
ALBUMIN: 4 g/dL (ref 3.5–5.2)
ALT: 17 U/L (ref 0–35)
AST: 15 U/L (ref 0–37)
Alkaline Phosphatase: 45 U/L (ref 39–117)
BILIRUBIN TOTAL: 0.3 mg/dL (ref 0.2–1.2)
BUN: 12 mg/dL (ref 6–23)
CALCIUM: 9.5 mg/dL (ref 8.4–10.5)
CO2: 28 mEq/L (ref 19–32)
CREATININE: 0.88 mg/dL (ref 0.40–1.20)
Chloride: 103 mEq/L (ref 96–112)
GFR: 89.09 mL/min (ref >60.00)
Glucose, Bld: 167 mg/dL — ABNORMAL HIGH (ref 70–99)
Potassium: 4 mEq/L (ref 3.5–5.1)
SODIUM: 138 meq/L (ref 135–145)
TOTAL PROTEIN: 7.2 g/dL (ref 6.0–8.3)

## 2016-02-16 LAB — TSH: TSH: 1.5 u[IU]/mL (ref 0.35–4.50)

## 2016-02-16 LAB — HEMOGLOBIN A1C: Hgb A1c MFr Bld: 7.8 % — ABNORMAL HIGH (ref 4.6–6.5)

## 2016-02-16 LAB — LIPID PANEL
CHOL/HDL RATIO: 5
Cholesterol: 215 mg/dL — ABNORMAL HIGH (ref 0–200)
HDL: 41 mg/dL (ref >39.00)
LDL CALC: 151 mg/dL — AB (ref 0–99)
NONHDL: 174.37
Triglycerides: 119 mg/dL (ref 0.0–149.0)
VLDL: 23.8 mg/dL (ref 0.0–40.0)

## 2016-02-16 LAB — CBC
HCT: 39.8 % (ref 36.0–46.0)
Hemoglobin: 13.5 g/dL (ref 12.0–15.0)
MCHC: 33.8 g/dL (ref 30.0–36.0)
MCV: 91 fl (ref 78.0–100.0)
PLATELETS: 357 10*3/uL (ref 150.0–400.0)
RBC: 4.37 Mil/uL (ref 3.87–5.11)
RDW: 14 % (ref 11.5–15.5)
WBC: 8.5 10*3/uL (ref 4.0–10.5)

## 2016-02-16 MED ORDER — NALTREXONE-BUPROPION HCL ER 8-90 MG PO TB12: ORAL_TABLET | ORAL | Status: DC

## 2016-02-16 NOTE — Telephone Encounter (Signed)
Relation to pt: self Call back number:[phone] Pharmacy: [facility] - [place], [place] - [address] [phone] ([phone])         Reason for call:  Patient states pharmacy informed her on 01/25/16 that Naltrexone-Bupropion HCl ER (CONTRAVE) 8-90 MG TB12 was awaiting authrization from her PCP. Patient requesting a refill Vitamin D, Ergocalciferol, (DRISDOL) 50000 units CAPS capsule

## 2016-02-16 NOTE — Telephone Encounter (Signed)
Patients last vitamin d checked in April of 2016 and has not been rechecked.

## 2016-02-21 ENCOUNTER — Ambulatory Visit: Payer: BC Managed Care – PPO

## 2016-02-24 ENCOUNTER — Telehealth: Payer: Self-pay | Admitting: *Deleted

## 2016-02-24 NOTE — Telephone Encounter (Signed)
PA for Contrave approved effective 01/25/2016 through 06/23/2016. Case Id: [id number]

## 2016-02-28 ENCOUNTER — Ambulatory Visit: Payer: BC Managed Care – PPO

## 2016-03-06 ENCOUNTER — Ambulatory Visit: Payer: BC Managed Care – PPO

## 2016-03-08 ENCOUNTER — Encounter: Payer: Self-pay | Admitting: Family Medicine

## 2016-03-08 ENCOUNTER — Ambulatory Visit (INDEPENDENT_AMBULATORY_CARE_PROVIDER_SITE_OTHER): Payer: BC Managed Care – PPO | Admitting: Family Medicine

## 2016-03-08 VITALS — BP 118/86 | HR 99 | Temp 98.5°F | Ht 65.0 in | Wt 237.2 lb

## 2016-03-08 DIAGNOSIS — G4700 Insomnia, unspecified: Secondary | ICD-10-CM | POA: Diagnosis not present

## 2016-03-08 DIAGNOSIS — H6980 Other specified disorders of Eustachian tube, unspecified ear: Secondary | ICD-10-CM

## 2016-03-08 DIAGNOSIS — K219 Gastro-esophageal reflux disease without esophagitis: Secondary | ICD-10-CM | POA: Diagnosis not present

## 2016-03-08 DIAGNOSIS — E1169 Type 2 diabetes mellitus with other specified complication: Secondary | ICD-10-CM

## 2016-03-08 DIAGNOSIS — E119 Type 2 diabetes mellitus without complications: Secondary | ICD-10-CM | POA: Diagnosis not present

## 2016-03-08 DIAGNOSIS — E782 Mixed hyperlipidemia: Secondary | ICD-10-CM

## 2016-03-08 DIAGNOSIS — E669 Obesity, unspecified: Secondary | ICD-10-CM

## 2016-03-08 DIAGNOSIS — M545 Low back pain: Secondary | ICD-10-CM

## 2016-03-08 DIAGNOSIS — G4733 Obstructive sleep apnea (adult) (pediatric): Secondary | ICD-10-CM

## 2016-03-08 DIAGNOSIS — I10 Essential (primary) hypertension: Secondary | ICD-10-CM

## 2016-03-08 HISTORY — DX: Insomnia, unspecified: G47.00

## 2016-03-08 MED ORDER — METFORMIN HCL 500 MG PO TABS: 500.0000 mg | ORAL_TABLET | Freq: Two times a day (BID) | ORAL | Status: DC

## 2016-03-08 NOTE — Assessment & Plan Note (Signed)
Encouraged good sleep hygiene such as dark, quiet room. No blue/green glowing lights such as computer screens in bedroom. No alcohol or stimulants in evening. Cut down on caffeine as able. Regular exercise is helpful but not just prior to bed time.  

## 2016-03-08 NOTE — Patient Instructions (Addendum)
Melatonin 2-10 mg capsules at bed, 1/2 hour before L Tyrptophan capsules  Sominex OTC Salon pas, aspercreme patch Lidocaine  DASH Eating Plan DASH stands for "Dietary Approaches to Stop Hypertension." The DASH eating plan is a healthy eating plan that has been shown to reduce high blood pressure (hypertension). Additional health benefits may include reducing the risk of type 2 diabetes mellitus, heart disease, and stroke. The DASH eating plan may also help with weight loss. WHAT DO I NEED TO KNOW ABOUT THE DASH EATING PLAN? For the DASH eating plan, you will follow these general guidelines:  Choose foods with a percent daily value for sodium of less than 5% (as listed on the food label).  Use salt-free seasonings or herbs instead of table salt or sea salt.  Check with your health care provider or pharmacist before using salt substitutes.  Eat lower-sodium products, often labeled as "lower sodium" or "no salt added."  Eat fresh foods.  Eat more vegetables, fruits, and low-fat dairy products.  Choose whole grains. Look for the word "whole" as the first word in the ingredient list.  Choose fish and skinless chicken or [place] more often than red meat. Limit fish, poultry, and meat to 6 oz (170 g) each day.  Limit sweets, desserts, sugars, and sugary drinks.  Choose heart-healthy fats.  Limit cheese to 1 oz (28 g) per day.  Eat more home-cooked food and less restaurant, buffet, and fast food.  Limit fried foods.  Cook foods using methods other than frying.  Limit canned vegetables. If you do use them, rinse them well to decrease the sodium.  When eating at a restaurant, ask that your food be prepared with less salt, or no salt if possible. WHAT FOODS CAN I EAT? Seek help from a dietitian for individual calorie needs. Grains Whole grain or whole wheat bread. Brown rice. Whole grain or whole wheat pasta. Quinoa, bulgur, and whole grain cereals. Low-sodium cereals. Corn or whole  wheat flour tortillas. Whole grain cornbread. Whole grain crackers. Low-sodium crackers. Vegetables Fresh or frozen vegetables (raw, steamed, roasted, or grilled). Low-sodium or reduced-sodium tomato and vegetable juices. Low-sodium or reduced-sodium tomato sauce and paste. Low-sodium or reduced-sodium canned vegetables.  Fruits All fresh, canned (in natural juice), or frozen fruits. Meat and Other Protein Products Ground beef (85% or leaner), grass-fed beef, or beef trimmed of fat. Skinless chicken or [place]. Ground chicken or [place]. Pork trimmed of fat. All fish and seafood. Eggs. Dried beans, peas, or lentils. Unsalted nuts and seeds. Unsalted canned beans. Dairy Low-fat dairy products, such as skim or 1% milk, 2% or reduced-fat cheeses, low-fat ricotta or cottage cheese, or plain low-fat yogurt. Low-sodium or reduced-sodium cheeses. Fats and Oils Tub margarines without trans fats. Light or reduced-fat mayonnaise and salad dressings (reduced sodium). Avocado. Safflower, olive, or canola oils. Natural peanut or almond butter. Other Unsalted popcorn and pretzels. The items listed above may not be a complete list of recommended foods or beverages. Contact your dietitian for more options. WHAT FOODS ARE NOT RECOMMENDED? Grains White bread. White pasta. White rice. Refined cornbread. Bagels and croissants. Crackers that contain trans fat. Vegetables Creamed or fried vegetables. Vegetables in a cheese sauce. Regular canned vegetables. Regular canned tomato sauce and paste. Regular tomato and vegetable juices. Fruits Dried fruits. Canned fruit in light or heavy syrup. Fruit juice. Meat and Other Protein Products Fatty cuts of meat. Ribs, chicken wings, bacon, sausage, bologna, salami, chitterlings, fatback, hot dogs, bratwurst, and packaged luncheon meats. Salted nuts  and seeds. Canned beans with salt. Dairy Whole or 2% milk, cream, half-and-half, and cream cheese. Whole-fat or sweetened yogurt.  Full-fat cheeses or blue cheese. Nondairy creamers and whipped toppings. Processed cheese, cheese spreads, or cheese curds. Condiments Onion and garlic salt, seasoned salt, table salt, and sea salt. Canned and packaged gravies. Worcestershire sauce. Tartar sauce. Barbecue sauce. Teriyaki sauce. Soy sauce, including reduced sodium. Steak sauce. Fish sauce. Oyster sauce. Cocktail sauce. Horseradish. Ketchup and mustard. Meat flavorings and tenderizers. Bouillon cubes. Hot sauce. Tabasco sauce. Marinades. Taco seasonings. Relishes. Fats and Oils Butter, stick margarine, lard, shortening, ghee, and bacon fat. Coconut, palm kernel, or palm oils. Regular salad dressings. Other Pickles and olives. Salted popcorn and pretzels. The items listed above may not be a complete list of foods and beverages to avoid. Contact your dietitian for more information. WHERE CAN I FIND MORE INFORMATION? National Heart, Lung, and [employer] Institute: Cable[employer].itwww.nhlbi.nih.gov/health/health-topics/topics/dash/   This information is not intended to replace advice given to you by your health care provider. Make sure you discuss any questions you have with your health care provider.   Document Released: 08/23/2011 Document Revised: 09/24/2014 Document Reviewed: 07/08/2013 Elsevier Interactive Patient Education 2016 Elsevier Inc.     Insomnia Insomnia is a sleep disorder that makes it difficult to fall asleep or to stay asleep. Insomnia can cause tiredness (fatigue), low energy, difficulty concentrating, mood swings, and poor performance at work or school.  There are three different ways to classify insomnia:  Difficulty falling asleep.  Difficulty staying asleep.  Waking up too early in the morning. Any type of insomnia can be long-term (chronic) or short-term (acute). Both are common. Short-term insomnia usually lasts for three months or less. Chronic insomnia occurs at least three times a week for longer than three  months. CAUSES  Insomnia may be caused by another condition, situation, or substance, such as:  Anxiety.  Certain medicines.  Gastroesophageal reflux disease (GERD) or other gastrointestinal conditions.  Asthma or other breathing conditions.  Restless legs syndrome, sleep apnea, or other sleep disorders.  Chronic pain.  Menopause. This may include hot flashes.  Stroke.  Abuse of alcohol, tobacco, or illegal drugs.  Depression.  Caffeine.   Neurological disorders, such as Alzheimer disease.  An overactive thyroid (hyperthyroidism). The cause of insomnia may not be known. RISK FACTORS Risk factors for insomnia include:  Gender. Women are more commonly affected than men.  Age. Insomnia is more common as you get older.  Stress. This may involve your professional or personal life.  Income. Insomnia is more common in people with lower income.  Lack of exercise.   Irregular work schedule or night shifts.  Traveling between different time zones. SIGNS AND SYMPTOMS If you have insomnia, trouble falling asleep or trouble staying asleep is the main symptom. This may lead to other symptoms, such as:  Feeling fatigued.  Feeling nervous about going to sleep.  Not feeling rested in the morning.  Having trouble concentrating.  Feeling irritable, anxious, or depressed. TREATMENT  Treatment for insomnia depends on the cause. If your insomnia is caused by an underlying condition, treatment will focus on addressing the condition. Treatment may also include:   Medicines to help you sleep.  Counseling or therapy.  Lifestyle adjustments. HOME CARE INSTRUCTIONS   Take medicines only as directed by your health care provider.  Keep regular sleeping and waking hours. Avoid naps.  Keep a sleep diary to help you and your health care provider figure out what could be  causing your insomnia. Include:   When you sleep.  When you wake up during the night.  How well you  sleep.   How rested you feel the next day.  Any side effects of medicines you are taking.  What you eat and drink.   Make your bedroom a comfortable place where it is easy to fall asleep:  Put up shades or special blackout curtains to block light from outside.  Use a white noise machine to block noise.  Keep the temperature cool.   Exercise regularly as directed by your health care provider. Avoid exercising right before bedtime.  Use relaxation techniques to manage stress. Ask your health care provider to suggest some techniques that may work well for you. These may include:  Breathing exercises.  Routines to release muscle tension.  Visualizing peaceful scenes.  Cut back on alcohol, caffeinated beverages, and cigarettes, especially close to bedtime. These can disrupt your sleep.  Do not overeat or eat spicy foods right before bedtime. This can lead to digestive discomfort that can make it hard for you to sleep.  Limit screen use before bedtime. This includes:  Watching TV.  Using your smartphone, tablet, and computer.  Stick to a routine. This can help you fall asleep faster. Try to do a quiet activity, brush your teeth, and go to bed at the same time each night.  Get out of bed if you are still awake after 15 minutes of trying to sleep. Keep the lights down, but try reading or doing a quiet activity. When you feel sleepy, go back to bed.  Make sure that you drive carefully. Avoid driving if you feel very sleepy.  Keep all follow-up appointments as directed by your health care provider. This is important. SEEK MEDICAL CARE IF:   You are tired throughout the day or have trouble in your daily routine due to sleepiness.  You continue to have sleep problems or your sleep problems get worse. SEEK IMMEDIATE MEDICAL CARE IF:   You have serious thoughts about hurting yourself or someone else.   This information is not intended to replace advice given to you by your  health care provider. Make sure you discuss any questions you have with your health care provider.   Document Released: 08/31/2000 Document Revised: 05/25/2015 Document Reviewed: 06/04/2014 Elsevier Interactive Patient Education 2016 Elsevier Inc. Thoracic Outlet Syndrome Thoracic outlet syndrome (TOS) is a group of signs and symptoms that result when the vein, artery, or nerves that supply your arm and hand are squeezed (compressed). To reach your arm, all of these have to pass through a tight space under your collarbone and above your top rib (thoracic outlet). There are three types of TOS:  Compression of the nerves that supply your arm and hand is called neurogenic TOS. Most people with TOS have this type.  Compression of the vein that returns blood from your arm and hand (subclavian vein) is called venous TOS.  Compression of the artery that carries blood to your arm and hand (subclavian artery) is called arterial TOS. Arterial TOS is the rarest type. Depending on which structures are affected, you may have symptoms on one side or both sides of your body. CAUSES  Neurogenic TOS may be caused by swelling or scarring in your neck muscles that results in the narrowing of your thoracic outlet. This leads to nerve compression. It can happen from:  Neck injuries from an auto accident (whiplash).  Falls.  Repetitive stress on your neck from  working with your arms. This stress could be from using a keyboard all day or working on an assembly line.  Venous TOS may be caused by doing hard work with your arms, especially if you have to lift your arms above your head. A blood clot may form in the vein.  Arterial TOS may be caused by having an extra rib at the base of your neck (cervical rib). This rib presses on your subclavian artery. Over time, this pressure may cause a clot to form inside the artery, or the artery may weaken and balloon outward (aneurysm). RISK FACTORS  You may be at greater  risk for neurogenic TOS after a neck injury or repetitive stress on your neck.  You may be at greater risk for venous TOS if you do strenuous and repetitive work with your arms.  You may be at greater risk for arterial TOS if you were born with a cervical rib. Risk factors for any type of TOS include:  Being female.  Being overweight.  Having poor posture. SIGNS AND SYMPTOMS  Your signs and symptoms will depend on the type of TOS that you have. Signs and symptoms of neurogenic TOS may include:  Pain in your shoulder, arm, or hand.  Tingling or numbness in your shoulder, arm, or hand.  Tiredness or weakness of your shoulder, arm, or hand.  Neck pain.  Headache. Signs and symptoms of venous TOS may include:  Pain and swelling of your whole arm.  Arm skin that is darker than usual. Signs and symptoms of arterial TOS may include:  Pain and cramps in your arm or hand.  Pale arm skin.  Very cold hands. All signs and symptoms of TOS may be worse when you hold your arms over your head. DIAGNOSIS Your health care provider may suspect TOS from your symptoms. A physical exam will be done. During the exam, your health care provider may ask you to hold your arms over your head to check whether your symptoms get worse. Tests may also be done to confirm the diagnosis and to find out what is causing TOS. These may include:  Imaging studies, such as:  X-rays to look for a cervical rib.  A test using sound waves to create an image (ultrasound).  CT scan.  MRI.  A test that involves measuring and recording the pulses in your wrists (pulse volume recording).  A test that involves measuring the conduction speed of nerve impulses in your arm (nerve conduction velocity test).  A test in which X-rays are done after dye is injected into your subclavian artery or vein (venography or arteriography). TREATMENT  Treatment depends on the type of TOS that you have.  Neurogenic TOS may be  treated with:  Physical therapy to learn stretching exercises and good posture.  Occupational therapy to improve your workplace and home environment.  Medicine, including pain medicine, muscle relaxants, and anti-inflammatory medicine.  Surgery to remove scarred neck muscles or the first rib. This is rarely done for this type of TOS.  Venous TOS may be treated with:  Medicine, including blood thinners or blood clot dissolvers.  Surgery to remove a blood clot.  Surgery to remove the uppermost rib to make more space in the thoracic outlet.  Arterial TOS may be treated with surgery to:  Remove the cervical rib.  Remove a blood clot (thrombus).  Repair an aneurysm. HOME CARE INSTRUCTIONS  Take medicines only as directed by your health care provider.  Maintain a healthy  weight. Lose weight as directed by your health care provider.  Do stretching exercises at home as directed by your health care provider or physical therapist.  Maintain good posture.  Do not carry heavy bags over your shoulder.  Do not repetitively lift heavy objects over your head.  Take frequent breaks to stretch and rest your arms if you work at a keyboard or do other repetitive work with your hands and arms.  Keep all follow-up visits as directed by your health care provider. This is important. SEEK MEDICAL CARE IF:  You have pain, cramps, numbness, or tingling in your arm or hand.  Your arm or hand frequently feels tired.  Your arm develops a darker skin color than usual.  Your hand feels cold.  You have frequent headaches or neckaches. SEEK IMMEDIATE MEDICAL CARE IF:   You lose feeling in your arm or hand.  You are unable to move your fingers.  Your fingers turn a dark color.   This information is not intended to replace advice given to you by your health care provider. Make sure you discuss any questions you have with your health care provider.   Document Released: 08/24/2002 Document  Revised: 09/24/2014 Document Reviewed: 02/03/2014 Elsevier Interactive Patient Education [employer] Inc[employer]

## 2016-03-08 NOTE — Progress Notes (Signed)
Pre visit review using our clinic review tool, if applicable. No additional management support is needed unless otherwise documented below in the visit note. 

## 2016-03-18 NOTE — Assessment & Plan Note (Signed)
cpap is helping fatigue some although she still struggles with getting deep sleep. Encouraged good sleep hygiene such as dark, quiet room. No blue/green glowing lights such as computer screens in bedroom. No alcohol or stimulants in evening. Cut down on caffeine as able. Regular exercise is helpful but not just prior to bed time.

## 2016-03-18 NOTE — Assessment & Plan Note (Signed)
Well controlled, no changes to meds. Encouraged heart healthy diet such as the DASH diet and exercise as tolerated.  °

## 2016-03-18 NOTE — Assessment & Plan Note (Signed)
Avoid offending foods, start probiotics. Do not eat large meals in late evening and consider raising head of bed.  

## 2016-03-18 NOTE — Assessment & Plan Note (Signed)
Encouraged DASH diet, decrease po intake and increase exercise as tolerated. Needs 7-8 hours of sleep nightly. Avoid trans fats, eat small, frequent meals every 4-5 hours with lean proteins, complex carbs and healthy fats. Minimize simple carbs. Will try Contrave

## 2016-03-18 NOTE — Assessment & Plan Note (Signed)
Encouraged moist heat and gentle stretching as tolerated. May try NSAIDs and prescription meds as directed and report if symptoms worsen or seek immediate care. May continue muscle relaxers prn

## 2016-03-18 NOTE — Progress Notes (Signed)
Patient ID: TORRA PALA, female   DOB: Jan 26, 1971, 45 y.o.   MRN: [id number]   Subjective:    Patient ID: [name], female    DOB: [birth year], 45 y.o.   MRN: [id number]  Chief Complaint  Patient presents with  . Follow-up    HPI Patient is in today for follow up. She is using CPAP regularly and reports it is helping her but she still struggles with poor quality sleep and fatigue some. No recent illness or hospitalizations. Is struggling with neck pain, muscle relaxers are helpful. Denies CP/palp/SOB/HA/congestion/fevers/GI or GU c/o. Taking meds as prescribed  Past Medical History  Diagnosis Date  . Hyperlipidemia   . Hypertension   . Morbid obesity ([place]) 01/10/2013  . Acid reflux disease   . Preventative health care 11/29/2013  . Diabetes ([place]) 11/17/2011  . Diabetes mellitus type 2 in obese ([place]) 11/17/2011  . Vaginitis and vulvovaginitis 12/23/2014  . Pain in joint, upper arm 11/08/2015  . TOS (thoracic outlet syndrome) 11/20/2015  . Insomnia 03/08/2016    Past Surgical History  Procedure Laterality Date  . Gallbladder surgery  1991    gall stone removed  . Cerebral microvascular decompression  2006    back of her head  . Breast reduction surgery  2001  . Cholecystectomy    . Mole removal      Family History  Problem Relation Age of Onset  . Breast cancer Neg Hx   . Heart disease Maternal Grandmother     MI  . Stroke Maternal Grandmother   . Diabetes Maternal Grandmother   . Arrhythmia Mother   . Diabetes Mother     maternal grandmother and aunts  . Hypertension Mother   . Heart attack Father   . Stroke Father   . Hyperlipidemia Father   . Hypertension Father   . Cancer Father     lung, smoker  . Heart disease Father     CABG x 4  . Prostate cancer Maternal Grandfather   . Cancer Maternal Grandfather     colon cancer, prostate  . Hypertension      parents and maternal parents  . Alcohol abuse Paternal Grandmother     died of pneumonia  . Heart  disease Paternal Grandfather     MI  . Asthma Daughter     allergies    Social History   Social History  . Marital Status: Married    Spouse Name: N/A  . Number of Children: N/A  . Years of Education: N/A   Occupational History  . Bus Driver    Social History Main Topics  . Smoking status: Never Smoker   . Smokeless tobacco: Never Used  . Alcohol Use: 0.0 oz/week    0 Standard drinks or equivalent per week     Comment: wine ocassion  . Drug Use: No  . Sexual Activity: Yes    Birth Control/ Protection: None     Comment: avoid dairy, lives with husband, daughters. works at school bus driver   Other Topics Concern  . Not on file   Social History Narrative    Outpatient Prescriptions Prior to Visit  Medication Sig Dispense Refill  . furosemide (LASIX) 20 MG tablet Take 1 tablet (20 mg total) by mouth daily as needed. 30 tablet 2  . glucose monitoring kit (FREESTYLE) monitoring kit 1 each by Does not apply route 3 (three) times a week. Or any monitor that insurance will cover  DX:790.29 1 each 0  .  hydrochlorothiazide (HYDRODIURIL) 25 MG tablet Take 25 mg by mouth daily.    . Lancets (FREESTYLE) lancets DX: 790.29 100 each 0  . loratadine (CLARITIN) 10 MG tablet Take 1 tablet (10 mg total) by mouth daily. 30 tablet 6  . methocarbamol (ROBAXIN) 500 MG tablet Take 1 tablet (500 mg total) by mouth at bedtime as needed for muscle spasms. 30 tablet 0  . metoprolol succinate (TOPROL-XL) 50 MG 24 hr tablet Take 50 mg by mouth daily. Take with or immediately following a meal.    . mometasone (NASONEX) 50 MCG/ACT nasal spray Place 2 sprays into the nose daily. 17 g 6  . Naltrexone-Bupropion HCl ER (CONTRAVE) 8-90 MG TB12 Taper up to 2 tablets twice a day.  Start with one tablet and add one tablet each week 120 tablet 3  . Vitamin D, Ergocalciferol, (DRISDOL) 50000 units CAPS capsule TAKE 1 CAPSULE BY MOUTH WEEKLY 12 capsule 0  . cephALEXin (KEFLEX) 500 MG capsule Take 2 capsules  (1,000 mg total) by mouth 2 (two) times daily. 40 capsule 0   No facility-administered medications prior to visit.    No Known Allergies  Review of Systems  Constitutional: Positive for malaise/fatigue. Negative for fever.  HENT: Negative for congestion.   Eyes: Negative for blurred vision.  Respiratory: Negative for shortness of breath.   Cardiovascular: Negative for chest pain, palpitations and leg swelling.  Gastrointestinal: Negative for nausea, abdominal pain and blood in stool.  Genitourinary: Negative for dysuria and frequency.  Musculoskeletal: Positive for back pain. Negative for falls.  Skin: Negative for rash.  Neurological: Negative for dizziness, loss of consciousness and headaches.  Endo/Heme/Allergies: Negative for environmental allergies.  Psychiatric/Behavioral: Negative for depression. The patient is not nervous/anxious.        Objective:    Physical Exam  Constitutional: She is oriented to person, place, and time. She appears well-developed and well-nourished. No distress.  HENT:  Head: Normocephalic and atraumatic.  Nose: Nose normal.  Eyes: Right eye exhibits no discharge. Left eye exhibits no discharge.  Neck: Normal range of motion. Neck supple.  Cardiovascular: Normal rate and regular rhythm.   No murmur heard. Pulmonary/Chest: Effort normal and breath sounds normal.  Abdominal: Soft. Bowel sounds are normal. There is no tenderness.  Musculoskeletal: She exhibits no edema.  Neurological: She is alert and oriented to person, place, and time.  Skin: Skin is warm and dry.  Psychiatric: She has a normal mood and affect.  Nursing note and vitals reviewed.   BP 118/86 mmHg  Pulse 99  Temp(Src) 98.5 F (36.9 C) (Oral)  Ht 5\' 5"  (1.651 m)  Wt 237 lb 4 oz (107.616 kg)  BMI 39.48 kg/m2  SpO2 99% Wt Readings from Last 3 Encounters:  03/08/16 237 lb 4 oz (107.616 kg)  01/26/16 240 lb 12.8 oz (109.226 kg)  01/25/16 239 lb 12.8 oz (108.773 kg)      Lab Results  Component Value Date   WBC 8.5 02/16/2016   HGB 13.5 02/16/2016   HCT 39.8 02/16/2016   PLT 357.0 02/16/2016   GLUCOSE 167* 02/16/2016   CHOL 215* 02/16/2016   TRIG 119.0 02/16/2016   HDL 41.00 02/16/2016   LDLCALC 151* 02/16/2016   ALT 17 02/16/2016   AST 15 02/16/2016   NA 138 02/16/2016   K 4.0 02/16/2016   CL 103 02/16/2016   CREATININE 0.88 02/16/2016   BUN 12 02/16/2016   CO2 28 02/16/2016   TSH 1.50 02/16/2016   HGBA1C 7.8* 02/16/2016  MICROALBUR 1.4 02/16/2016    Lab Results  Component Value Date   TSH 1.50 02/16/2016   Lab Results  Component Value Date   WBC 8.5 02/16/2016   HGB 13.5 02/16/2016   HCT 39.8 02/16/2016   MCV 91.0 02/16/2016   PLT 357.0 02/16/2016   Lab Results  Component Value Date   NA 138 02/16/2016   K 4.0 02/16/2016   CO2 28 02/16/2016   GLUCOSE 167* 02/16/2016   BUN 12 02/16/2016   CREATININE 0.88 02/16/2016   BILITOT 0.3 02/16/2016   ALKPHOS 45 02/16/2016   AST 15 02/16/2016   ALT 17 02/16/2016   PROT 7.2 02/16/2016   ALBUMIN 4.0 02/16/2016   CALCIUM 9.5 02/16/2016   ANIONGAP 10 03/22/2015   GFR 89.09 02/16/2016   Lab Results  Component Value Date   CHOL 215* 02/16/2016   Lab Results  Component Value Date   HDL 41.00 02/16/2016   Lab Results  Component Value Date   LDLCALC 151* 02/16/2016   Lab Results  Component Value Date   TRIG 119.0 02/16/2016   Lab Results  Component Value Date   CHOLHDL 5 02/16/2016   Lab Results  Component Value Date   HGBA1C 7.8* 02/16/2016       Assessment & Plan:   Problem List Items Addressed This Visit    Hyperlipidemia, mixed   Relevant Orders   TSH   CBC   Hemoglobin A1c   Lipid panel   Comprehensive metabolic panel   Vitamin D (25 hydroxy)   GERD (gastroesophageal reflux disease)    Avoid offending foods, start probiotics. Do not eat large meals in late evening and consider raising head of bed.       Relevant Orders   TSH   CBC    Hemoglobin A1c   Lipid panel   Comprehensive metabolic panel   Vitamin D (25 hydroxy)   OSA (obstructive sleep apnea)    cpap is helping fatigue some although she still struggles with getting deep sleep. Encouraged good sleep hygiene such as dark, quiet room. No blue/green glowing lights such as computer screens in bedroom. No alcohol or stimulants in evening. Cut down on caffeine as able. Regular exercise is helpful but not just prior to bed time.       Back pain    Encouraged moist heat and gentle stretching as tolerated. May try NSAIDs and prescription meds as directed and report if symptoms worsen or seek immediate care. May continue muscle relaxers prn      Diabetes mellitus type 2 in obese ([facility])   Relevant Medications   metFORMIN (GLUCOPHAGE) 500 MG tablet   Other Relevant Orders   TSH   CBC   Hemoglobin A1c   Lipid panel   Comprehensive metabolic panel   Vitamin D (25 hydroxy)   Essential hypertension    Well controlled, no changes to meds. Encouraged heart healthy diet such as the DASH diet and exercise as tolerated.       Relevant Orders   TSH   CBC   Hemoglobin A1c   Lipid panel   Comprehensive metabolic panel   Vitamin D (25 hydroxy)   Eustachian tube dysfunction   Relevant Orders   TSH   CBC   Hemoglobin A1c   Lipid panel   Comprehensive metabolic panel   Vitamin D (25 hydroxy)   Morbid obesity ([facility])    Encouraged DASH diet, decrease po intake and increase exercise as tolerated. Needs 7-8 hours of sleep nightly.  Avoid trans fats, eat small, frequent meals every 4-5 hours with lean proteins, complex carbs and healthy fats. Minimize simple carbs. Will try Contrave      Relevant Medications   metFORMIN (GLUCOPHAGE) 500 MG tablet   Insomnia - Primary    Encouraged good sleep hygiene such as dark, quiet room. No blue/green glowing lights such as computer screens in bedroom. No alcohol or stimulants in evening. Cut down on caffeine as able. Regular exercise is  helpful but not just prior to bed time.       Relevant Orders   TSH   CBC   Hemoglobin A1c   Lipid panel   Comprehensive metabolic panel   Vitamin D (25 hydroxy)      I have discontinued Ms. Everly cephALEXin. I am also having her start on metFORMIN. Additionally, I am having her maintain her freestyle, glucose monitoring kit, metoprolol succinate, hydrochlorothiazide, methocarbamol, Vitamin D (Ergocalciferol), mometasone, loratadine, furosemide, and Naltrexone-Bupropion HCl ER.  Meds ordered this encounter  Medications  . metFORMIN (GLUCOPHAGE) 500 MG tablet    Sig: Take 1 tablet (500 mg total) by mouth 2 (two) times daily with a meal.    Dispense:  60 tablet    Refill:  3     Penni Homans, MD

## 2016-03-21 ENCOUNTER — Telehealth: Payer: Self-pay | Admitting: Family Medicine

## 2016-03-21 NOTE — Telephone Encounter (Signed)
So we could switch to Glimeperide but she might still urinate frequently because this is one way the body manages hi sugar. Metformin is definitely the best medication in terms of keeping her from having bad events like heart attacks and strokes in the future in fact we are even studying whether to put non diabetics on it to reduce risk of these events in the future. If she insists on switching let me know and I will advise on dosing.

## 2016-03-21 NOTE — Telephone Encounter (Signed)
°  Relationship to patient: Self Can be reached: [phone] Pharmacy:  Reason for call: Request to change diabetic medication. 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

## 2016-03-21 NOTE — Telephone Encounter (Signed)
Please advise 

## 2016-03-22 MED ORDER — METFORMIN HCL ER 500 MG PO TB24: 500.0000 mg | ORAL_TABLET | Freq: Two times a day (BID) | ORAL | Status: DC

## 2016-03-22 NOTE — Telephone Encounter (Signed)
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 She is having diarrhea/abdominal cramping and having to make many stops daily to the rest room making it difficult to work.

## 2016-03-22 NOTE — Telephone Encounter (Signed)
OK see if she is willing to try metformin XR 500 mg tabs 1 tab po bid, the XR might be better tolerated. Have her start with just once daily qhs to see if she tolerates it then if she does then she can increase to bid. Disp #60 with 1 rf

## 2016-03-22 NOTE — Telephone Encounter (Signed)
Called the patient left a message to call back. 

## 2016-03-22 NOTE — Telephone Encounter (Signed)
Patient contacted and did agree to try the XR. Sent to her local [facility]. Updated medication list.

## 2016-04-16 ENCOUNTER — Ambulatory Visit: Payer: BC Managed Care – PPO | Admitting: Pulmonary Disease

## 2016-04-18 ENCOUNTER — Ambulatory Visit: Payer: BC Managed Care – PPO | Admitting: Pulmonary Disease

## 2016-04-24 ENCOUNTER — Ambulatory Visit: Payer: BC Managed Care – PPO

## 2016-05-01 ENCOUNTER — Ambulatory Visit: Payer: BC Managed Care – PPO

## 2016-05-08 ENCOUNTER — Ambulatory Visit: Payer: BC Managed Care – PPO

## 2016-05-27 ENCOUNTER — Other Ambulatory Visit: Payer: Self-pay | Admitting: Family Medicine

## 2016-05-28 ENCOUNTER — Other Ambulatory Visit: Payer: Self-pay | Admitting: Family Medicine

## 2016-05-28 MED ORDER — METOPROLOL SUCCINATE ER 50 MG PO TB24: 50.0000 mg | ORAL_TABLET | Freq: Every day | ORAL | 0 refills | Status: DC

## 2016-05-30 ENCOUNTER — Other Ambulatory Visit (INDEPENDENT_AMBULATORY_CARE_PROVIDER_SITE_OTHER): Payer: BC Managed Care – PPO

## 2016-05-30 DIAGNOSIS — H6980 Other specified disorders of Eustachian tube, unspecified ear: Secondary | ICD-10-CM | POA: Diagnosis not present

## 2016-05-30 DIAGNOSIS — I10 Essential (primary) hypertension: Secondary | ICD-10-CM

## 2016-05-30 DIAGNOSIS — E119 Type 2 diabetes mellitus without complications: Secondary | ICD-10-CM

## 2016-05-30 DIAGNOSIS — E669 Obesity, unspecified: Secondary | ICD-10-CM

## 2016-05-30 DIAGNOSIS — E782 Mixed hyperlipidemia: Secondary | ICD-10-CM

## 2016-05-30 DIAGNOSIS — K219 Gastro-esophageal reflux disease without esophagitis: Secondary | ICD-10-CM | POA: Diagnosis not present

## 2016-05-30 DIAGNOSIS — G4700 Insomnia, unspecified: Secondary | ICD-10-CM

## 2016-05-30 DIAGNOSIS — E1169 Type 2 diabetes mellitus with other specified complication: Secondary | ICD-10-CM

## 2016-05-30 LAB — LIPID PANEL
CHOL/HDL RATIO: 5
Cholesterol: 193 mg/dL (ref 0–200)
HDL: 38.1 mg/dL — ABNORMAL LOW (ref >39.00)
LDL Cholesterol: 120 mg/dL — ABNORMAL HIGH (ref 0–99)
NONHDL: 155.24
Triglycerides: 177 mg/dL — ABNORMAL HIGH (ref 0.0–149.0)
VLDL: 35.4 mg/dL (ref 0.0–40.0)

## 2016-05-30 LAB — COMPREHENSIVE METABOLIC PANEL
ALK PHOS: 48 U/L (ref 39–117)
ALT: 20 U/L (ref 0–35)
AST: 15 U/L (ref 0–37)
Albumin: 4 g/dL (ref 3.5–5.2)
BILIRUBIN TOTAL: 0.3 mg/dL (ref 0.2–1.2)
BUN: 11 mg/dL (ref 6–23)
CO2: 26 meq/L (ref 19–32)
Calcium: 8.9 mg/dL (ref 8.4–10.5)
Chloride: 106 mEq/L (ref 96–112)
Creatinine, Ser: 0.95 mg/dL (ref 0.40–1.20)
GFR: 81.46 mL/min (ref >60.00)
GLUCOSE: 131 mg/dL — AB (ref 70–99)
POTASSIUM: 4 meq/L (ref 3.5–5.1)
SODIUM: 138 meq/L (ref 135–145)
TOTAL PROTEIN: 7.1 g/dL (ref 6.0–8.3)

## 2016-05-30 LAB — CBC
HEMATOCRIT: 39.4 % (ref 36.0–46.0)
HEMOGLOBIN: 13.4 g/dL (ref 12.0–15.0)
MCHC: 34 g/dL (ref 30.0–36.0)
MCV: 91.2 fl (ref 78.0–100.0)
Platelets: 357 10*3/uL (ref 150.0–400.0)
RBC: 4.32 Mil/uL (ref 3.87–5.11)
RDW: 14.1 % (ref 11.5–15.5)
WBC: 6.8 10*3/uL (ref 4.0–10.5)

## 2016-05-30 LAB — TSH: TSH: 1.95 u[IU]/mL (ref 0.35–4.50)

## 2016-05-30 LAB — VITAMIN D 25 HYDROXY (VIT D DEFICIENCY, FRACTURES): VITD: 35.65 ng/mL (ref 30.00–100.00)

## 2016-05-30 LAB — HEMOGLOBIN A1C: HEMOGLOBIN A1C: 7.2 % — AB (ref 4.6–6.5)

## 2016-05-31 ENCOUNTER — Other Ambulatory Visit: Payer: BC Managed Care – PPO

## 2016-06-04 ENCOUNTER — Ambulatory Visit: Payer: BC Managed Care – PPO | Admitting: Family Medicine

## 2016-06-05 ENCOUNTER — Telehealth: Payer: Self-pay | Admitting: Family Medicine

## 2016-06-26 ENCOUNTER — Ambulatory Visit: Payer: BC Managed Care – PPO | Admitting: Pulmonary Disease

## 2016-06-27 ENCOUNTER — Encounter: Payer: Self-pay | Admitting: Adult Health

## 2016-06-27 ENCOUNTER — Ambulatory Visit (INDEPENDENT_AMBULATORY_CARE_PROVIDER_SITE_OTHER): Payer: BC Managed Care – PPO | Admitting: Adult Health

## 2016-06-27 VITALS — BP 140/86 | HR 71 | Temp 97.9°F | Ht 65.0 in | Wt 239.0 lb

## 2016-06-27 DIAGNOSIS — G4733 Obstructive sleep apnea (adult) (pediatric): Secondary | ICD-10-CM | POA: Diagnosis not present

## 2016-06-27 DIAGNOSIS — Z23 Encounter for immunization: Secondary | ICD-10-CM

## 2016-06-27 NOTE — Progress Notes (Signed)
Subjective:    Patient ID: Chelsea Bray, female    DOB: 1970/12/17, 45 y.o.   MRN: [id number]  HPI 45 yo female followed for severe OSA on CPAP .   TEST  HST 02/06/16 >> AHI 30.4, SaO2 low 84%.   06/27/2016 Follow up : OSA  Pt returns for 4 month follow up . Seen last ov for sleep consult . Has been [facility] 2008 on same CPAP. She had a Home sleep study in May that showed severe OSA. She was continued on CPAP and placed on autoset 5-15cm. Says she feels pressures may be too much at times, feels machine is getting old and not working as well as in past. She wants new machine. Would like to try a mini CPAP as she travels a lot .  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 Gets compliance reports.  We discussed download today to check her control .  She feels rested, denies sigificant daytime sleepiness We discussed CPAP , OSA  Compliance , healthy sleep regimen and driving issues (not to drive if sleepy) .  Download in May showed execellent compliance with good control 1.3.   Past Medical History:  Diagnosis Date  . Acid reflux disease   . Diabetes ([place]) 11/17/2011  . Diabetes mellitus type 2 in obese ([place]) 11/17/2011  . Hyperlipidemia   . Hypertension   . Insomnia 03/08/2016  . Morbid obesity ([place]) 01/10/2013  . Pain in joint, upper arm 11/08/2015  . Preventative health care 11/29/2013  . TOS (thoracic outlet syndrome) 11/20/2015  . Vaginitis and vulvovaginitis 12/23/2014   Current Outpatient Prescriptions on File Prior to Visit  Medication Sig Dispense Refill  . glucose monitoring kit (FREESTYLE) monitoring kit 1 each by Does not apply route 3 (three) times a week. Or any monitor that insurance will cover  DX:790.29 1 each 0  . Lancets (FREESTYLE) lancets DX: 790.29 100 each 0  . metFORMIN (GLUCOPHAGE-XR) 500 MG 24 hr tablet TAKE ONE TABLET BY MOUTH TWICE DAILY 60 tablet 0  . metoprolol succinate (TOPROL-XL) 50 MG 24  hr tablet Take 1 tablet (50 mg total) by mouth daily. 30 tablet 0  . Naltrexone-Bupropion HCl ER (CONTRAVE) 8-90 MG TB12 Taper up to 2 tablets twice a day.  Start with one tablet and add one tablet each week 120 tablet 3  . Vitamin D, Ergocalciferol, (DRISDOL) 50000 units CAPS capsule TAKE 1 CAPSULE BY MOUTH WEEKLY 12 capsule 0  . furosemide (LASIX) 20 MG tablet Take 1 tablet (20 mg total) by mouth daily as needed. (Patient not taking: Reported on 06/27/2016) 30 tablet 2  . hydrochlorothiazide (HYDRODIURIL) 25 MG tablet Take 25 mg by mouth daily.    Marland Kitchen loratadine (CLARITIN) 10 MG tablet Take 1 tablet (10 mg total) by mouth daily. (Patient not taking: Reported on 06/27/2016) 30 tablet 6  . methocarbamol (ROBAXIN) 500 MG tablet Take 1 tablet (500 mg total) by mouth at bedtime as needed for muscle spasms. (Patient not taking: Reported on 06/27/2016) 30 tablet 0  . mometasone (NASONEX) 50 MCG/ACT nasal spray Place 2 sprays into the nose daily. (Patient not taking: Reported on 06/27/2016) 17 g 6   No current facility-administered medications on file prior to visit.      Review of Systems Constitutional:   No  weight loss, night sweats,  Fevers, chills, fatigue, or  lassitude.  HEENT:   No headaches,  Difficulty swallowing,  Tooth/dental problems, or  Sore throat,                No sneezing, itching, ear ache, nasal congestion, post nasal drip,   CV:  No chest pain,  Orthopnea, PND, swelling in lower extremities, anasarca, dizziness, palpitations, syncope.   GI  No heartburn, indigestion, abdominal pain, nausea, vomiting, diarrhea, change in bowel habits, loss of appetite, bloody stools.   Resp: No shortness of breath with exertion or at rest.  No excess mucus, no productive cough,  No non-productive cough,  No coughing up of blood.  No change in color of mucus.  No wheezing.  No chest wall deformity  Skin: no rash or lesions.  GU: no dysuria, change in color of urine, no urgency or frequency.   No flank pain, no hematuria   MS:  No joint pain or swelling.  No decreased range of motion.  No back pain.  Psych:  No change in mood or affect. No depression or anxiety.  No memory loss.         Objective:   Physical Exam Vitals:   06/27/16 0921  BP: 140/86  Pulse: 71  Temp: 97.9 F (36.6 C)  TempSrc: Oral  SpO2: 98%  Weight: 239 lb (108.4 kg)  Height: '5\' 5"'$  (1.651 m)  Body mass index is 39.77 kg/m.   GEN: A/Ox3; pleasant , NAD, well nourished    HEENT:  [place]/AT,  EACs-clear, TMs-wnl, NOSE-clear, THROAT-clear, no lesions, no postnasal drip or exudate noted. Class 2-3 MP airway   NECK:  Supple w/ fair ROM; no JVD; normal carotid impulses w/o bruits; no thyromegaly or nodules palpated; no lymphadenopathy.    RESP  Clear  P & A; w/o, wheezes/ rales/ or rhonchi. no accessory muscle use, no dullness to percussion  CARD:  RRR, no m/r/g  , no peripheral edema, pulses intact, no cyanosis or clubbing.  GI:   Soft & nt; nml bowel sounds; no organomegaly or masses detected.   Musco: Warm bil, no deformities or joint swelling noted.   Neuro: alert, no focal deficits noted.    Skin: Warm, no lesions or rashes  Tammy Parrett NP-C  [facility]  06/27/2016        Assessment & Plan:

## 2016-06-27 NOTE — Progress Notes (Signed)
Reviewed and agree with assessment/plan.  Coralyn HellingVineet Skylin Kennerson, MD [facility] 06/27/2016, 10:57 AM Pager:  [phone]

## 2016-06-27 NOTE — Assessment & Plan Note (Signed)
Controlled on CPAP  Order for new CPAP , mini (she travels a lot )   Plan  Patient Instructions  Continue on CPAP At bedtime.  CPAP download.  Order to DME for new CPAP machine -Mini CPAP .  Do not drive if sleepy  Remain active and work on weight loss.  Follow up with Dr. Craige CottaSood  In 6 months and As needed

## 2016-06-27 NOTE — Patient Instructions (Signed)
Continue on CPAP At bedtime.  CPAP download.  Order to DME for new CPAP machine -Mini CPAP .  Do not drive if sleepy  Remain active and work on weight loss.  Follow up with Dr. Craige CottaSood  In 6 months and As needed

## 2016-07-12 ENCOUNTER — Other Ambulatory Visit: Payer: Self-pay | Admitting: Family Medicine

## 2016-07-19 ENCOUNTER — Telehealth: Payer: Self-pay | Admitting: Family Medicine

## 2016-07-19 MED ORDER — METOPROLOL SUCCINATE ER 50 MG PO TB24
50.0000 mg | ORAL_TABLET | Freq: Every day | ORAL | 1 refills | Status: DC
Start: 2016-07-19 — End: 2016-11-15

## 2016-07-19 MED ORDER — HYDROCHLOROTHIAZIDE 25 MG PO TABS: 25.0000 mg | ORAL_TABLET | Freq: Every day | ORAL | 1 refills | Status: DC

## 2016-07-19 NOTE — Telephone Encounter (Signed)
Refill done for 90 days and pt informed

## 2016-07-19 NOTE — Telephone Encounter (Signed)
Self. Refill request for hydrochlorothiazide, metoprolol succinate   Pt would like to know if she can have refills on file or have a bigger quantity? She says that she feels that she is having to call in to often to request.    [facility] [address] [place], [place] - [id number]    CB: [phone]

## 2016-08-14 ENCOUNTER — Ambulatory Visit: Payer: BC Managed Care – PPO | Admitting: Medical

## 2016-08-23 NOTE — Telephone Encounter (Signed)
b

## 2016-08-24 ENCOUNTER — Other Ambulatory Visit: Payer: Self-pay | Admitting: Family Medicine

## 2016-11-15 ENCOUNTER — Telehealth: Payer: Self-pay | Admitting: Family Medicine

## 2016-11-15 MED ORDER — METOPROLOL SUCCINATE ER 50 MG PO TB24: 50.0000 mg | ORAL_TABLET | Freq: Every day | ORAL | 1 refills | Status: DC

## 2016-11-15 MED ORDER — VITAMIN D (ERGOCALCIFEROL) 1.25 MG (50000 UNIT) PO CAPS: ORAL_CAPSULE | ORAL | 0 refills | Status: DC

## 2016-11-15 NOTE — Telephone Encounter (Signed)
Sent in and patient is aware °

## 2016-11-15 NOTE — Telephone Encounter (Signed)
Self.   Refill request for Metoprolol and vitamin D    Pharmacy: [facility] [address] - [place], [place] - [id number]     Pt says that she will be going out of town tomorrow and would like to know if PCP could send in today if possible?

## 2017-02-21 ENCOUNTER — Ambulatory Visit (INDEPENDENT_AMBULATORY_CARE_PROVIDER_SITE_OTHER): Payer: BC Managed Care – PPO | Admitting: Family Medicine

## 2017-02-21 ENCOUNTER — Encounter: Payer: Self-pay | Admitting: Family Medicine

## 2017-02-21 VITALS — BP 107/64 | HR 96 | Temp 98.6°F | Ht 65.0 in | Wt 240.4 lb

## 2017-02-21 DIAGNOSIS — K635 Polyp of colon: Secondary | ICD-10-CM

## 2017-02-21 DIAGNOSIS — R0789 Other chest pain: Secondary | ICD-10-CM

## 2017-02-21 DIAGNOSIS — Z1231 Encounter for screening mammogram for malignant neoplasm of breast: Secondary | ICD-10-CM | POA: Diagnosis not present

## 2017-02-21 DIAGNOSIS — E1169 Type 2 diabetes mellitus with other specified complication: Secondary | ICD-10-CM

## 2017-02-21 DIAGNOSIS — Z1239 Encounter for other screening for malignant neoplasm of breast: Secondary | ICD-10-CM

## 2017-02-21 DIAGNOSIS — K219 Gastro-esophageal reflux disease without esophagitis: Secondary | ICD-10-CM | POA: Diagnosis not present

## 2017-02-21 DIAGNOSIS — E782 Mixed hyperlipidemia: Secondary | ICD-10-CM

## 2017-02-21 DIAGNOSIS — E785 Hyperlipidemia, unspecified: Secondary | ICD-10-CM | POA: Diagnosis not present

## 2017-02-21 DIAGNOSIS — E559 Vitamin D deficiency, unspecified: Secondary | ICD-10-CM

## 2017-02-21 DIAGNOSIS — Z0000 Encounter for general adult medical examination without abnormal findings: Secondary | ICD-10-CM

## 2017-02-21 DIAGNOSIS — E669 Obesity, unspecified: Secondary | ICD-10-CM

## 2017-02-21 DIAGNOSIS — I10 Essential (primary) hypertension: Secondary | ICD-10-CM

## 2017-02-21 DIAGNOSIS — R002 Palpitations: Secondary | ICD-10-CM

## 2017-02-21 DIAGNOSIS — Z8719 Personal history of other diseases of the digestive system: Secondary | ICD-10-CM | POA: Insufficient documentation

## 2017-02-21 HISTORY — DX: Polyp of colon: K63.5

## 2017-02-21 HISTORY — DX: Personal history of other diseases of the digestive system: Z87.19

## 2017-02-21 MED ORDER — HYOSCYAMINE SULFATE 0.125 MG SL SUBL: 0.1250 mg | SUBLINGUAL_TABLET | SUBLINGUAL | 1 refills | Status: DC | PRN

## 2017-02-21 MED ORDER — HYDROCHLOROTHIAZIDE 25 MG PO TABS: 25.0000 mg | ORAL_TABLET | Freq: Every day | ORAL | 1 refills | Status: DC

## 2017-02-21 MED ORDER — VITAMIN D (ERGOCALCIFEROL) 1.25 MG (50000 UNIT) PO CAPS: ORAL_CAPSULE | ORAL | Status: DC

## 2017-02-21 MED ORDER — ZOLPIDEM TARTRATE 10 MG PO TABS: 10.0000 mg | ORAL_TABLET | Freq: Every evening | ORAL | 3 refills | Status: DC | PRN

## 2017-02-21 NOTE — Assessment & Plan Note (Signed)
Encouraged heart healthy diet, increase exercise, avoid trans fats, consider a krill oil cap daily 

## 2017-02-21 NOTE — Assessment & Plan Note (Signed)
Per patient has required dilatation in past, asymptomatic at this time. [place] from [place] and refer to [facility]

## 2017-02-21 NOTE — Assessment & Plan Note (Signed)
Patient encouraged to maintain heart healthy diet, regular exercise, adequate sleep. Consider daily probiotics. Take medications as prescribed. Given and reviewed copy of ACP documents from [place] Secretary of State and encouraged to complete and return 

## 2017-02-21 NOTE — Assessment & Plan Note (Signed)
[facility]

## 2017-02-21 NOTE — Assessment & Plan Note (Signed)
Check level today, continue supplements 

## 2017-02-21 NOTE — Assessment & Plan Note (Addendum)
minimize simple carbs. Increase exercise as tolerated. Continue current meds  

## 2017-02-21 NOTE — Progress Notes (Signed)
Pre visit review using our clinic review tool, if applicable. No additional management support is needed unless otherwise documented below in the visit note. 

## 2017-02-21 NOTE — Patient Instructions (Addendum)
[email or web address] NOW company probiotics 1 cap daily   Preventive Care 40-64 Years, Female Preventive care refers to lifestyle choices and visits with your health care provider that can promote health and wellness. What does preventive care include?  A yearly physical exam. This is also called an annual well check.  Dental exams once or twice a year.  Routine eye exams. Ask your health care provider how often you should have your eyes checked.  Personal lifestyle choices, including: ? Daily care of your teeth and gums. ? Regular physical activity. ? Eating a healthy diet. ? Avoiding tobacco and drug use. ? Limiting alcohol use. ? Practicing safe sex. ? Taking low-dose aspirin daily starting at age 83. ? Taking vitamin and mineral supplements as recommended by your health care provider. What happens during an annual well check? The services and screenings done by your health care provider during your annual well check will depend on your age, overall health, lifestyle risk factors, and family history of disease. Counseling Your health care provider may ask you questions about your:  Alcohol use.  Tobacco use.  Drug use.  Emotional well-being.  Home and relationship well-being.  Sexual activity.  Eating habits.  Work and work Statistician.  Method of birth control.  Menstrual cycle.  Pregnancy history.  Screening You may have the following tests or measurements:  Height, weight, and BMI.  Blood pressure.  Lipid and cholesterol levels. These may be checked every 5 years, or more frequently if you are over 23 years old.  Skin check.  Lung cancer screening. You may have this screening every year starting at age 78 if you have a 30-pack-year history of smoking and currently smoke or have quit within the past 15 years.  Fecal occult blood test (FOBT) of the stool. You may have this test every year starting at age 68.  Flexible sigmoidoscopy or colonoscopy. You  may have a sigmoidoscopy every 5 years or a colonoscopy every 10 years starting at age 62.  Hepatitis C blood test.  Hepatitis B blood test.  Sexually transmitted disease (STD) testing.  Diabetes screening. This is done by checking your blood sugar (glucose) after you have not eaten for a while (fasting). You may have this done every 1-3 years.  Mammogram. This may be done every 1-2 years. Talk to your health care provider about when you should start having regular mammograms. This may depend on whether you have a family history of breast cancer.  BRCA-related cancer screening. This may be done if you have a family history of breast, ovarian, tubal, or peritoneal cancers.  Pelvic exam and Pap test. This may be done every 3 years starting at age 28. Starting at age 29, this may be done every 5 years if you have a Pap test in combination with an HPV test.  Bone density scan. This is done to screen for osteoporosis. You may have this scan if you are at high risk for osteoporosis.  Discuss your test results, treatment options, and if necessary, the need for more tests with your health care provider. Vaccines Your health care provider may recommend certain vaccines, such as:  Influenza vaccine. This is recommended every year.  Tetanus, diphtheria, and acellular pertussis (Tdap, Td) vaccine. You may need a Td booster every 10 years.  Varicella vaccine. You may need this if you have not been vaccinated.  Zoster vaccine. You may need this after age 41.  Measles, mumps, and rubella (MMR) vaccine. You may need at least  one dose of MMR if you were born in 1957 or later. You may also need a second dose.  Pneumococcal 13-valent conjugate (PCV13) vaccine. You may need this if you have certain conditions and were not previously vaccinated.  Pneumococcal polysaccharide (PPSV23) vaccine. You may need one or two doses if you smoke cigarettes or if you have certain conditions.  Meningococcal  vaccine. You may need this if you have certain conditions.  Hepatitis A vaccine. You may need this if you have certain conditions or if you travel or work in places where you may be exposed to hepatitis A.  Hepatitis B vaccine. You may need this if you have certain conditions or if you travel or work in places where you may be exposed to hepatitis B.  Haemophilus influenzae type b (Hib) vaccine. You may need this if you have certain conditions.  Talk to your health care provider about which screenings and vaccines you need and how often you need them. This information is not intended to replace advice given to you by your health care provider. Make sure you discuss any questions you have with your health care provider. Document Released: 09/30/2015 Document Revised: 05/23/2016 Document Reviewed: 07/05/2015 Elsevier Interactive Patient Education  2017 Elsevier Inc.  

## 2017-02-21 NOTE — Assessment & Plan Note (Signed)
With some separate DOE. Proceed with echo

## 2017-02-21 NOTE — Assessment & Plan Note (Signed)
Well controlled, no changes to meds. Encouraged heart healthy diet such as the DASH diet and exercise as tolerated.  °

## 2017-02-21 NOTE — Progress Notes (Signed)
Patient ID: Chelsea Bray, female   DOB: 1970/11/24, 46 y.o.   MRN: [id number]   Subjective:  I acted as a Education administrator for Chelsea Bray, [place], [place]   Patient ID: Chelsea Bray, female    DOB: 05/21/1971, 46 y.o.   MRN: [id number]  Chief Complaint  Patient presents with  . Annual Exam  . Hypertension  . Gas  . Palpitations    Hypertension  This is a chronic problem. The problem is controlled. Associated symptoms include chest pain and palpitations. Pertinent negatives include no blurred vision, headaches, malaise/fatigue or shortness of breath.  Palpitations   This is a new problem. The current episode started more than 1 month ago. The problem occurs intermittently. The problem has been unchanged. On average, each episode lasts 3 minutes. The symptoms are aggravated by unknown. Associated symptoms include chest pain. Pertinent negatives include no coughing, fever, malaise/fatigue, shortness of breath or vomiting. The treatment provided no relief. Risk factors include obesity and diabetes mellitus.    Patient is in today for an annual examination. Patient states for the past two months, she has experienced some palpitations with or without exertion, lasting 2-3 minutes. Patient states wants to discuss her Metformin medication. States that for the past 1.5 months, she experiences cramping; which leads her to have to use the restroom. Also states that she has been experiencing gas for the past 1.5 months. Patient has a Hx of HTN, Type II Diabetes, GERD, knee pain. Patient has no additional acute concerns noted at this time. No polyuria or polydipsia. Denies SOB/HA/congestion/fevers/GI or GU c/o. Taking meds as prescribed  Patient Care Team: Mosie Lukes, MD as PCP - General (Family Medicine)   Past Medical History:  Diagnosis Date  . Acid reflux disease   . Colon polyps 02/21/2017  . Diabetes ([place]) 11/17/2011  . Diabetes mellitus type 2 in obese ([place]) 11/17/2011  . History of  esophageal stricture 02/21/2017  . History of esophageal stricture 02/21/2017  . Hyperlipidemia   . Hypertension   . Insomnia 03/08/2016  . Morbid obesity ([place]) 01/10/2013  . Pain in joint, upper arm 11/08/2015  . Preventative health care 11/29/2013  . TOS (thoracic outlet syndrome) 11/20/2015  . Vaginitis and vulvovaginitis 12/23/2014    Past Surgical History:  Procedure Laterality Date  . BREAST REDUCTION SURGERY  2001  . CEREBRAL MICROVASCULAR DECOMPRESSION  2006   back of her head  . CHOLECYSTECTOMY    . GALLBLADDER SURGERY  1991   gall stone removed  . MOLE REMOVAL      Family History  Problem Relation Age of Onset  . Heart disease Maternal Grandmother        MI  . Stroke Maternal Grandmother   . Diabetes Maternal Grandmother   . Arrhythmia Mother   . Diabetes Mother        maternal grandmother and aunts  . Hypertension Mother   . Heart attack Father   . Stroke Father   . Hyperlipidemia Father   . Hypertension Father   . Cancer Father        lung, smoker  . Heart disease Father        CABG x 4  . Prostate cancer Maternal Grandfather   . Cancer Maternal Grandfather        colon cancer, prostate  . Alcohol abuse Paternal Grandmother        died of pneumonia  . Heart disease Paternal Grandfather        MI  .  Asthma Daughter        allergies  . Hypertension Unknown        parents and maternal parents  . Breast cancer Neg Hx     Social History   Social History  . Marital status: Married    Spouse name: N/A  . Number of children: N/A  . Years of education: N/A   Occupational History  . Bus Driver    Social History Main Topics  . Smoking status: Never Smoker  . Smokeless tobacco: Never Used  . Alcohol use 0.0 oz/week     Comment: wine ocassion  . Drug use: No  . Sexual activity: Yes    Birth control/ protection: None     Comment: avoid dairy, lives with husband, daughters. works at school bus driver   Other Topics Concern  . Not on file   Social  History Narrative  . No narrative on file    Outpatient Medications Prior to Visit  Medication Sig Dispense Refill  . glucose monitoring kit (FREESTYLE) monitoring kit 1 each by Does not apply route 3 (three) times a week. Or any monitor that insurance will cover  DX:790.29 1 each 0  . Lancets (FREESTYLE) lancets DX: 790.29 100 each 0  . metFORMIN (GLUCOPHAGE-XR) 500 MG 24 hr tablet TAKE ONE TABLET BY MOUTH TWICE DAILY 60 tablet 0  . metoprolol succinate (TOPROL-XL) 50 MG 24 hr tablet Take 1 tablet (50 mg total) by mouth daily. Take with or immediately following a meal. 90 tablet 1  . Naltrexone-Bupropion HCl ER (CONTRAVE) 8-90 MG TB12 Taper up to 2 tablets twice a day.  Start with one tablet and add one tablet each week 120 tablet 3  . hydrochlorothiazide (HYDRODIURIL) 25 MG tablet Take 1 tablet (25 mg total) by mouth daily. 90 tablet 1  . Vitamin D, Ergocalciferol, (DRISDOL) 50000 units CAPS capsule TAKE 1 CAPSULE BY MOUTH WEEKLY 12 capsule 0  . furosemide (LASIX) 20 MG tablet Take 1 tablet (20 mg total) by mouth daily as needed. (Patient not taking: Reported on 02/21/2017) 30 tablet 2  . loratadine (CLARITIN) 10 MG tablet Take 1 tablet (10 mg total) by mouth daily. (Patient not taking: Reported on 02/21/2017) 30 tablet 6  . methocarbamol (ROBAXIN) 500 MG tablet Take 1 tablet (500 mg total) by mouth at bedtime as needed for muscle spasms. (Patient not taking: Reported on 02/21/2017) 30 tablet 0  . mometasone (NASONEX) 50 MCG/ACT nasal spray Place 2 sprays into the nose daily. (Patient not taking: Reported on 02/21/2017) 17 g 6   No facility-administered medications prior to visit.     No Known Allergies  Review of Systems  Constitutional: Negative for fever and malaise/fatigue.  HENT: Negative for congestion.   Eyes: Negative for blurred vision.  Respiratory: Negative for cough and shortness of breath.   Cardiovascular: Positive for chest pain and palpitations. Negative for leg swelling.        With or without exertion.   Gastrointestinal: Negative for vomiting.       Gas. Cramps due to diabetes medication.  Musculoskeletal: Negative for back pain.  Skin: Negative for rash.  Neurological: Negative for loss of consciousness and headaches.       Objective:    Physical Exam  Constitutional: She is oriented to person, place, and time. She appears well-developed and well-nourished. No distress.  HENT:  Head: Normocephalic and atraumatic.  Eyes: Conjunctivae are normal.  Neck: Normal range of motion. No thyromegaly present.  Cardiovascular: Normal  rate and regular rhythm.   Pulmonary/Chest: Effort normal and breath sounds normal. She has no wheezes.  Abdominal: Soft. Bowel sounds are normal. There is no tenderness.  Musculoskeletal: She exhibits no edema or deformity.  Neurological: She is alert and oriented to person, place, and time.  Skin: Skin is warm and dry. She is not diaphoretic.  Psychiatric: She has a normal mood and affect.    BP 107/64 (BP Location: Left Wrist, Patient Position: Sitting, Cuff Size: Normal)   Pulse 96   Temp 98.6 F (37 C) (Oral)   Ht '5\' 5"'$  (1.651 m)   Wt 240 lb 6.4 oz (109 kg)   SpO2 100% Comment: RA  BMI 40.00 kg/m  Wt Readings from Last 3 Encounters:  02/21/17 240 lb 6.4 oz (109 kg)  06/27/16 239 lb (108.4 kg)  03/08/16 237 lb 4 oz (107.6 kg)   BP Readings from Last 3 Encounters:  02/21/17 107/64  06/27/16 140/86  03/08/16 118/86     Immunization History  Administered Date(s) Administered  . Influenza Split 06/13/2012  . Influenza,inj,Quad PF,36+ Mos 06/27/2016  . Pneumococcal Conjugate-13 01/12/2016  . Tdap 02/28/2004, 11/23/2013    Health Maintenance  Topic Date Due  . OPHTHALMOLOGY EXAM  06/10/1980  . PAP SMEAR  03/11/2016  . FOOT EXAM  01/11/2017  . URINE MICROALBUMIN  02/15/2017  . PNEUMOCOCCAL POLYSACCHARIDE VACCINE (1) 05/24/2017 (Originally 06/10/1972)  . INFLUENZA VACCINE  04/17/2017  . HEMOGLOBIN A1C   08/23/2017  . TETANUS/TDAP  11/24/2023  . HIV Screening  Completed    Lab Results  Component Value Date   WBC 9.2 02/21/2017   HGB 13.8 02/21/2017   HCT 41.5 02/21/2017   PLT 399.0 02/21/2017   GLUCOSE 171 (H) 02/21/2017   CHOL 217 (H) 02/21/2017   TRIG 229.0 (H) 02/21/2017   HDL 40.10 02/21/2017   LDLDIRECT 149.0 02/21/2017   LDLCALC 120 (H) 05/30/2016   ALT 23 02/21/2017   AST 18 02/21/2017   NA 138 02/21/2017   K 4.3 02/21/2017   CL 102 02/21/2017   CREATININE 1.07 02/21/2017   BUN 18 02/21/2017   CO2 29 02/21/2017   TSH 1.15 02/21/2017   HGBA1C 8.1 (H) 02/21/2017   MICROALBUR 1.4 02/16/2016    Lab Results  Component Value Date   TSH 1.15 02/21/2017   Lab Results  Component Value Date   WBC 9.2 02/21/2017   HGB 13.8 02/21/2017   HCT 41.5 02/21/2017   MCV 94.1 02/21/2017   PLT 399.0 02/21/2017   Lab Results  Component Value Date   NA 138 02/21/2017   K 4.3 02/21/2017   CO2 29 02/21/2017   GLUCOSE 171 (H) 02/21/2017   BUN 18 02/21/2017   CREATININE 1.07 02/21/2017   BILITOT 0.2 02/21/2017   ALKPHOS 44 02/21/2017   AST 18 02/21/2017   ALT 23 02/21/2017   PROT 7.3 02/21/2017   ALBUMIN 4.1 02/21/2017   CALCIUM 9.8 02/21/2017   ANIONGAP 10 03/22/2015   GFR 70.78 02/21/2017   Lab Results  Component Value Date   CHOL 217 (H) 02/21/2017   Lab Results  Component Value Date   HDL 40.10 02/21/2017   Lab Results  Component Value Date   LDLCALC 120 (H) 05/30/2016   Lab Results  Component Value Date   TRIG 229.0 (H) 02/21/2017   Lab Results  Component Value Date   CHOLHDL 5 02/21/2017   Lab Results  Component Value Date   HGBA1C 8.1 (H) 02/21/2017  Assessment & Plan:   Problem List Items Addressed This Visit    Hyperlipidemia, mixed    Encouraged heart healthy diet, increase exercise, avoid trans fats, consider a krill oil cap daily      Relevant Medications   hydrochlorothiazide (HYDRODIURIL) 25 MG tablet   GERD  (gastroesophageal reflux disease)    Has also noted some intermittent loose stool and denies bloody or tarry stool.       Relevant Medications   hyoscyamine (LEVSIN SL) 0.125 MG SL tablet   Diabetes mellitus type 2 in obese ([facility])    minimize simple carbs. Increase exercise as tolerated. Continue current meds      Relevant Orders   Hemoglobin A1c   Comprehensive metabolic panel   Hemoglobin A1c (Completed)   Essential hypertension    Well controlled, no changes to meds. Encouraged heart healthy diet such as the DASH diet and exercise as tolerated.       Relevant Medications   hydrochlorothiazide (HYDRODIURIL) 25 MG tablet   Other Relevant Orders   CBC   Comprehensive metabolic panel   TSH   Comprehensive metabolic panel (Completed)   CBC (Completed)   TSH (Completed)   Morbid obesity ([place])    Encouraged DASH diet, decrease po intake and increase exercise as tolerated. Needs 7-8 hours of sleep nightly. Avoid trans fats, eat small, frequent meals every 4-5 hours with lean proteins, complex carbs and healthy fats. Minimize simple carbs, bariatric referral       Atypical chest pain    With palpitations Echo ordered today      Relevant Orders   VITAMIN D 25 Hydroxy (Vit-D Deficiency, Fractures) (Completed)   Comprehensive metabolic panel   ECHOCARDIOGRAM COMPLETE   Vitamin D deficiency    Check level today, continue supplements      Preventative health care - Primary    Patient encouraged to maintain heart healthy diet, regular exercise, adequate sleep. Consider daily probiotics. Take medications as prescribed. Given and reviewed copy of ACP documents from [employer] and encouraged to complete and return.      Relevant Orders   Comprehensive metabolic panel   Palpitations    With some separate DOE. Proceed with echo      Relevant Orders   ECHOCARDIOGRAM COMPLETE   Colon polyps    Referred to [facility]      Relevant Orders   Ambulatory  referral to Gastroenterology   History of esophageal stricture    Per patient has required dilatation in past, asymptomatic at this time. Request records from [place] and refer to [facility]      Relevant Orders   Ambulatory referral to Gastroenterology    Other Visit Diagnoses    Hyperlipidemia, unspecified hyperlipidemia type       Relevant Medications   hydrochlorothiazide (HYDRODIURIL) 25 MG tablet   Other Relevant Orders   Lipid panel   Lipid panel (Completed)   Breast cancer screening       Relevant Orders   MM DIGITAL SCREENING BILATERAL      I have discontinued Ms. Asberry methocarbamol, mometasone, loratadine, and furosemide. I am also having her start on hyoscyamine and zolpidem. Additionally, I am having her maintain her freestyle, glucose monitoring kit, Naltrexone-Bupropion HCl ER, metFORMIN, metoprolol succinate, Vitamin D (Ergocalciferol), and hydrochlorothiazide.  Meds ordered this encounter  Medications  . Vitamin D, Ergocalciferol, (DRISDOL) 50000 units CAPS capsule    Sig: TAKE 1 CAPSULE BY MOUTH WEEKLY    Dispense:  12 capsule  Refill:  01    **Patient requests 90 days supply**  . hydrochlorothiazide (HYDRODIURIL) 25 MG tablet    Sig: Take 1 tablet (25 mg total) by mouth daily.    Dispense:  90 tablet    Refill:  1  . hyoscyamine (LEVSIN SL) 0.125 MG SL tablet    Sig: Place 1 tablet (0.125 mg total) under the tongue every 4 (four) hours as needed.    Dispense:  30 tablet    Refill:  1  . zolpidem (AMBIEN) 10 MG tablet    Sig: Take 1 tablet (10 mg total) by mouth at bedtime as needed for sleep.    Dispense:  30 tablet    Refill:  3    .team Chelsea Homans, MD

## 2017-02-22 ENCOUNTER — Encounter: Payer: Self-pay | Admitting: Family Medicine

## 2017-02-22 LAB — LIPID PANEL
CHOL/HDL RATIO: 5
Cholesterol: 217 mg/dL — ABNORMAL HIGH (ref 0–200)
HDL: 40.1 mg/dL (ref >39.00)
NONHDL: 176.92
TRIGLYCERIDES: 229 mg/dL — AB (ref 0.0–149.0)
VLDL: 45.8 mg/dL — ABNORMAL HIGH (ref 0.0–40.0)

## 2017-02-22 LAB — CBC
HEMATOCRIT: 41.5 % (ref 36.0–46.0)
HEMOGLOBIN: 13.8 g/dL (ref 12.0–15.0)
MCHC: 33.3 g/dL (ref 30.0–36.0)
MCV: 94.1 fl (ref 78.0–100.0)
PLATELETS: 399 10*3/uL (ref 150.0–400.0)
RBC: 4.41 Mil/uL (ref 3.87–5.11)
RDW: 14.4 % (ref 11.5–15.5)
WBC: 9.2 10*3/uL (ref 4.0–10.5)

## 2017-02-22 LAB — HEMOGLOBIN A1C: Hgb A1c MFr Bld: 8.1 % — ABNORMAL HIGH (ref 4.6–6.5)

## 2017-02-22 LAB — COMPREHENSIVE METABOLIC PANEL
ALT: 23 U/L (ref 0–35)
AST: 18 U/L (ref 0–37)
Albumin: 4.1 g/dL (ref 3.5–5.2)
Alkaline Phosphatase: 44 U/L (ref 39–117)
BUN: 18 mg/dL (ref 6–23)
CHLORIDE: 102 meq/L (ref 96–112)
CO2: 29 meq/L (ref 19–32)
Calcium: 9.8 mg/dL (ref 8.4–10.5)
Creatinine, Ser: 1.07 mg/dL (ref 0.40–1.20)
GFR: 70.78 mL/min (ref >60.00)
GLUCOSE: 171 mg/dL — AB (ref 70–99)
POTASSIUM: 4.3 meq/L (ref 3.5–5.1)
SODIUM: 138 meq/L (ref 135–145)
TOTAL PROTEIN: 7.3 g/dL (ref 6.0–8.3)
Total Bilirubin: 0.2 mg/dL (ref 0.2–1.2)

## 2017-02-22 LAB — TSH: TSH: 1.15 u[IU]/mL (ref 0.35–4.50)

## 2017-02-22 LAB — HM PAP SMEAR: HM PAP: NORMAL

## 2017-02-22 LAB — VITAMIN D 25 HYDROXY (VIT D DEFICIENCY, FRACTURES): VITD: 31.45 ng/mL (ref 30.00–100.00)

## 2017-02-22 LAB — LDL CHOLESTEROL, DIRECT: Direct LDL: 149 mg/dL

## 2017-02-24 NOTE — Assessment & Plan Note (Signed)
With palpitations Echo ordered today

## 2017-02-24 NOTE — Assessment & Plan Note (Signed)
Has also noted some intermittent loose stool and denies bloody or tarry stool.

## 2017-02-24 NOTE — Assessment & Plan Note (Signed)
Encouraged DASH diet, decrease po intake and increase exercise as tolerated. Needs 7-8 hours of sleep nightly. Avoid trans fats, eat small, frequent meals every 4-5 hours with lean proteins, complex carbs and healthy fats. Minimize simple carbs, bariatric referral 

## 2017-02-25 ENCOUNTER — Other Ambulatory Visit: Payer: Self-pay | Admitting: Family Medicine

## 2017-02-25 ENCOUNTER — Telehealth: Payer: Self-pay | Admitting: *Deleted

## 2017-02-25 MED ORDER — GLIMEPIRIDE 1 MG PO TABS: 1.0000 mg | ORAL_TABLET | Freq: Every day | ORAL | 3 refills | Status: DC

## 2017-02-25 NOTE — Telephone Encounter (Signed)
[facility], forwarded to provider/SLS 06/11

## 2017-02-26 ENCOUNTER — Encounter: Payer: Self-pay | Admitting: Family Medicine

## 2017-03-06 ENCOUNTER — Ambulatory Visit (HOSPITAL_BASED_OUTPATIENT_CLINIC_OR_DEPARTMENT_OTHER)
Admission: RE | Admit: 2017-03-06 | Discharge: 2017-03-06 | Disposition: A | Discharge status: Home / Self Care | Payer: BC Managed Care – PPO | Source: Ambulatory Visit | Attending: Family Medicine | Admitting: Family Medicine

## 2017-03-06 DIAGNOSIS — R002 Palpitations: Secondary | ICD-10-CM | POA: Insufficient documentation

## 2017-03-06 DIAGNOSIS — Z6841 Body Mass Index (BMI) 40.0 and over, adult: Secondary | ICD-10-CM | POA: Diagnosis not present

## 2017-03-06 DIAGNOSIS — R0789 Other chest pain: Secondary | ICD-10-CM | POA: Insufficient documentation

## 2017-03-06 DIAGNOSIS — I10 Essential (primary) hypertension: Secondary | ICD-10-CM | POA: Diagnosis not present

## 2017-03-06 DIAGNOSIS — E119 Type 2 diabetes mellitus without complications: Secondary | ICD-10-CM | POA: Diagnosis not present

## 2017-03-06 DIAGNOSIS — E785 Hyperlipidemia, unspecified: Secondary | ICD-10-CM | POA: Insufficient documentation

## 2017-03-06 NOTE — Progress Notes (Signed)
  Echocardiogram 2D Echocardiogram has been performed.  Arvil ChacoFoster, Karanveer Ramakrishnan 03/06/2017, 9:30 AM

## 2017-03-12 ENCOUNTER — Ambulatory Visit (HOSPITAL_BASED_OUTPATIENT_CLINIC_OR_DEPARTMENT_OTHER)
Admission: RE | Admit: 2017-03-12 | Discharge: 2017-03-12 | Disposition: A | Discharge status: Home / Self Care | Payer: BC Managed Care – PPO | Source: Ambulatory Visit | Attending: Family Medicine | Admitting: Family Medicine

## 2017-03-12 DIAGNOSIS — Z1231 Encounter for screening mammogram for malignant neoplasm of breast: Secondary | ICD-10-CM | POA: Diagnosis present

## 2017-03-12 DIAGNOSIS — Z1239 Encounter for other screening for malignant neoplasm of breast: Secondary | ICD-10-CM

## 2017-03-25 ENCOUNTER — Encounter: Payer: Self-pay | Admitting: Gastroenterology

## 2017-05-09 ENCOUNTER — Ambulatory Visit: Payer: BC Managed Care – PPO | Admitting: Gastroenterology

## 2017-05-09 ENCOUNTER — Encounter: Payer: Self-pay | Admitting: Family Medicine

## 2017-08-22 ENCOUNTER — Ambulatory Visit: Payer: BC Managed Care – PPO | Admitting: Family Medicine

## 2017-11-08 ENCOUNTER — Ambulatory Visit: Payer: BC Managed Care – PPO | Admitting: Family Medicine

## 2017-11-08 ENCOUNTER — Other Ambulatory Visit: Payer: Self-pay | Admitting: Family Medicine

## 2017-11-08 ENCOUNTER — Encounter: Payer: Self-pay | Admitting: Family Medicine

## 2017-11-08 VITALS — BP 130/88 | HR 104 | Temp 98.8°F | Ht 65.0 in | Wt 246.5 lb

## 2017-11-08 DIAGNOSIS — E1169 Type 2 diabetes mellitus with other specified complication: Secondary | ICD-10-CM | POA: Diagnosis not present

## 2017-11-08 DIAGNOSIS — E669 Obesity, unspecified: Secondary | ICD-10-CM

## 2017-11-08 LAB — COMPREHENSIVE METABOLIC PANEL
ALK PHOS: 59 U/L (ref 39–117)
ALT: 21 U/L (ref 0–35)
AST: 18 U/L (ref 0–37)
Albumin: 3.9 g/dL (ref 3.5–5.2)
BILIRUBIN TOTAL: 0.4 mg/dL (ref 0.2–1.2)
BUN: 12 mg/dL (ref 6–23)
CALCIUM: 9.3 mg/dL (ref 8.4–10.5)
CO2: 26 meq/L (ref 19–32)
Chloride: 101 mEq/L (ref 96–112)
Creatinine, Ser: 0.9 mg/dL (ref 0.40–1.20)
GFR: 86.16 mL/min (ref >60.00)
GLUCOSE: 326 mg/dL — AB (ref 70–99)
POTASSIUM: 3.9 meq/L (ref 3.5–5.1)
Sodium: 137 mEq/L (ref 135–145)
Total Protein: 6.9 g/dL (ref 6.0–8.3)

## 2017-11-08 LAB — POC URINALSYSI DIPSTICK (AUTOMATED)
BILIRUBIN UA: NEGATIVE
Ketones, UA: NEGATIVE
LEUKOCYTES UA: NEGATIVE
NITRITE UA: NEGATIVE
PH UA: 6 (ref 5.0–8.0)
Protein, UA: NEGATIVE
RBC UA: NEGATIVE
Spec Grav, UA: 1.025 (ref 1.010–1.025)
UROBILINOGEN UA: 0.2 U/dL

## 2017-11-08 LAB — HEMOGLOBIN A1C: HEMOGLOBIN A1C: 9.9 % — AB (ref 4.6–6.5)

## 2017-11-08 MED ORDER — GLIMEPIRIDE 1 MG PO TABS: 2.0000 mg | ORAL_TABLET | Freq: Two times a day (BID) | ORAL | 3 refills | Status: DC

## 2017-11-08 NOTE — Progress Notes (Signed)
Pre visit review using our clinic review tool, if applicable. No additional management support is needed unless otherwise documented below in the visit note. 

## 2017-11-08 NOTE — Progress Notes (Signed)
Chief Complaint  Patient presents with  . Back Pain  . Urinary Frequency  . Insomnia    Chelsea Bray is a 47 y.o. female here for urinary frequency.  This has been going on for 2.5 mo. She is always thirsty. She denies any pain, bleeding, d/c, incomplete emptying, new sex partners. She has had UTIs in past, this does not feel similar. L upper back pain, concerned about kidneys. Diet is poor, no change in PO intake of fluids. No med changes. BM's fluctuate.  ROS:  Constitutional: denies fever GU: As noted in HPI  Past Medical History:  Diagnosis Date  . Acid reflux disease   . Colon polyps 02/21/2017  . Diabetes ([facility]) 11/17/2011  . Diabetes mellitus type 2 in obese ([facility]) 11/17/2011  . History of esophageal stricture 02/21/2017  . History of esophageal stricture 02/21/2017  . Hyperlipidemia   . Hypertension   . Insomnia 03/08/2016  . Morbid obesity ([facility]) 01/10/2013  . Pain in joint, upper arm 11/08/2015  . Preventative health care 11/29/2013  . TOS (thoracic outlet syndrome) 11/20/2015  . Vaginitis and vulvovaginitis 12/23/2014    BP 130/88 (BP Location: Left Arm, Patient Position: Sitting, Cuff Size: Large)   Pulse (!) 104   Temp 98.8 F (37.1 C) (Oral)   Ht 5\' 5"  (1.651 m)   Wt 246 lb 8 oz (111.8 kg)   SpO2 99%   BMI 41.02 kg/m  General: Awake, alert, appears stated age HEENT: MMM Heart: RRR Lungs: CTAB, normal respiratory effort, no accessory muscle usage Abd: BS+, soft, NT, ND, no masses or organomegaly MSK: No CVA tenderness, neg Lloyd's sign Psych: Age appropriate judgment and insight  Diabetes mellitus type 2 in obese (HCC) - Plan: Hemoglobin A1c, Comprehensive metabolic panel, POCT Urinalysis Dipstick (Automated)  Orders as above. Sounds more like DM given duration. Ck A1c, UA neg other than +glucose. Stay hydrated. Counseled on diet and exercise. Seek immediate care if pt starts to develop fevers, new/worsening symptoms, uncontrollable N/V. F/u in around 6 weeks  with reg pcp to discuss sugars. The patient voiced understanding and agreement to the plan.  Jilda Rocheicholas Paul ErlangerWendling, DO 11/08/17 11:59 AM

## 2017-11-08 NOTE — Patient Instructions (Signed)
Aim to do some physical exertion for 150 minutes per week. This is typically divided into 5 days per week, 30 minutes per day. The activity should be enough to get your heart rate up. Anything is better than nothing if you have time constraints.  Stay well hydrated.  Healthy Eating Plan Many factors influence your heart health, including eating and exercise habits. Heart (coronary) risk increases with abnormal blood fat (lipid) levels. Heart-healthy meal planning includes limiting unhealthy fats, increasing healthy fats, and making other small dietary changes. This includes maintaining a healthy body weight to help keep lipid levels within a normal range.  WHAT IS MY PLAN?  Your health care provider recommends that you:  Drink a glass of water before meals to help with satiety.  Eat slowly.  An alternative to the water is to add Metamucil. This will help with satiety as well. It does contain calories, unlike water.  WHAT TYPES OF FAT SHOULD I CHOOSE?  Choose healthy fats more often. Choose monounsaturated and polyunsaturated fats, such as olive oil and canola oil, flaxseeds, walnuts, almonds, and seeds.  Eat more omega-3 fats. Good choices include salmon, mackerel, sardines, tuna, flaxseed oil, and ground flaxseeds. Aim to eat fish at least two times each week.  Avoid foods with partially hydrogenated oils in them. These contain trans fats. Examples of foods that contain trans fats are stick margarine, some tub margarines, cookies, crackers, and other baked goods. If you are going to avoid a fat, this is the one to avoid!  WHAT GENERAL GUIDELINES DO I NEED TO FOLLOW?  Check food labels carefully to identify foods with trans fats. Avoid these types of options when possible.  Fill one half of your plate with vegetables and green salads. Eat 4-5 servings of vegetables per day. A serving of vegetables equals 1 cup of raw leafy vegetables,  cup of raw or cooked cut-up vegetables, or  cup of  vegetable juice.  Fill one fourth of your plate with whole grains. Look for the word "whole" as the first word in the ingredient list.  Fill one fourth of your plate with lean protein foods.  Eat 4-5 servings of fruit per day. A serving of fruit equals one medium whole fruit,  cup of dried fruit,  cup of fresh, frozen, or canned fruit. Try to avoid fruits in cups/syrups as the sugar content can be high.  Eat more foods that contain soluble fiber. Examples of foods that contain this type of fiber are apples, broccoli, carrots, beans, peas, and barley. Aim to get 20-30 g of fiber per day.  Eat more home-cooked food and less restaurant, buffet, and fast food.  Limit or avoid alcohol.  Limit foods that are high in starch and sugar.  Avoid fried foods when able.  Cook foods by using methods other than frying. Baking, boiling, grilling, and broiling are all great options. Other fat-reducing suggestions include: ? Removing the skin from poultry. ? Removing all visible fats from meats. ? Skimming the fat off of stews, soups, and gravies before serving them. ? Steaming vegetables in water or broth.  Lose weight if you are overweight. Losing just 5-10% of your initial body weight can help your overall health and prevent diseases such as diabetes and heart disease.  Increase your consumption of nuts, legumes, and seeds to 4-5 servings per week. One serving of dried beans or legumes equals  cup after being cooked, one serving of nuts equals 1 ounces, and one serving of seeds  equals  ounce or 1 tablespoon.  WHAT ARE GOOD FOODS CAN I EAT? Grains Grainy breads (try to find bread that is 3 g of fiber per slice or greater), oatmeal, light popcorn. Whole-grain cereals. Rice and pasta, including brown rice and those that are made with whole wheat. Edamame pasta is a great alternative to grain pasta. It has a higher protein content. Try to avoid significant consumption of white bread, sugary cereals,  or pastries/baked goods.  Vegetables All vegetables. Cooked white potatoes do not count as vegetables.  Fruits All fruits, but limit pineapple and bananas as these fruits have a higher sugar content.  Meats and Other Protein Sources Lean, well-trimmed beef, veal, pork, and lamb. Chicken and [place] without skin. All fish and shellfish. Wild duck, rabbit, pheasant, and venison. Egg whites or low-cholesterol egg substitutes. Dried beans, peas, lentils, and tofu.Seeds and most nuts.  Dairy Low-fat or nonfat cheeses, including ricotta, string, and mozzarella. Skim or 1% milk that is liquid, powdered, or evaporated. Buttermilk that is made with low-fat milk. Nonfat or low-fat yogurt. Soy/Almond milk are good alternatives if you cannot handle dairy.  Beverages Water is the best for you. Sports drinks with less sugar are more desirable unless you are a highly active athlete.  Sweets and Desserts Sherbets and fruit ices. Honey, jam, marmalade, jelly, and syrups. Dark chocolate.  Eat all sweets and desserts in moderation.  Fats and Oils Nonhydrogenated (trans-free) margarines. Vegetable oils, including soybean, sesame, sunflower, olive, peanut, safflower, corn, canola, and cottonseed. Salad dressings or mayonnaise that are made with a vegetable oil. Limit added fats and oils that you use for cooking, baking, salads, and as spreads.  Other Cocoa powder. Coffee and tea. Most condiments.  The items listed above may not be a complete list of recommended foods or beverages. Contact your dietitian for more options.

## 2017-11-11 ENCOUNTER — Ambulatory Visit: Payer: BC Managed Care – PPO | Admitting: Family

## 2017-11-17 ENCOUNTER — Encounter: Payer: Self-pay | Admitting: Family Medicine

## 2017-11-18 ENCOUNTER — Other Ambulatory Visit: Payer: Self-pay | Admitting: Family Medicine

## 2017-11-18 DIAGNOSIS — E1169 Type 2 diabetes mellitus with other specified complication: Secondary | ICD-10-CM

## 2017-12-12 ENCOUNTER — Other Ambulatory Visit: Payer: Self-pay | Admitting: Family Medicine

## 2017-12-27 ENCOUNTER — Encounter: Payer: Self-pay | Admitting: Family Medicine

## 2017-12-27 ENCOUNTER — Ambulatory Visit: Payer: BC Managed Care – PPO | Admitting: Family Medicine

## 2017-12-27 DIAGNOSIS — M79646 Pain in unspecified finger(s): Secondary | ICD-10-CM | POA: Insufficient documentation

## 2017-12-27 DIAGNOSIS — I10 Essential (primary) hypertension: Secondary | ICD-10-CM | POA: Diagnosis not present

## 2017-12-27 DIAGNOSIS — M79645 Pain in left finger(s): Secondary | ICD-10-CM

## 2017-12-27 DIAGNOSIS — E782 Mixed hyperlipidemia: Secondary | ICD-10-CM | POA: Diagnosis not present

## 2017-12-27 DIAGNOSIS — E669 Obesity, unspecified: Secondary | ICD-10-CM

## 2017-12-27 DIAGNOSIS — E1169 Type 2 diabetes mellitus with other specified complication: Secondary | ICD-10-CM | POA: Diagnosis not present

## 2017-12-27 DIAGNOSIS — G4733 Obstructive sleep apnea (adult) (pediatric): Secondary | ICD-10-CM | POA: Diagnosis not present

## 2017-12-27 LAB — URIC ACID: Uric Acid, Serum: 6.5 mg/dL (ref 2.4–7.0)

## 2017-12-27 MED ORDER — NAPROXEN 375 MG PO TABS: 375.0000 mg | ORAL_TABLET | Freq: Two times a day (BID) | ORAL | 2 refills | Status: DC

## 2017-12-27 MED ORDER — HYOSCYAMINE SULFATE 0.125 MG SL SUBL: 0.1250 mg | SUBLINGUAL_TABLET | SUBLINGUAL | 1 refills | Status: DC | PRN

## 2017-12-27 NOTE — Assessment & Plan Note (Addendum)
hgba1c not acceptable, minimize simple carbs. Increase exercise as tolerated. She is tolerating recent medication changes and if she does see a significant improvement in her hgba1c we have discussed referral to endocrinology

## 2017-12-27 NOTE — Assessment & Plan Note (Signed)
Well controlled, no changes to meds. Encouraged heart healthy diet such as the DASH diet and exercise as tolerated.  °

## 2017-12-27 NOTE — Assessment & Plan Note (Signed)
referred to sports med. Splinted, ice, lidocaine, Naproxen 375 mg twice daily

## 2017-12-27 NOTE — Assessment & Plan Note (Signed)
Encouraged DASH diet, decrease po intake and increase exercise as tolerated. Needs 7-8 hours of sleep nightly. Avoid trans fats, eat small, frequent meals every 4-5 hours with lean proteins, complex carbs and healthy fats. Minimize simple carbs, bariatric referral 

## 2017-12-27 NOTE — Patient Instructions (Addendum)
Tylenol/Acetaminophen max 3000 mg in 24 hours Tylenol Arthritis 650 twice daily  Consider Miralax and Benefiber together once or twice daily  Encouraged increased hydration and fiber in diet. Daily probiotics. If bowels not moving can use MOM 2 tbls po in 4 oz of warm prune juice by mouth every 2-3 days. If no results then repeat in 4 hours with  Dulcolax suppository pr, may repeat again in 4 more hours as needed. Seek care if symptoms worsen. Consider daily Miralax and/or Dulcolax if symptoms persist.  Arthritis Arthritis means joint pain. It can also mean joint disease. A joint is a place where bones come together. People who have arthritis may have:  Red joints.  Swollen joints.  Stiff joints.  Warm joints.  A fever.  A feeling of being sick.  Follow these instructions at home: Pay attention to any changes in your symptoms. Take these actions to help with your pain and swelling. Medicines  Take over-the-counter and prescription medicines only as told by your doctor.  Do not take aspirin for pain if your doctor says that you may have gout. Activity  Rest your joint if your doctor tells you to.  Avoid activities that make the pain worse.  Exercise your joint regularly as told by your doctor. Try doing exercises like: ? Swimming. ? Water aerobics. ? Biking. ? Walking. Joint Care   If your joint is swollen, keep it raised (elevated) if told by your doctor.  If your joint feels stiff in the morning, try taking a warm shower.  If you have diabetes, do not apply heat without asking your doctor.  If told, apply heat to the joint: ? Put a towel between the joint and the hot pack or heating pad. ? Leave the heat on the area for 20-30 minutes.  If told, apply ice to the joint: ? Put ice in a plastic bag. ? Place a towel between your skin and the bag. ? Leave the ice on for 20 minutes, 2-3 times per day.  Keep all follow-up visits as told by your doctor. Contact a  doctor if:  The pain gets worse.  You have a fever. Get help right away if:  You have very bad pain in your joint.  You have swelling in your joint.  Your joint is red.  Many joints become painful and swollen.  You have very bad back pain.  Your leg is very weak.  You cannot control your pee (urine) or poop (stool). This information is not intended to replace advice given to you by your health care provider. Make sure you discuss any questions you have with your health care provider. Document Released: 11/28/2009 Document Revised: 02/09/2016 Document Reviewed: 11/29/2014 Elsevier Interactive Patient Education  [employer]2018 Elsevier Inc.

## 2017-12-27 NOTE — Progress Notes (Signed)
Subjective:  I acted as a Education administrator for [employer]. [name], [place]   Patient ID: Chelsea Bray, female    DOB: 1970-10-01, 47 y.o.   MRN: [id number]  Chief Complaint  Patient presents with  . Follow-up    HPI  Patient is in today for follow up visit. Patient complains of left wrist pain and left thumb pain. She notes she has had no fall or injury. She notes her thumbs often lock up and are difficult to get moving again. Left thumb is worse than right. Continues to struggle with right sided neck pain and is getting some relief with topical lidocaine. No recent febrile illness or hospitalizations. Denies CP/palp/SOB/HA/congestion/fevers/GI or GU c/o. Taking meds as prescribed  Patient Care Team: Mosie Lukes, MD as PCP - General (Family Medicine)   Past Medical History:  Diagnosis Date  . Acid reflux disease   . Colon polyps 02/21/2017  . Diabetes ([place]) 11/17/2011  . Diabetes mellitus type 2 in obese ([place]) 11/17/2011  . History of esophageal stricture 02/21/2017  . Hyperlipidemia   . Hypertension   . Insomnia 03/08/2016  . Morbid obesity ([place]) 01/10/2013  . Pain in joint, upper arm 11/08/2015  . Preventative health care 11/29/2013  . TOS (thoracic outlet syndrome) 11/20/2015  . Vaginitis and vulvovaginitis 12/23/2014    Past Surgical History:  Procedure Laterality Date  . BREAST REDUCTION SURGERY  2001  . CEREBRAL MICROVASCULAR DECOMPRESSION  2006   back of her head  . CHOLECYSTECTOMY    . GALLBLADDER SURGERY  1991   gall stone removed  . MOLE REMOVAL      Family History  Problem Relation Age of Onset  . Heart disease Maternal Grandmother        MI  . Stroke Maternal Grandmother   . Diabetes Maternal Grandmother   . Arrhythmia Mother   . Diabetes Mother        maternal grandmother and aunts  . Hypertension Mother   . Heart attack Father   . Stroke Father   . Hyperlipidemia Father   . Hypertension Father   . Cancer Father        lung, smoker  . Heart disease Father      CABG x 4  . Prostate cancer Maternal Grandfather   . Cancer Maternal Grandfather        colon cancer, prostate  . Alcohol abuse Paternal Grandmother        died of pneumonia  . Heart disease Paternal Grandfather        MI  . Asthma Daughter        allergies  . Hypertension Unknown        parents and maternal parents  . Breast cancer Neg Hx     Social History   Socioeconomic History  . Marital status: Married    Spouse name: Not on file  . Number of children: Not on file  . Years of education: Not on file  . Highest education level: Not on file  Occupational History  . Occupation: Designer, fashion/clothing  . Financial resource strain: Not on file  . Food insecurity:    Worry: Not on file    Inability: Not on file  . Transportation needs:    Medical: Not on file    Non-medical: Not on file  Tobacco Use  . Smoking status: Never Smoker  . Smokeless tobacco: Never Used  Substance and Sexual Activity  . Alcohol use: Yes  Alcohol/week: 0.0 oz    Comment: wine ocassion  . Drug use: No  . Sexual activity: Yes    Birth control/protection: None    Comment: avoid dairy, lives with husband, daughters. works at school bus driver  Lifestyle  . Physical activity:    Days per week: Not on file    Minutes per session: Not on file  . Stress: Not on file  Relationships  . Social connections:    Talks on phone: Not on file    Gets together: Not on file    Attends religious service: Not on file    Active member of club or organization: Not on file    Attends meetings of clubs or organizations: Not on file    Relationship status: Not on file  . Intimate partner violence:    Fear of current or ex partner: Not on file    Emotionally abused: Not on file    Physically abused: Not on file    Forced sexual activity: Not on file  Other Topics Concern  . Not on file  Social History Narrative  . Not on file    Outpatient Medications Prior to Visit  Medication Sig Dispense  Refill  . glimepiride (AMARYL) 1 MG tablet Take 2 tablets (2 mg total) by mouth 2 (two) times daily. 120 tablet 3  . glucose monitoring kit (FREESTYLE) monitoring kit 1 each by Does not apply route 3 (three) times a week. Or any monitor that insurance will cover  DX:790.29 1 each 0  . hydrochlorothiazide (HYDRODIURIL) 25 MG tablet Take 1 tablet (25 mg total) by mouth daily. 90 tablet 1  . hyoscyamine (LEVSIN SL) 0.125 MG SL tablet Place 1 tablet (0.125 mg total) under the tongue every 4 (four) hours as needed. 30 tablet 1  . Lancets (FREESTYLE) lancets DX: 790.29 100 each 0  . metoprolol succinate (TOPROL-XL) 50 MG 24 hr tablet TAKE 1 TABLET BY MOUTH DAILY WITH OR IMMEDIATELY FOLLOWING A MEAL 30 tablet 5  . Naltrexone-Bupropion HCl ER (CONTRAVE) 8-90 MG TB12 Taper up to 2 tablets twice a day.  Start with one tablet and add one tablet each week 120 tablet 3  . Vitamin D, Ergocalciferol, (DRISDOL) 50000 units CAPS capsule TAKE 1 CAPSULE BY MOUTH WEEKLY 12 capsule 01  . zolpidem (AMBIEN) 10 MG tablet Take 1 tablet (10 mg total) by mouth at bedtime as needed for sleep. 30 tablet 3   No facility-administered medications prior to visit.     No Known Allergies  Review of Systems  Constitutional: Negative for fever and malaise/fatigue.  HENT: Negative for congestion.   Eyes: Negative for blurred vision.  Respiratory: Negative for shortness of breath.   Cardiovascular: Negative for chest pain, palpitations and leg swelling.  Gastrointestinal: Negative for abdominal pain, blood in stool and nausea.  Genitourinary: Negative for dysuria and frequency.  Musculoskeletal: Positive for joint pain and neck pain. Negative for falls.  Skin: Negative for rash.  Neurological: Negative for dizziness, loss of consciousness and headaches.  Endo/Heme/Allergies: Negative for environmental allergies.  Psychiatric/Behavioral: Negative for depression. The patient is not nervous/anxious.        Objective:      Physical Exam  Constitutional: She is oriented to person, place, and time. She appears well-developed and well-nourished. No distress.  HENT:  Head: Normocephalic and atraumatic.  Nose: Nose normal.  Eyes: Right eye exhibits no discharge. Left eye exhibits no discharge.  Neck: Normal range of motion. Neck supple.  Cardiovascular: Normal  rate and regular rhythm.  No murmur heard. Pulmonary/Chest: Effort normal and breath sounds normal.  Abdominal: Soft. Bowel sounds are normal. There is no tenderness.  Musculoskeletal: She exhibits no edema.  Neurological: She is alert and oriented to person, place, and time.  Skin: Skin is warm and dry.  Psychiatric: She has a normal mood and affect.  Nursing note and vitals reviewed.   BP 128/80 (BP Location: Left Arm, Patient Position: Sitting, Cuff Size: Large)   Pulse 70   Temp 98.5 F (36.9 C) (Oral)   Resp 16   Ht 5' 4.96" (1.65 m)   Wt 244 lb 6.4 oz (110.9 kg)   SpO2 96%   BMI 40.72 kg/m  Wt Readings from Last 3 Encounters:  12/27/17 244 lb 6.4 oz (110.9 kg)  11/08/17 246 lb 8 oz (111.8 kg)  02/21/17 240 lb 6.4 oz (109 kg)   BP Readings from Last 3 Encounters:  12/27/17 128/80  11/08/17 130/88  02/21/17 107/64     Immunization History  Administered Date(s) Administered  . Influenza Split 06/13/2012  . Influenza,inj,Quad PF,6+ Mos 06/27/2016  . Pneumococcal Conjugate-13 01/12/2016  . Tdap 02/28/2004, 11/23/2013    Health Maintenance  Topic Date Due  . PNEUMOCOCCAL POLYSACCHARIDE VACCINE (1) 06/10/1972  . FOOT EXAM  01/11/2017  . OPHTHALMOLOGY EXAM  02/09/2017  . URINE MICROALBUMIN  02/15/2017  . INFLUENZA VACCINE  04/17/2018  . HEMOGLOBIN A1C  05/08/2018  . PAP SMEAR  02/23/2020  . TETANUS/TDAP  11/24/2023  . HIV Screening  Completed    Lab Results  Component Value Date   WBC 9.2 02/21/2017   HGB 13.8 02/21/2017   HCT 41.5 02/21/2017   PLT 399.0 02/21/2017   GLUCOSE 326 (H) 11/08/2017   CHOL 217 (H) 02/21/2017    TRIG 229.0 (H) 02/21/2017   HDL 40.10 02/21/2017   LDLDIRECT 149.0 02/21/2017   LDLCALC 120 (H) 05/30/2016   ALT 21 11/08/2017   AST 18 11/08/2017   NA 137 11/08/2017   K 3.9 11/08/2017   CL 101 11/08/2017   CREATININE 0.90 11/08/2017   BUN 12 11/08/2017   CO2 26 11/08/2017   TSH 1.15 02/21/2017   HGBA1C 9.9 (H) 11/08/2017   MICROALBUR 1.4 02/16/2016    Lab Results  Component Value Date   TSH 1.15 02/21/2017   Lab Results  Component Value Date   WBC 9.2 02/21/2017   HGB 13.8 02/21/2017   HCT 41.5 02/21/2017   MCV 94.1 02/21/2017   PLT 399.0 02/21/2017   Lab Results  Component Value Date   NA 137 11/08/2017   K 3.9 11/08/2017   CO2 26 11/08/2017   GLUCOSE 326 (H) 11/08/2017   BUN 12 11/08/2017   CREATININE 0.90 11/08/2017   BILITOT 0.4 11/08/2017   ALKPHOS 59 11/08/2017   AST 18 11/08/2017   ALT 21 11/08/2017   PROT 6.9 11/08/2017   ALBUMIN 3.9 11/08/2017   CALCIUM 9.3 11/08/2017   ANIONGAP 10 03/22/2015   GFR 86.16 11/08/2017   Lab Results  Component Value Date   CHOL 217 (H) 02/21/2017   Lab Results  Component Value Date   HDL 40.10 02/21/2017   Lab Results  Component Value Date   LDLCALC 120 (H) 05/30/2016   Lab Results  Component Value Date   TRIG 229.0 (H) 02/21/2017   Lab Results  Component Value Date   CHOLHDL 5 02/21/2017   Lab Results  Component Value Date   HGBA1C 9.9 (H) 11/08/2017  Assessment & Plan:   Problem List Items Addressed This Visit    Hyperlipidemia, mixed    Encouraged heart healthy diet, increase exercise, avoid trans fats, consider a krill oil cap daily      OSA (obstructive sleep apnea)    Using CPAP nightly now      Diabetes mellitus type 2 in obese ([facility])    hgba1c not acceptable, minimize simple carbs. Increase exercise as tolerated. She is tolerating recent medication changes and if she does see a significant improvement in her hgba1c we have discussed referral to endocrinology       Relevant Orders   Hemoglobin A1c   Lipid panel   Essential hypertension    Well controlled, no changes to meds. Encouraged heart healthy diet such as the DASH diet and exercise as tolerated.       Relevant Orders   CBC   Comprehensive metabolic panel   Lipid panel   TSH   Morbid obesity ([place])    Encouraged DASH diet, decrease po intake and increase exercise as tolerated. Needs 7-8 hours of sleep nightly. Avoid trans fats, eat small, frequent meals every 4-5 hours with lean proteins, complex carbs and healthy fats. Minimize simple carbs, bariatric referral       Thumb pain    referred to sports med. Splinted, ice, lidocaine, Naproxen 375 mg twice daily      Relevant Orders   Ambulatory referral to Sports Medicine   DG Finger Thumb Left   Rheumatoid Factor (Completed)   Uric acid (Completed)   Antinuclear Antib (ANA)      I am having Chelsea Bray start on hyoscyamine and naproxen. I am also having her maintain her freestyle, glucose monitoring kit, Naltrexone-buPROPion HCl ER, Vitamin D (Ergocalciferol), hydrochlorothiazide, hyoscyamine, zolpidem, glimepiride, and metoprolol succinate.  Meds ordered this encounter  Medications  . hyoscyamine (LEVSIN SL) 0.125 MG SL tablet    Sig: Place 1 tablet (0.125 mg total) under the tongue every 4 (four) hours as needed for cramping.    Dispense:  30 tablet    Refill:  1  . naproxen (NAPROSYN) 375 MG tablet    Sig: Take 1 tablet (375 mg total) by mouth 2 (two) times daily with a meal.    Dispense:  60 tablet    Refill:  2    CMA served as scribe during this visit. History, Physical and Plan performed by medical provider. Documentation and orders reviewed and attested to.  Penni Homans, MD

## 2017-12-29 NOTE — Assessment & Plan Note (Signed)
Encouraged heart healthy diet, increase exercise, avoid trans fats, consider a krill oil cap daily 

## 2017-12-29 NOTE — Assessment & Plan Note (Signed)
Using CPAP nightly now 

## 2017-12-31 LAB — ANA: Anti Nuclear Antibody(ANA): NEGATIVE

## 2017-12-31 LAB — RHEUMATOID FACTOR: Rhuematoid fact SerPl-aCnc: <14 IU/mL (ref <14)

## 2018-02-20 ENCOUNTER — Encounter: Payer: Self-pay | Admitting: Family Medicine

## 2018-02-21 ENCOUNTER — Other Ambulatory Visit: Payer: Self-pay | Admitting: Family Medicine

## 2018-02-25 ENCOUNTER — Other Ambulatory Visit: Payer: Self-pay | Admitting: Family Medicine

## 2018-02-25 MED ORDER — AZELASTINE HCL 0.05 % OP SOLN: 1.0000 [drp] | Freq: Two times a day (BID) | OPHTHALMIC | 1 refills | Status: AC

## 2018-03-04 ENCOUNTER — Other Ambulatory Visit: Payer: Self-pay | Admitting: Family Medicine

## 2018-03-13 ENCOUNTER — Encounter: Payer: BC Managed Care – PPO | Admitting: Family Medicine

## 2018-04-09 ENCOUNTER — Encounter: Payer: Self-pay | Admitting: Family Medicine

## 2018-04-09 ENCOUNTER — Ambulatory Visit: Payer: BC Managed Care – PPO | Admitting: Family Medicine

## 2018-04-09 VITALS — BP 120/84 | HR 77 | Temp 98.9°F | Ht 65.0 in | Wt 243.1 lb

## 2018-04-09 DIAGNOSIS — R739 Hyperglycemia, unspecified: Secondary | ICD-10-CM

## 2018-04-09 DIAGNOSIS — E669 Obesity, unspecified: Secondary | ICD-10-CM | POA: Diagnosis not present

## 2018-04-09 DIAGNOSIS — G5603 Carpal tunnel syndrome, bilateral upper limbs: Secondary | ICD-10-CM

## 2018-04-09 DIAGNOSIS — E1169 Type 2 diabetes mellitus with other specified complication: Secondary | ICD-10-CM | POA: Diagnosis not present

## 2018-04-09 LAB — GLUCOSE, POCT (MANUAL RESULT ENTRY): POC GLUCOSE: 169 mg/dL — AB (ref 70–99)

## 2018-04-09 MED ORDER — METFORMIN HCL ER 500 MG PO TB24: 500.0000 mg | ORAL_TABLET | Freq: Every day | ORAL | 1 refills | Status: DC

## 2018-04-09 NOTE — Progress Notes (Signed)
Chief Complaint  Patient presents with  . Follow-up    blood sugar high    Subjective: Patient is a 47 y.o. female here for elevated sugars.  Over past couple days has been having sugars ranging from 195-314. She has been eating normally overall. Taking medications as written. Going to start cutting down on sweet tea and carbs. She is doing some walking. Admits she needs to do more. +intermittent hypoglycemia if she doesn't eat. Compliant with Amaryl. Not currently on Metformin, did OK with ER in past. Last A1c 9.9 in Feb, 19.   Has been having tingling in 1st 3 digits b/l. No weakness, not dropping things. Given generic wrist splints that help a little. Stretching helps.    ROS: Heart: Denies chest pain  Lungs: Denies SOB   Past Medical History:  Diagnosis Date  . Acid reflux disease   . Colon polyps 02/21/2017  . Diabetes ([facility]) 11/17/2011  . Diabetes mellitus type 2 in obese ([facility]) 11/17/2011  . History of esophageal stricture 02/21/2017  . Hyperlipidemia   . Hypertension   . Insomnia 03/08/2016  . Morbid obesity ([facility]) 01/10/2013  . Pain in joint, upper arm 11/08/2015  . Preventative health care 11/29/2013  . TOS (thoracic outlet syndrome) 11/20/2015  . Vaginitis and vulvovaginitis 12/23/2014    Objective: BP 120/84 (BP Location: Left Arm, Patient Position: Sitting, Cuff Size: Large)   Pulse 77   Temp 98.9 F (37.2 C) (Oral)   Ht 5\' 5"  (1.651 m)   Wt 243 lb 2 oz (110.3 kg)   SpO2 96%   BMI 40.46 kg/m  General: Awake, appears stated age HEENT: MMM, EOMi Heart: RRR, no murmurs Lungs: CTAB, no rales, wheezes or rhonchi. No accessory muscle use Neuro: Neg Tinel's and Phalen's b/l Msk: No atrophy or weakness in grip Psych: Age appropriate judgment and insight, normal affect and mood  Assessment and Plan: Hyperglycemia - Plan: POCT Glucose (CBG)  Diabetes mellitus type 2 in obese ([facility]) - Plan: metFORMIN (GLUCOPHAGE-XR) 500 MG 24 hr tablet  Bilateral carpal tunnel  syndrome  Orders as above. Glucose 169 today. Start back on Metformin. Counseled on diet and exercise. F/u with Dr. Abner GreenspanBlyth as originally scheduled in 1 mo.  Recommended cock up splints. Massage retinaculum. Stretch. F/u as originally scheduled. The patient voiced understanding and agreement to the plan.  Jilda Rocheicholas Paul KenwoodWendling, DO 04/09/18  4:51 PM

## 2018-04-09 NOTE — Progress Notes (Signed)
Pre visit review using our clinic review tool, if applicable. No additional management support is needed unless otherwise documented below in the visit note. 

## 2018-04-09 NOTE — Patient Instructions (Addendum)
Wrist cock up splints are helpful. Wear while driving or at night.   Aim to do some physical exertion for 150 minutes per week. This is typically divided into 5 days per week, 30 minutes per day. The activity should be enough to get your heart rate up. Anything is better than nothing if you have time constraints.  Keep the diet clean.   Make sure you eat when you have lightheadedness or jitteriness.   Let us know if you need anything.

## 2018-04-30 ENCOUNTER — Other Ambulatory Visit: Payer: BC Managed Care – PPO

## 2018-05-01 ENCOUNTER — Other Ambulatory Visit (INDEPENDENT_AMBULATORY_CARE_PROVIDER_SITE_OTHER): Payer: BC Managed Care – PPO

## 2018-05-01 DIAGNOSIS — E669 Obesity, unspecified: Secondary | ICD-10-CM

## 2018-05-01 DIAGNOSIS — E1169 Type 2 diabetes mellitus with other specified complication: Secondary | ICD-10-CM

## 2018-05-01 DIAGNOSIS — I10 Essential (primary) hypertension: Secondary | ICD-10-CM

## 2018-05-01 LAB — HEMOGLOBIN A1C: Hgb A1c MFr Bld: 9.1 % — ABNORMAL HIGH (ref 4.6–6.5)

## 2018-05-01 LAB — COMPREHENSIVE METABOLIC PANEL
ALT: 20 U/L (ref 0–35)
AST: 15 U/L (ref 0–37)
Albumin: 4.1 g/dL (ref 3.5–5.2)
Alkaline Phosphatase: 51 U/L (ref 39–117)
BILIRUBIN TOTAL: 0.5 mg/dL (ref 0.2–1.2)
BUN: 13 mg/dL (ref 6–23)
CO2: 27 meq/L (ref 19–32)
Calcium: 9.6 mg/dL (ref 8.4–10.5)
Chloride: 104 mEq/L (ref 96–112)
Creatinine, Ser: 0.95 mg/dL (ref 0.40–1.20)
GFR: 80.78 mL/min (ref >60.00)
GLUCOSE: 178 mg/dL — AB (ref 70–99)
Potassium: 4.2 mEq/L (ref 3.5–5.1)
SODIUM: 139 meq/L (ref 135–145)
Total Protein: 7 g/dL (ref 6.0–8.3)

## 2018-05-01 LAB — LIPID PANEL
CHOL/HDL RATIO: 5
Cholesterol: 203 mg/dL — ABNORMAL HIGH (ref 0–200)
HDL: 40.9 mg/dL (ref >39.00)
LDL Cholesterol: 130 mg/dL — ABNORMAL HIGH (ref 0–99)
NONHDL: 162.46
Triglycerides: 160 mg/dL — ABNORMAL HIGH (ref 0.0–149.0)
VLDL: 32 mg/dL (ref 0.0–40.0)

## 2018-05-01 LAB — CBC
HCT: 40.1 % (ref 36.0–46.0)
Hemoglobin: 13.7 g/dL (ref 12.0–15.0)
MCHC: 34 g/dL (ref 30.0–36.0)
MCV: 92.2 fl (ref 78.0–100.0)
PLATELETS: 328 10*3/uL (ref 150.0–400.0)
RBC: 4.35 Mil/uL (ref 3.87–5.11)
RDW: 13.5 % (ref 11.5–15.5)
WBC: 6 10*3/uL (ref 4.0–10.5)

## 2018-05-01 LAB — TSH: TSH: 1.41 u[IU]/mL (ref 0.35–4.50)

## 2018-05-06 ENCOUNTER — Ambulatory Visit (INDEPENDENT_AMBULATORY_CARE_PROVIDER_SITE_OTHER): Payer: BC Managed Care – PPO | Admitting: Family Medicine

## 2018-05-06 VITALS — BP 122/80 | HR 80 | Temp 98.7°F | Resp 18 | Ht 65.0 in | Wt 239.0 lb

## 2018-05-06 DIAGNOSIS — E559 Vitamin D deficiency, unspecified: Secondary | ICD-10-CM

## 2018-05-06 DIAGNOSIS — Z23 Encounter for immunization: Secondary | ICD-10-CM | POA: Diagnosis not present

## 2018-05-06 DIAGNOSIS — I10 Essential (primary) hypertension: Secondary | ICD-10-CM | POA: Diagnosis not present

## 2018-05-06 DIAGNOSIS — E669 Obesity, unspecified: Secondary | ICD-10-CM | POA: Diagnosis not present

## 2018-05-06 DIAGNOSIS — Z0000 Encounter for general adult medical examination without abnormal findings: Secondary | ICD-10-CM | POA: Diagnosis not present

## 2018-05-06 DIAGNOSIS — M25552 Pain in left hip: Secondary | ICD-10-CM

## 2018-05-06 DIAGNOSIS — G8929 Other chronic pain: Secondary | ICD-10-CM

## 2018-05-06 DIAGNOSIS — E782 Mixed hyperlipidemia: Secondary | ICD-10-CM | POA: Diagnosis not present

## 2018-05-06 DIAGNOSIS — K635 Polyp of colon: Secondary | ICD-10-CM

## 2018-05-06 DIAGNOSIS — Z1231 Encounter for screening mammogram for malignant neoplasm of breast: Secondary | ICD-10-CM

## 2018-05-06 DIAGNOSIS — Z124 Encounter for screening for malignant neoplasm of cervix: Secondary | ICD-10-CM

## 2018-05-06 DIAGNOSIS — Z1239 Encounter for other screening for malignant neoplasm of breast: Secondary | ICD-10-CM

## 2018-05-06 DIAGNOSIS — E1169 Type 2 diabetes mellitus with other specified complication: Secondary | ICD-10-CM | POA: Diagnosis not present

## 2018-05-06 MED ORDER — METFORMIN HCL ER 500 MG PO TB24: 500.0000 mg | ORAL_TABLET | Freq: Every day | ORAL | 3 refills | Status: DC

## 2018-05-06 MED ORDER — METFORMIN HCL ER 500 MG PO TB24: 500.0000 mg | ORAL_TABLET | Freq: Every day | ORAL | 1 refills | Status: DC

## 2018-05-06 MED ORDER — EZETIMIBE-SIMVASTATIN 10-10 MG PO TABS: 1.0000 | ORAL_TABLET | Freq: Every day | ORAL | 5 refills | Status: DC

## 2018-05-06 MED ORDER — VITAMIN D (ERGOCALCIFEROL) 1.25 MG (50000 UNIT) PO CAPS: ORAL_CAPSULE | ORAL | 1 refills | Status: DC

## 2018-05-06 NOTE — Progress Notes (Signed)
Subjective:  I acted as a Education administrator for Dr. Charlett Blake. [place], [place]  Patient ID: Chelsea Bray, female    DOB: [birth year], 47 y.o.   MRN: [id number]  No chief complaint on file.   HPI  Patient is in today for an annual exam and follow up on chronic medical concerns including hyperlipidemia, hypertension, obesity and vitamin d deficiency. Denies CP/palp/SOB/HA/congestion/fevers/GI or GU c/o. Taking meds as prescribed. She tries to maintain a heart healthy diet on a good day. Exercise is sporadic. No polyuria or polydipsia. Does well wth activities of daily living. No recent febrile illness or hospitalizations.  Patient Care Team: Mosie Lukes, MD as PCP - General (Family Medicine)   Past Medical History:  Diagnosis Date  . Acid reflux disease   . Colon polyps 02/21/2017  . Diabetes ([place]) 11/17/2011  . Diabetes mellitus type 2 in obese ([place]) 11/17/2011  . History of esophageal stricture 02/21/2017  . Hyperlipidemia   . Hypertension   . Insomnia 03/08/2016  . Morbid obesity ([place]) 01/10/2013  . Pain in joint, upper arm 11/08/2015  . Preventative health care 11/29/2013  . TOS (thoracic outlet syndrome) 11/20/2015  . Vaginitis and vulvovaginitis 12/23/2014    Past Surgical History:  Procedure Laterality Date  . BREAST REDUCTION SURGERY  2001  . CEREBRAL MICROVASCULAR DECOMPRESSION  2006   back of her head  . CHOLECYSTECTOMY    . GALLBLADDER SURGERY  1991   gall stone removed  . MOLE REMOVAL      Family History  Problem Relation Age of Onset  . Heart disease Maternal Grandmother        MI  . Stroke Maternal Grandmother   . Diabetes Maternal Grandmother   . Arrhythmia Mother   . Diabetes Mother        maternal grandmother and aunts  . Hypertension Mother   . Heart attack Father   . Stroke Father   . Hyperlipidemia Father   . Hypertension Father   . Cancer Father        lung, smoker  . Heart disease Father        CABG x 4  . Prostate cancer Maternal Grandfather   . Cancer  Maternal Grandfather        colon cancer, prostate  . Alcohol abuse Paternal Grandmother        died of pneumonia  . Heart disease Paternal Grandfather        MI  . Asthma Daughter        allergies  . Hypertension Unknown        parents and maternal parents  . Breast cancer Neg Hx     Social History   Socioeconomic History  . Marital status: Married    Spouse name: Not on file  . Number of children: Not on file  . Years of education: Not on file  . Highest education level: Not on file  Occupational History  . Occupation: Designer, fashion/clothing  . Financial resource strain: Not on file  . Food insecurity:    Worry: Not on file    Inability: Not on file  . Transportation needs:    Medical: Not on file    Non-medical: Not on file  Tobacco Use  . Smoking status: Never Smoker  . Smokeless tobacco: Never Used  Substance and Sexual Activity  . Alcohol use: Yes    Alcohol/week: 0.0 standard drinks    Comment: wine ocassion  . Drug use: No  .  Sexual activity: Yes    Birth control/protection: None    Comment: avoid dairy, lives with husband, daughters. works at school bus driver  Lifestyle  . Physical activity:    Days per week: Not on file    Minutes per session: Not on file  . Stress: Not on file  Relationships  . Social connections:    Talks on phone: Not on file    Gets together: Not on file    Attends religious service: Not on file    Active member of club or organization: Not on file    Attends meetings of clubs or organizations: Not on file    Relationship status: Not on file  . Intimate partner violence:    Fear of current or ex partner: Not on file    Emotionally abused: Not on file    Physically abused: Not on file    Forced sexual activity: Not on file  Other Topics Concern  . Not on file  Social History Narrative  . Not on file    Outpatient Medications Prior to Visit  Medication Sig Dispense Refill  . azelastine (OPTIVAR) 0.05 % ophthalmic  solution Place 1 drop into both eyes 2 (two) times daily. 6 mL 1  . glucose monitoring kit (FREESTYLE) monitoring kit 1 each by Does not apply route 3 (three) times a week. Or any monitor that insurance will cover  DX:790.29 1 each 0  . hydrochlorothiazide (HYDRODIURIL) 25 MG tablet Take 1 tablet (25 mg total) by mouth daily. 90 tablet 1  . hyoscyamine (LEVSIN SL) 0.125 MG SL tablet Place 1 tablet (0.125 mg total) under the tongue every 4 (four) hours as needed. 30 tablet 1  . Lancets (FREESTYLE) lancets DX: 790.29 100 each 0  . metoprolol succinate (TOPROL-XL) 50 MG 24 hr tablet TAKE 1 TABLET BY MOUTH DAILY WITH OR IMMEDIATELY FOLLOWING A MEAL 30 tablet 5  . hyoscyamine (LEVSIN SL) 0.125 MG SL tablet Place 1 tablet (0.125 mg total) under the tongue every 4 (four) hours as needed for cramping. 30 tablet 1  . metFORMIN (GLUCOPHAGE-XR) 500 MG 24 hr tablet Take 1 tablet (500 mg total) by mouth daily with breakfast. 30 tablet 1  . Naltrexone-Bupropion HCl ER (CONTRAVE) 8-90 MG TB12 Taper up to 2 tablets twice a day.  Start with one tablet and add one tablet each week 120 tablet 3  . Vitamin D, Ergocalciferol, (DRISDOL) 50000 units CAPS capsule TAKE 1 CAPSULE BY MOUTH ONCE A WEEK 12 capsule 1  . glimepiride (AMARYL) 1 MG tablet Take 2 tablets (2 mg total) by mouth 2 (two) times daily. 120 tablet 3  . naproxen (NAPROSYN) 375 MG tablet Take 1 tablet (375 mg total) by mouth 2 (two) times daily with a meal. 60 tablet 2  . zolpidem (AMBIEN) 10 MG tablet Take 1 tablet (10 mg total) by mouth at bedtime as needed for sleep. 30 tablet 3   No facility-administered medications prior to visit.     No Known Allergies  Review of Systems  Constitutional: Negative for chills, fever and malaise/fatigue.  HENT: Negative for congestion and hearing loss.   Eyes: Negative for discharge.  Respiratory: Negative for cough, sputum production and shortness of breath.   Cardiovascular: Negative for chest pain,  palpitations and leg swelling.  Gastrointestinal: Negative for abdominal pain, blood in stool, constipation, diarrhea, heartburn, nausea and vomiting.  Genitourinary: Negative for dysuria, frequency, hematuria and urgency.  Musculoskeletal: Negative for back pain, falls and myalgias.  Skin:  Negative for rash.  Neurological: Negative for dizziness, sensory change, loss of consciousness, weakness and headaches.  Endo/Heme/Allergies: Negative for environmental allergies. Does not bruise/bleed easily.  Psychiatric/Behavioral: Negative for depression and suicidal ideas. The patient is not nervous/anxious and does not have insomnia.        Objective:    Physical Exam  Constitutional: She is oriented to person, place, and time. She appears well-developed and well-nourished. No distress.  HENT:  Head: Normocephalic and atraumatic.  Eyes: Conjunctivae are normal.  Neck: Neck supple. No thyromegaly present.  Cardiovascular: Normal rate, regular rhythm and normal heart sounds.  No murmur heard. Pulmonary/Chest: Effort normal and breath sounds normal. No respiratory distress.  Abdominal: Soft. Bowel sounds are normal. She exhibits no distension and no mass. There is no tenderness.  Musculoskeletal: She exhibits no edema.  Lymphadenopathy:    She has no cervical adenopathy.  Neurological: She is alert and oriented to person, place, and time.  Skin: Skin is warm and dry.  Psychiatric: She has a normal mood and affect. Her behavior is normal.    BP 122/80 (BP Location: Left Arm, Patient Position: Sitting, Cuff Size: Normal)   Pulse 80   Temp 98.7 F (37.1 C) (Oral)   Resp 18   Ht 5' 5" (1.651 m)   Wt 239 lb (108.4 kg)   SpO2 98%   BMI 39.77 kg/m  Wt Readings from Last 3 Encounters:  05/06/18 239 lb (108.4 kg)  04/09/18 243 lb 2 oz (110.3 kg)  12/27/17 244 lb 6.4 oz (110.9 kg)   BP Readings from Last 3 Encounters:  05/06/18 122/80  04/09/18 120/84  12/27/17 128/80      Immunization History  Administered Date(s) Administered  . Influenza Split 06/13/2012  . Influenza,inj,Quad PF,6+ Mos 06/27/2016, 05/06/2018  . Pneumococcal Conjugate-13 01/12/2016  . Tdap 02/28/2004, 11/23/2013    Health Maintenance  Topic Date Due  . PNEUMOCOCCAL POLYSACCHARIDE VACCINE AGE 74-64 HIGH RISK  06/10/1972  . FOOT EXAM  01/11/2017  . OPHTHALMOLOGY EXAM  02/09/2017  . URINE MICROALBUMIN  02/15/2017  . HEMOGLOBIN A1C  11/01/2018  . PAP SMEAR  02/23/2020  . TETANUS/TDAP  11/24/2023  . INFLUENZA VACCINE  Completed  . HIV Screening  Completed    Lab Results  Component Value Date   WBC 6.0 05/01/2018   HGB 13.7 05/01/2018   HCT 40.1 05/01/2018   PLT 328.0 05/01/2018   GLUCOSE 178 (H) 05/01/2018   CHOL 203 (H) 05/01/2018   TRIG 160.0 (H) 05/01/2018   HDL 40.90 05/01/2018   LDLDIRECT 149.0 02/21/2017   LDLCALC 130 (H) 05/01/2018   ALT 20 05/01/2018   AST 15 05/01/2018   NA 139 05/01/2018   K 4.2 05/01/2018   CL 104 05/01/2018   CREATININE 0.95 05/01/2018   BUN 13 05/01/2018   CO2 27 05/01/2018   TSH 1.41 05/01/2018   HGBA1C 9.1 (H) 05/01/2018   MICROALBUR 1.4 02/16/2016    Lab Results  Component Value Date   TSH 1.41 05/01/2018   Lab Results  Component Value Date   WBC 6.0 05/01/2018   HGB 13.7 05/01/2018   HCT 40.1 05/01/2018   MCV 92.2 05/01/2018   PLT 328.0 05/01/2018   Lab Results  Component Value Date   NA 139 05/01/2018   K 4.2 05/01/2018   CO2 27 05/01/2018   GLUCOSE 178 (H) 05/01/2018   BUN 13 05/01/2018   CREATININE 0.95 05/01/2018   BILITOT 0.5 05/01/2018   ALKPHOS 51 05/01/2018  AST 15 05/01/2018   ALT 20 05/01/2018   PROT 7.0 05/01/2018   ALBUMIN 4.1 05/01/2018   CALCIUM 9.6 05/01/2018   ANIONGAP 10 03/22/2015   GFR 80.78 05/01/2018   Lab Results  Component Value Date   CHOL 203 (H) 05/01/2018   Lab Results  Component Value Date   HDL 40.90 05/01/2018   Lab Results  Component Value Date   LDLCALC 130 (H)  05/01/2018   Lab Results  Component Value Date   TRIG 160.0 (H) 05/01/2018   Lab Results  Component Value Date   CHOLHDL 5 05/01/2018   Lab Results  Component Value Date   HGBA1C 9.1 (H) 05/01/2018         Assessment & Plan:   Problem List Items Addressed This Visit    Hyperlipidemia, mixed    Encouraged heart healthy diet, increase exercise, avoid trans fats, consider a krill oil cap daily. Add Vytorin 10/10 daily      Relevant Medications   ezetimibe-simvastatin (VYTORIN) 10-10 MG tablet   Other Relevant Orders   Lipid panel   Diabetes mellitus type 2 in obese ([facility])    hgba1c acceptable, minimize simple carbs. Increase exercise as tolerated. Continue current meds. Referred to nutritionist for futher education.       Relevant Medications   metFORMIN (GLUCOPHAGE-XR) 500 MG 24 hr tablet   Other Relevant Orders   Amb ref to Medical Nutrition Therapy-MNT   Hemoglobin A1c   Essential hypertension    Well controlled, no changes to meds. Encouraged heart healthy diet such as the DASH diet and exercise as tolerated.       Relevant Medications   ezetimibe-simvastatin (VYTORIN) 10-10 MG tablet   Other Relevant Orders   CBC   Comprehensive metabolic panel   TSH   Morbid obesity ([place])    Encouraged DASH diet, decrease po intake and increase exercise as tolerated. Needs 7-8 hours of sleep nightly. Avoid trans fats, eat small, frequent meals every 4-5 hours with lean proteins, complex carbs and healthy fats. Minimize simple carbs, referred to healthy weight and wellness and nutritionist.       Relevant Medications   metFORMIN (GLUCOPHAGE-XR) 500 MG 24 hr tablet   Vitamin D deficiency    Supplement and monitor      Relevant Orders   VITAMIN D 25 Hydroxy (Vit-D Deficiency, Fractures)   Preventative health care    Patient encouraged to maintain heart healthy diet, regular exercise, adequate sleep. Consider daily probiotics. Take medications as prescribed. Referred to GYN  for pap, MGM ordered, flu shot given.       Colon polyps - Primary   Relevant Orders   Ambulatory referral to Gastroenterology   Hip pain, chronic, left    Encouraged moist heat and gentle stretching as tolerated. May try NSAIDs and prescription meds as directed and report if symptoms worsen or seek immediate care. Consider chiropractic care and massage therapy      Relevant Orders   DG HIPS BILAT W OR W/O PELVIS 2V    Other Visit Diagnoses    Obesity, unspecified classification, unspecified obesity type, unspecified whether serious comorbidity present       Relevant Medications   metFORMIN (GLUCOPHAGE-XR) 500 MG 24 hr tablet   Other Relevant Orders   Amb Ref to Medical Weight Management   Amb ref to Medical Nutrition Therapy-MNT   Breast cancer screening       Relevant Orders   MM 3D SCREEN BREAST BILATERAL   Cervical cancer  screening       Relevant Orders   Ambulatory referral to Obstetrics / Gynecology   Needs flu shot       Relevant Orders   Flu Vaccine QUAD 6+ mos PF IM (Fluarix Quad PF) (Completed)      I have discontinued Tersa D. Vandervelden's Naltrexone-buPROPion HCl ER, zolpidem, glimepiride, and naproxen. I am also having her start on ezetimibe-simvastatin. Additionally, I am having her maintain her freestyle, glucose monitoring kit, hydrochlorothiazide, hyoscyamine, metoprolol succinate, azelastine, Vitamin D (Ergocalciferol), and metFORMIN.  Meds ordered this encounter  Medications  . DISCONTD: metFORMIN (GLUCOPHAGE-XR) 500 MG 24 hr tablet    Sig: Take 1 tablet (500 mg total) by mouth daily with breakfast.    Dispense:  90 tablet    Refill:  3    Please consider 90 day supplies to promote better adherence  . Vitamin D, Ergocalciferol, (DRISDOL) 50000 units CAPS capsule    Sig: TAKE 1 CAPSULE BY MOUTH ONCE A WEEK    Dispense:  12 capsule    Refill:  1    Please consider 90 day supplies to promote better adherence  . ezetimibe-simvastatin (VYTORIN) 10-10 MG  tablet    Sig: Take 1 tablet by mouth at bedtime.    Dispense:  30 tablet    Refill:  5  . metFORMIN (GLUCOPHAGE-XR) 500 MG 24 hr tablet    Sig: Take 1 tablet (500 mg total) by mouth daily with breakfast.    Dispense:  180 tablet    Refill:  1    Please consider 90 day supplies to promote better adherence    CMA served as scribe during this visit. History, Physical and Plan performed by medical provider. Documentation and orders reviewed and attested to.  Penni Homans, MD

## 2018-05-06 NOTE — Assessment & Plan Note (Signed)
Supplement and monitor 

## 2018-05-06 NOTE — Patient Instructions (Addendum)
NOW company supplements at [email or web address]   Carbohydrate Counting for Diabetes Mellitus, Adult Carbohydrate counting is a method for keeping track of how many carbohydrates you eat. Eating carbohydrates naturally increases the amount of sugar (glucose) in the blood. Counting how many carbohydrates you eat helps keep your blood glucose within normal limits, which helps you manage your diabetes (diabetes mellitus). It is important to know how many carbohydrates you can safely have in each meal. This is different for every person. A diet and nutrition specialist (registered dietitian) can help you make a meal plan and calculate how many carbohydrates you should have at each meal and snack. Carbohydrates are found in the following foods:  Grains, such as breads and cereals.  Dried beans and soy products.  Starchy vegetables, such as potatoes, peas, and corn.  Fruit and fruit juices.  Milk and yogurt.  Sweets and snack foods, such as cake, cookies, candy, chips, and soft drinks.  How do I count carbohydrates? There are two ways to count carbohydrates in food. You can use either of the methods or a combination of both. Reading "Nutrition Facts" on packaged food The "Nutrition Facts" list is included on the labels of almost all packaged foods and beverages in the [place] It includes:  The serving size.  Information about nutrients in each serving, including the grams (g) of carbohydrate per serving.  To use the "Nutrition Facts":  Decide how many servings you will have.  Multiply the number of servings by the number of carbohydrates per serving.  The resulting number is the total amount of carbohydrates that you will be having.  Learning standard serving sizes of other foods When you eat foods containing carbohydrates that are not packaged or do not include "Nutrition Facts" on the label, you need to measure the servings in order to count the amount of carbohydrates:  Measure the  foods that you will eat with a food scale or measuring cup, if needed.  Decide how many standard-size servings you will eat.  Multiply the number of servings by 15. Most carbohydrate-rich foods have about 15 g of carbohydrates per serving. ? For example, if you eat 8 oz (170 g) of strawberries, you will have eaten 2 servings and 30 g of carbohydrates (2 servings x 15 g = 30 g).  For foods that have more than one food mixed, such as soups and casseroles, you must count the carbohydrates in each food that is included.  The following list contains standard serving sizes of common carbohydrate-rich foods. Each of these servings has about 15 g of carbohydrates:   hamburger bun or  English muffin.   oz (15 mL) syrup.   oz (14 g) jelly.  1 slice of bread.  1 six-inch tortilla.  3 oz (85 g) cooked rice or pasta.  4 oz (113 g) cooked dried beans.  4 oz (113 g) starchy vegetable, such as peas, corn, or potatoes.  4 oz (113 g) hot cereal.  4 oz (113 g) mashed potatoes or  of a large baked potato.  4 oz (113 g) canned or frozen fruit.  4 oz (120 mL) fruit juice.  4-6 crackers.  6 chicken nuggets.  6 oz (170 g) unsweetened dry cereal.  6 oz (170 g) plain fat-free yogurt or yogurt sweetened with artificial sweeteners.  8 oz (240 mL) milk.  8 oz (170 g) fresh fruit or one small piece of fruit.  24 oz (680 g) popped popcorn.  Example of carbohydrate counting Sample meal  3 oz (85 g) chicken breast.  6 oz (170 g) brown rice.  4 oz (113 g) corn.  8 oz (240 mL) milk.  8 oz (170 g) strawberries with sugar-free whipped topping. Carbohydrate calculation 1. Identify the foods that contain carbohydrates: ? Rice. ? Corn. ? Milk. ? Strawberries. 2. Calculate how many servings you have of each food: ? 2 servings rice. ? 1 serving corn. ? 1 serving milk. ? 1 serving strawberries. 3. Multiply each number of servings by 15 g: ? 2 servings rice x 15 g = 30 g. ? 1  serving corn x 15 g = 15 g. ? 1 serving milk x 15 g = 15 g. ? 1 serving strawberries x 15 g = 15 g. 4. Add together all of the amounts to find the total grams of carbohydrates eaten: ? 30 g + 15 g + 15 g + 15 g = 75 g of carbohydrates total. This information is not intended to replace advice given to you by your health care provider. Make sure you discuss any questions you have with your health care provider. Document Released: 09/03/2005 Document Revised: 03/23/2016 Document Reviewed: 02/15/2016 Elsevier Interactive Patient Education  2018 Chelsea Bray, Female Preventive care refers to lifestyle choices and visits with your health care provider that can promote health and wellness. What does preventive care include?  A yearly physical exam. This is also called an annual well check.  Dental exams once or twice a year.  Routine eye exams. Ask your health care provider how often you should have your eyes checked.  Personal lifestyle choices, including: ? Daily care of your teeth and gums. ? Regular physical activity. ? Eating a healthy diet. ? Avoiding tobacco and drug use. ? Limiting alcohol use. ? Practicing safe sex. ? Taking low-dose aspirin daily starting at age 39. ? Taking vitamin and mineral supplements as recommended by your health care provider. What happens during an annual well check? The services and screenings done by your health care provider during your annual well check will depend on your age, overall health, lifestyle risk factors, and family history of disease. Counseling Your health care provider may ask you questions about your:  Alcohol use.  Tobacco use.  Drug use.  Emotional well-being.  Home and relationship well-being.  Sexual activity.  Eating habits.  Work and work Statistician.  Method of birth control.  Menstrual cycle.  Pregnancy history.  Screening You may have the following tests or  measurements:  Height, weight, and BMI.  Blood pressure.  Lipid and cholesterol levels. These may be checked every 5 Bray, or more frequently if you are over 81 Bray old.  Skin check.  Lung cancer screening. You may have this screening every year starting at age 36 if you have a 30-pack-year history of smoking and currently smoke or have quit within the past 15 Bray.  Fecal occult blood test (FOBT) of the stool. You may have this test every year starting at age 21.  Flexible sigmoidoscopy or colonoscopy. You may have a sigmoidoscopy every 5 Bray or a colonoscopy every 10 Bray starting at age 36.  Hepatitis C blood test.  Hepatitis B blood test.  Sexually transmitted disease (STD) testing.  Diabetes screening. This is done by checking your blood sugar (glucose) after you have not eaten for a while (fasting). You may have this done every 1-3 Bray.  Mammogram. This may be done every 1-2 Bray. Talk to your health care provider  about when you should start having regular mammograms. This may depend on whether you have a family history of breast cancer.  BRCA-related cancer screening. This may be done if you have a family history of breast, ovarian, tubal, or peritoneal cancers.  Pelvic exam and Pap test. This may be done every 3 Bray starting at age 21. Starting at age 73, this may be done every 5 Bray if you have a Pap test in combination with an HPV test.  Bone density scan. This is done to screen for osteoporosis. You may have this scan if you are at high risk for osteoporosis.  Discuss your test results, treatment options, and if necessary, the need for more tests with your health care provider. Vaccines Your health care provider may recommend certain vaccines, such as:  Influenza vaccine. This is recommended every year.  Tetanus, diphtheria, and acellular pertussis (Tdap, Td) vaccine. You may need a Td booster every 10 Bray.  Varicella vaccine. You may need this if  you have not been vaccinated.  Zoster vaccine. You may need this after age 71.  Measles, mumps, and rubella (MMR) vaccine. You may need at least one dose of MMR if you were born in 1957 or later. You may also need a second dose.  Pneumococcal 13-valent conjugate (PCV13) vaccine. You may need this if you have certain conditions and were not previously vaccinated.  Pneumococcal polysaccharide (PPSV23) vaccine. You may need one or two doses if you smoke cigarettes or if you have certain conditions.  Meningococcal vaccine. You may need this if you have certain conditions.  Hepatitis A vaccine. You may need this if you have certain conditions or if you travel or work in places where you may be exposed to hepatitis A.  Hepatitis B vaccine. You may need this if you have certain conditions or if you travel or work in places where you may be exposed to hepatitis B.  Haemophilus influenzae type b (Hib) vaccine. You may need this if you have certain conditions.  Talk to your health care provider about which screenings and vaccines you need and how often you need them. This information is not intended to replace advice given to you by your health care provider. Make sure you discuss any questions you have with your health care provider. Document Released: 09/30/2015 Document Revised: 05/23/2016 Document Reviewed: 07/05/2015 Elsevier Interactive Patient Education  [employer].

## 2018-05-06 NOTE — Assessment & Plan Note (Signed)
Encouraged heart healthy diet, increase exercise, avoid trans fats, consider a krill oil cap daily. Add Vytorin 10/10 daily

## 2018-05-06 NOTE — Assessment & Plan Note (Addendum)
Patient encouraged to maintain heart healthy diet, regular exercise, adequate sleep. Consider daily probiotics. Take medications as prescribed. Referred to GYN for pap, MGM ordered, flu shot given.

## 2018-05-07 ENCOUNTER — Telehealth: Payer: Self-pay | Admitting: Internal Medicine

## 2018-05-07 NOTE — Telephone Encounter (Signed)
Dr. Marina GoodellPerry Doc of the Day for 05/06/18 pm. Records printed from Epic and placed on his desk for review.

## 2018-05-11 DIAGNOSIS — G8929 Other chronic pain: Secondary | ICD-10-CM | POA: Insufficient documentation

## 2018-05-11 DIAGNOSIS — M25552 Pain in left hip: Secondary | ICD-10-CM

## 2018-05-11 NOTE — Assessment & Plan Note (Signed)
hgba1c acceptable, minimize simple carbs. Increase exercise as tolerated. Continue current meds. Referred to nutritionist for futher education.

## 2018-05-11 NOTE — Assessment & Plan Note (Signed)
Encouraged moist heat and gentle stretching as tolerated. May try NSAIDs and prescription meds as directed and report if symptoms worsen or seek immediate care. Consider chiropractic care and massage therapy

## 2018-05-11 NOTE — Assessment & Plan Note (Signed)
Encouraged DASH diet, decrease po intake and increase exercise as tolerated. Needs 7-8 hours of sleep nightly. Avoid trans fats, eat small, frequent meals every 4-5 hours with lean proteins, complex carbs and healthy fats. Minimize simple carbs, referred to healthy weight and wellness and nutritionist.

## 2018-05-11 NOTE — Assessment & Plan Note (Signed)
Well controlled, no changes to meds. Encouraged heart healthy diet such as the DASH diet and exercise as tolerated.  °

## 2018-05-14 ENCOUNTER — Encounter (HOSPITAL_BASED_OUTPATIENT_CLINIC_OR_DEPARTMENT_OTHER): Payer: Self-pay

## 2018-05-14 ENCOUNTER — Ambulatory Visit (HOSPITAL_BASED_OUTPATIENT_CLINIC_OR_DEPARTMENT_OTHER)
Admission: RE | Admit: 2018-05-14 | Discharge: 2018-05-14 | Disposition: A | Discharge status: Home / Self Care | Payer: BC Managed Care – PPO | Source: Ambulatory Visit | Attending: Family Medicine | Admitting: Family Medicine

## 2018-05-14 DIAGNOSIS — Z1231 Encounter for screening mammogram for malignant neoplasm of breast: Secondary | ICD-10-CM | POA: Insufficient documentation

## 2018-05-14 DIAGNOSIS — Z1239 Encounter for other screening for malignant neoplasm of breast: Secondary | ICD-10-CM

## 2018-05-14 LAB — HM DIABETES EYE EXAM

## 2018-05-14 NOTE — Telephone Encounter (Signed)
Left message for patient to return my call.

## 2018-05-14 NOTE — Telephone Encounter (Signed)
Outside records reviewed. Hyperplastic polyp in 2012 on flexible sigmoidoscopy. Colonoscopy performed, by Dr. Noe GensPeters, 09/02/2012 was said to have inadequate preparation for which follow-up in 2 years recommended.  Based on this information, okay to set up for colonoscopy in [facility] with me.  Thank you

## 2018-05-15 ENCOUNTER — Encounter: Payer: Self-pay | Admitting: Internal Medicine

## 2018-05-20 ENCOUNTER — Telehealth: Payer: Self-pay | Admitting: Family Medicine

## 2018-05-20 NOTE — Telephone Encounter (Signed)
Copied from CRM [id number]. Topic: General - Other >> May 20, 2018  4:04 PM Stephannie Li, NT wrote: Reason for CRM: Patient called and said the pharmacy will not fill the prescription for metformin until 05/24/18 .due to insurance authorization , she says she is was advised to  to take 2 a day per her visit on 05/06/18  with Dr Abner Greenspan and this is not on the prescription , please call her at [phone] to discuss

## 2018-05-29 ENCOUNTER — Encounter: Payer: BC Managed Care – PPO | Admitting: Family Medicine

## 2018-06-05 ENCOUNTER — Ambulatory Visit (HOSPITAL_BASED_OUTPATIENT_CLINIC_OR_DEPARTMENT_OTHER)
Admission: RE | Admit: 2018-06-05 | Discharge: 2018-06-05 | Disposition: A | Discharge status: Home / Self Care | Payer: BC Managed Care – PPO | Source: Ambulatory Visit | Attending: Family Medicine | Admitting: Family Medicine

## 2018-06-05 ENCOUNTER — Encounter: Payer: Self-pay | Admitting: Family Medicine

## 2018-06-05 DIAGNOSIS — M778 Other enthesopathies, not elsewhere classified: Secondary | ICD-10-CM | POA: Diagnosis not present

## 2018-06-05 DIAGNOSIS — G8929 Other chronic pain: Secondary | ICD-10-CM

## 2018-06-05 DIAGNOSIS — M25552 Pain in left hip: Secondary | ICD-10-CM | POA: Diagnosis present

## 2018-06-05 DIAGNOSIS — M25551 Pain in right hip: Secondary | ICD-10-CM | POA: Diagnosis not present

## 2018-06-05 DIAGNOSIS — M545 Low back pain: Secondary | ICD-10-CM | POA: Insufficient documentation

## 2018-06-06 ENCOUNTER — Encounter: Payer: BC Managed Care – PPO | Attending: Family Medicine | Admitting: Registered"

## 2018-06-06 ENCOUNTER — Encounter: Payer: Self-pay | Admitting: Registered"

## 2018-06-06 DIAGNOSIS — Z713 Dietary counseling and surveillance: Secondary | ICD-10-CM | POA: Insufficient documentation

## 2018-06-06 DIAGNOSIS — E669 Obesity, unspecified: Secondary | ICD-10-CM | POA: Diagnosis not present

## 2018-06-06 DIAGNOSIS — E1169 Type 2 diabetes mellitus with other specified complication: Secondary | ICD-10-CM | POA: Insufficient documentation

## 2018-06-06 DIAGNOSIS — E119 Type 2 diabetes mellitus without complications: Secondary | ICD-10-CM

## 2018-06-06 NOTE — Progress Notes (Signed)
Diabetes Self-Management Education  Visit Type: First/Initial  Appt. Start Time: 0805 Appt. End Time: 0935  06/06/2018  [name], identified by name and date of birth, is a 47 y.o. female with a diagnosis of Diabetes: Type 2.   ASSESSMENT Pt states she was started with glimepiride 1x day and after see a BG reading of 314 mg/dL pt went back to MD who increased to 4x day for awhile but then switched to just metformin. Pt states after that she started regular SMBG and reports FBG has been 110-132 since she has been taking the meformin. Pt states when her BG was in the 300s she felt exhausted and noticed dry, itchy skin. Pt states energy level is much better now.   Metformin: Pt states after her last MD visit she remembered being told to increase her metformin to 2x day and did that for 2 weeks, but since the prescription wasn't changed wasn't able to get a refill for increased dose. Pt states she contacted her doctor's office but has not heard back. Pt reports taking 500 mg/day and Pt reported SMBG appears to be controlled with 500 mg dose. RD advised that if her SMBG is out of target range for an A1c of 7% to contact her doctor's office to see about the increased dose which she thought was supposed to be prescribed last month.  Pt stated dietary changes: cut out sweet tea, potato chips, less potatoes and mac and cheese. Pt states appetite is reduced, has cut out a lot of red meat, replaced hamburger with [place] burger.  Sleep: pt reports nightly use of CPAP and feels she is getting adequate sleep with 7-6 hrs of sleep.   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 Pt states she recently decided to use start using the gym and pool at hotels.  Pt states she is lactose intolerant. Pt states since having gall bladder removed she has experienced some stomach issues, but has  resolved since she started taking culturelle probiotics.   Pt states she is feeling much better with medication and dietary changes with only complains are pain in hands, L thigh pain (had an X-ray yesterday) reports her leg hurts when lays on that side, pain is worse with extended period of sitting. Bread makes sleepy when traveling  Diabetes Self-Management Education - 06/06/18 0814      Visit Information   Visit Type  First/Initial      Initial Visit   Diabetes Type  Type 2    Are you currently following a meal plan?  No    Are you taking your medications as prescribed?  Yes   500 mg Metform   Date Diagnosed  2017      Health Coping   How would you rate your overall health?  Fair      Psychosocial Assessment   Patient Belief/Attitude about Diabetes  Motivated to manage diabetes    How often do you need to have someone help you when you read instructions, pamphlets, or other written materials from your doctor or pharmacy?  1 - Never    What is the last grade level you completed in school?  1 yr college      Complications   Last HgB A1C per patient/outside source  9.1 %    How often do you check your blood sugar?  1-2 times/day  Fasting Blood glucose range (mg/dL)  70-129   110-132   Number of hypoglycemic episodes per month  0    Number of hyperglycemic episodes per week  0    Have you had a dilated eye exam in the past 12 months?  Yes    Have you had a dental exam in the past 12 months?  No    Are you checking your feet?  Yes    How many days per week are you checking your feet?  7      Dietary Intake   Breakfast  eggs, bacon OR granola bar OR naked juice    Snack (morning)  broccoli, carrots, blueberries, apples, cherries OR popcorn    Lunch  tuna or chicken salad OR subway rotisserie salad or wrap     Snack (afternoon)  none OR PB with fruit or crackers    Dinner  meat, beans, vegetables -likes mashed potatoes mac & cheese but doesn't starch eat with many meals     Snack (evening)  none    Beverage(s)  water, naked juice, body armor, unsweet tea with some lemonade,       Exercise   Exercise Type  Light (walking / raking leaves)    How many days per week to you exercise?  1    How many minutes per day do you exercise?  30    Total minutes per week of exercise  30      Patient Education   Previous Diabetes Education  No    Nutrition management   Role of diet in the treatment of diabetes and the relationship between the three main macronutrients and blood glucose level;Food label reading, portion sizes and measuring food.    Physical activity and exercise   Role of exercise on diabetes management, blood pressure control and cardiac health.    Medications  Reviewed patients medication for diabetes, action, purpose, timing of dose and side effects.    Monitoring  Purpose and frequency of SMBG.      Individualized Goals (developed by patient)   Nutrition  General guidelines for healthy choices and portions discussed    Physical Activity  Exercise 3-5 times per week    Medications  take my medication as prescribed    Monitoring   test my blood glucose as discussed    Reducing Risk  increase portions of nuts and seeds      Outcomes   Expected Outcomes  Demonstrated interest in learning. Expect positive outcomes    Future DMSE  PRN    Program Status  Completed      Individualized Plan for Diabetes Self-Management Training:   Learning Objective:  Patient will have a greater understanding of diabetes self-management. Patient education plan is to attend individual and/or group sessions per assessed needs and concerns.   Patient Instructions  Aim to get in exercise 3-5 times per week, 30-45 min. Aim to eat balanced meals and snacks. Fairlife milk may be a high protein, lactose-free option for your cereal. Getting more omega 3 may help reduce inflammation and cholesterol. Continue to eat seafood, consider adding more pistachios (all nuts are good),  oatmeal Consider checking your blood sugar fasting and 2 hrs after some meals. Look for MyChart message for link to diabetes book on-line.   Expected Outcomes:  Demonstrated interest in learning. Expect positive outcomes  Education material provided: ADA Diabetes: Your Take Control Guide  If problems or questions, patient to contact team via:  Phone and  MyChart  Future DSME appointment: PRN

## 2018-06-06 NOTE — Patient Instructions (Addendum)
Aim to get in exercise 3-5 times per week, 30-45 min. Aim to eat balanced meals and snacks. Fairlife milk may be a high protein, lactose-free option for your cereal. Getting more omega 3 may help reduce inflammation and cholesterol. Continue to eat seafood, consider adding more pistachios (all nuts are good), oatmeal Consider checking your blood sugar fasting and 2 hrs after some meals. Look for MyChart message for link to diabetes book on-line.

## 2018-07-10 ENCOUNTER — Encounter (INDEPENDENT_AMBULATORY_CARE_PROVIDER_SITE_OTHER): Payer: Self-pay

## 2018-07-16 ENCOUNTER — Encounter: Payer: BC Managed Care – PPO | Admitting: Internal Medicine

## 2018-07-22 DIAGNOSIS — Z0289 Encounter for other administrative examinations: Secondary | ICD-10-CM

## 2018-07-23 ENCOUNTER — Ambulatory Visit (INDEPENDENT_AMBULATORY_CARE_PROVIDER_SITE_OTHER): Payer: PRIVATE HEALTH INSURANCE | Admitting: Family Medicine

## 2018-07-23 ENCOUNTER — Encounter (INDEPENDENT_AMBULATORY_CARE_PROVIDER_SITE_OTHER): Payer: Self-pay

## 2018-07-30 ENCOUNTER — Other Ambulatory Visit: Payer: Self-pay | Admitting: Family Medicine

## 2018-08-04 ENCOUNTER — Other Ambulatory Visit: Payer: BC Managed Care – PPO

## 2018-08-05 ENCOUNTER — Other Ambulatory Visit: Payer: BC Managed Care – PPO

## 2018-08-07 ENCOUNTER — Encounter (INDEPENDENT_AMBULATORY_CARE_PROVIDER_SITE_OTHER): Payer: Self-pay | Admitting: Bariatrics

## 2018-08-07 ENCOUNTER — Ambulatory Visit (INDEPENDENT_AMBULATORY_CARE_PROVIDER_SITE_OTHER): Payer: PRIVATE HEALTH INSURANCE | Admitting: Bariatrics

## 2018-08-07 VITALS — BP 135/92 | HR 82 | Temp 98.0°F | Ht 65.0 in | Wt 236.0 lb

## 2018-08-07 DIAGNOSIS — Z6839 Body mass index (BMI) 39.0-39.9, adult: Secondary | ICD-10-CM

## 2018-08-07 DIAGNOSIS — R0602 Shortness of breath: Secondary | ICD-10-CM

## 2018-08-07 DIAGNOSIS — Z1331 Encounter for screening for depression: Secondary | ICD-10-CM | POA: Diagnosis not present

## 2018-08-07 DIAGNOSIS — E7849 Other hyperlipidemia: Secondary | ICD-10-CM

## 2018-08-07 DIAGNOSIS — E1165 Type 2 diabetes mellitus with hyperglycemia: Secondary | ICD-10-CM | POA: Diagnosis not present

## 2018-08-07 DIAGNOSIS — Z9189 Other specified personal risk factors, not elsewhere classified: Secondary | ICD-10-CM | POA: Diagnosis not present

## 2018-08-07 DIAGNOSIS — R5383 Other fatigue: Secondary | ICD-10-CM | POA: Diagnosis not present

## 2018-08-07 DIAGNOSIS — E559 Vitamin D deficiency, unspecified: Secondary | ICD-10-CM

## 2018-08-07 DIAGNOSIS — I10 Essential (primary) hypertension: Secondary | ICD-10-CM

## 2018-08-08 ENCOUNTER — Ambulatory Visit: Payer: BC Managed Care – PPO | Admitting: Family Medicine

## 2018-08-08 LAB — COMPREHENSIVE METABOLIC PANEL
ALBUMIN: 4.1 g/dL (ref 3.5–5.5)
ALT: 21 IU/L (ref 0–32)
AST: 16 IU/L (ref 0–40)
Albumin/Globulin Ratio: 1.6 (ref 1.2–2.2)
Alkaline Phosphatase: 64 IU/L (ref 39–117)
BILIRUBIN TOTAL: 0.2 mg/dL (ref 0.0–1.2)
BUN/Creatinine Ratio: 11 (ref 9–23)
BUN: 10 mg/dL (ref 6–24)
CO2: 22 mmol/L (ref 20–29)
CREATININE: 0.9 mg/dL (ref 0.57–1.00)
Calcium: 9.3 mg/dL (ref 8.7–10.2)
Chloride: 104 mmol/L (ref 96–106)
GFR calc non Af Amer: 76 mL/min/{1.73_m2} (ref >59)
GFR, EST AFRICAN AMERICAN: 87 mL/min/{1.73_m2} (ref >59)
GLOBULIN, TOTAL: 2.5 g/dL (ref 1.5–4.5)
GLUCOSE: 166 mg/dL — AB (ref 65–99)
Potassium: 4.4 mmol/L (ref 3.5–5.2)
SODIUM: 140 mmol/L (ref 134–144)
TOTAL PROTEIN: 6.6 g/dL (ref 6.0–8.5)

## 2018-08-08 LAB — HEMOGLOBIN A1C
Est. average glucose Bld gHb Est-mCnc: 200 mg/dL
Hgb A1c MFr Bld: 8.6 % — ABNORMAL HIGH (ref 4.8–5.6)

## 2018-08-08 LAB — LIPID PANEL WITH LDL/HDL RATIO
CHOLESTEROL TOTAL: 185 mg/dL (ref 100–199)
HDL: 39 mg/dL — ABNORMAL LOW (ref >39)
LDL CALC: 119 mg/dL — AB (ref 0–99)
LDl/HDL Ratio: 3.1 ratio (ref 0.0–3.2)
TRIGLYCERIDES: 134 mg/dL (ref 0–149)
VLDL CHOLESTEROL CAL: 27 mg/dL (ref 5–40)

## 2018-08-08 LAB — CBC WITH DIFFERENTIAL
BASOS: 1 %
Basophils Absolute: 0 10*3/uL (ref 0.0–0.2)
EOS (ABSOLUTE): 0.1 10*3/uL (ref 0.0–0.4)
Eos: 1 %
Hematocrit: 38 % (ref 34.0–46.6)
Hemoglobin: 12.8 g/dL (ref 11.1–15.9)
Immature Grans (Abs): 0 10*3/uL (ref 0.0–0.1)
Immature Granulocytes: 0 %
LYMPHS ABS: 1.6 10*3/uL (ref 0.7–3.1)
Lymphs: 24 %
MCH: 30.8 pg (ref 26.6–33.0)
MCHC: 33.7 g/dL (ref 31.5–35.7)
MCV: 91 fL (ref 79–97)
MONOS ABS: 0.4 10*3/uL (ref 0.1–0.9)
Monocytes: 6 %
NEUTROS ABS: 4.7 10*3/uL (ref 1.4–7.0)
NEUTROS PCT: 68 %
RBC: 4.16 x10E6/uL (ref 3.77–5.28)
RDW: 13.6 % (ref 12.3–15.4)
WBC: 6.8 10*3/uL (ref 3.4–10.8)

## 2018-08-08 LAB — INSULIN, RANDOM: INSULIN: 35.6 u[IU]/mL — ABNORMAL HIGH (ref 2.6–24.9)

## 2018-08-08 LAB — VITAMIN D 25 HYDROXY (VIT D DEFICIENCY, FRACTURES): Vit D, 25-Hydroxy: 41.8 ng/mL (ref 30.0–100.0)

## 2018-08-08 LAB — VITAMIN B12: Vitamin B-12: 587 pg/mL (ref 232–1245)

## 2018-08-08 LAB — FOLATE: Folate: >20 ng/mL (ref >3.0)

## 2018-08-12 ENCOUNTER — Encounter (INDEPENDENT_AMBULATORY_CARE_PROVIDER_SITE_OTHER): Payer: Self-pay | Admitting: Bariatrics

## 2018-08-12 DIAGNOSIS — E1165 Type 2 diabetes mellitus with hyperglycemia: Secondary | ICD-10-CM | POA: Insufficient documentation

## 2018-08-12 NOTE — Progress Notes (Signed)
Office: [phone]  /  Fax: [phone]   Dear Dr. Charlett Bray,   Thank you for referring Chelsea Bray to our clinic. The following note includes my evaluation and treatment recommendations.  HPI:   Chief Complaint: OBESITY    Chelsea Bray has been referred by Chelsea Levine A. Charlett Blake, MD for consultation regarding her obesity and obesity related comorbidities.    Chelsea Bray (MR# [id number]) is a 47 y.o. female who presents on 08/12/2018 for obesity evaluation and treatment. Current BMI is Body mass index is 39.27 kg/m.Chelsea Bray has been struggling with her weight for many years and has been unsuccessful in either losing weight, maintaining weight loss, or reaching her healthy weight goal.     Chelsea Bray attended our information session and states she is currently in the action stage of change and ready to dedicate time achieving and maintaining a healthier weight. Chelsea Bray is interested in becoming our patient and working on intensive lifestyle modifications including (but not limited to) diet, exercise and weight loss.    Chelsea Bray states her family eats meals together she thinks her family will eat healthier with  her her desired weight loss is 81 lbs she started gaining weight after childbirth her heaviest weight ever was 240 lbs. she has significant food cravings issues  she considers her worst food habits to be sweets and sodas she states that she cannot skip meals she is frequently drinking liquids with calories she frequently makes poor food choices she has binge eating behaviors she struggles with emotional eating    Fatigue Chelsea Bray feels her energy is lower than it should be. This has worsened with weight gain and has not worsened recently. Chelsea Bray admits to daytime somnolence and she admits to waking up still tired. Patient has a history of obstructive sleep apnea which may contribute to her fatigue. Chelsea Bray is wearing her CPAP nightly and she is still  not rested. Chelsea Bray has a history of symptoms of daytime fatigue, morning fatigue and hypertension. Patient generally gets 5 or 6 hours of sleep per night, and states they generally have restless sleep. Snoring is present. Apneic episodes are present. Epworth Sleepiness Score is 17  Dyspnea on exertion Chelsea Bray notes increasing shortness of breath with exercising and seems to be worsening over time with weight gain. She notes getting out of breath sooner with activity than she used to. This has not gotten worse recently. Chelsea Bray denies orthopnea.  Hypertension Chelsea Bray is a 47 y.o. female with hypertension. Her blood pressure is elevated today at 135/92 and she did not take her medications yesterday. She is taking HCTZ and Metoprolol. She has increased pain (cramping). Chelsea Bray denies chest pain or headache. She is working weight loss to help control her blood pressure with the goal of decreasing her risk of heart attack and stroke. Chelsea Bray blood pressure is not well controlled.  Hyperlipidemia Chelsea Bray has hyperlipidemia and she is on Vytorin (will call with dose). She was started on Vytorin at her last visit with her PCP 06/06/2018. She is attempting to improve her cholesterol levels with intensive lifestyle modification including a low saturated fat diet, exercise and weight loss. She denies myalgias.  Diabetes II uncontrolled Chelsea Bray has a diagnosis of diabetes type II. Patient recall of fasting BGs range between 120 and 130's and she denies any hypoglycemic episodes. Last A1c was at 9.1 She has been working on intensive lifestyle modifications including diet, exercise, and weight loss to help control her blood glucose levels.  At  risk for cardiovascular disease Chelsea Bray is at a higher than average risk for cardiovascular disease due to obesity, hypertension, hyperlipidemia and diabetes. She currently denies any chest pain.  Vitamin D deficiency Chelsea Bray has a  diagnosis of vitamin D deficiency. She is currently taking OTC vit D and denies nausea, vomiting or muscle weakness.  Depression Screen Chelsea Bray's Food and Mood (modified PHQ-9) score was  Depression screen PHQ 2/9 08/07/2018  Decreased Interest 2  Down, Depressed, Hopeless 1  PHQ - 2 Score 3  Altered sleeping 3  Tired, decreased energy 3  Change in appetite 1  Feeling bad or failure about yourself  1  Trouble concentrating 2  Moving slowly or fidgety/restless 0  Suicidal thoughts 0  PHQ-9 Score 13  Difficult doing work/chores Very difficult    ALLERGIES: No Known Allergies  MEDICATIONS: Current Outpatient Medications on File Prior to Visit  Medication Sig Dispense Refill  . hydrochlorothiazide (HYDRODIURIL) 25 MG tablet Take 1 tablet (25 mg total) by mouth daily. 90 tablet 1  . hyoscyamine (LEVSIN SL) 0.125 MG SL tablet Place 1 tablet (0.125 mg total) under the tongue every 4 (four) hours as needed. 30 tablet 1  . metFORMIN (GLUCOPHAGE-XR) 500 MG 24 hr tablet Take 1 tablet (500 mg total) by mouth daily with breakfast. 180 tablet 1  . metoprolol succinate (TOPROL-XL) 50 MG 24 hr tablet TAKE 1 TABLET BY MOUTH DAILY WITH OR IMMEDIATELY FOLLOWING A MEAL 30 tablet 5  . Vitamin D, Ergocalciferol, (DRISDOL) 50000 units CAPS capsule TAKE 1 CAPSULE BY MOUTH ONCE A WEEK 12 capsule 1  . azelastine (OPTIVAR) 0.05 % ophthalmic solution Place 1 drop into both eyes 2 (two) times daily. (Patient not taking: Reported on 08/07/2018) 6 mL 1  . BIOTIN PO Take by mouth.    . ezetimibe-simvastatin (VYTORIN) 10-10 MG tablet Take 1 tablet by mouth at bedtime. (Patient not taking: Reported on 08/07/2018) 30 tablet 5  . glucose monitoring kit (FREESTYLE) monitoring kit 1 each by Does not apply route 3 (three) times a week. Or any monitor that insurance will cover  DX:790.29 (Patient not taking: Reported on 08/07/2018) 1 each 0  . Lactobacillus Rhamnosus, GG, (CULTURELLE PO) Take by mouth.    . Lancets  (FREESTYLE) lancets DX: 790.29 (Patient not taking: Reported on 08/07/2018) 100 each 0   No current facility-administered medications on file prior to visit.     PAST MEDICAL HISTORY: Past Medical History:  Diagnosis Date  . Acid reflux disease   . Arthritis   . Colon polyps 02/21/2017  . Diabetes ([place]) 11/17/2011  . Diabetes mellitus type 2 in obese ([place]) 11/17/2011  . History of esophageal stricture 02/21/2017  . Hyperlipidemia   . Hypertension   . IBS (irritable bowel syndrome)   . Insomnia 03/08/2016  . Lactose intolerance   . Morbid obesity ([place]) 01/10/2013  . Pain in joint, upper arm 11/08/2015  . Preventative health care 11/29/2013  . Sleep apnea   . Swelling of both lower extremities   . TOS (thoracic outlet syndrome) 11/20/2015  . Vaginitis and vulvovaginitis 12/23/2014  . Vitamin D deficiency     PAST SURGICAL HISTORY: Past Surgical History:  Procedure Laterality Date  . BREAST REDUCTION SURGERY  2001  . CEREBRAL MICROVASCULAR DECOMPRESSION  2006   back of her head  . CHOLECYSTECTOMY    . GALLBLADDER SURGERY  1991   gall stone removed  . MOLE REMOVAL    . REDUCTION MAMMAPLASTY Bilateral 2001    SOCIAL HISTORY:  Social History   Tobacco Use  . Smoking status: Never Smoker  . Smokeless tobacco: Never Used  Substance Use Topics  . Alcohol use: Yes    Alcohol/week: 0.0 standard drinks    Comment: wine ocassion  . Drug use: No    FAMILY HISTORY: Family History  Problem Relation Age of Onset  . Heart disease Maternal Grandmother        MI  . Stroke Maternal Grandmother   . Diabetes Maternal Grandmother   . Arrhythmia Mother   . Diabetes Mother        maternal grandmother and aunts  . Hypertension Mother   . Heart attack Father   . Stroke Father   . Hyperlipidemia Father   . Hypertension Father   . Cancer Father        lung, smoker  . Heart disease Father        CABG x 4  . Alcoholism Father   . Prostate cancer Maternal Grandfather   . Cancer Maternal  Grandfather        colon cancer, prostate  . Alcohol abuse Paternal Grandmother        died of pneumonia  . Heart disease Paternal Grandfather        MI  . Asthma Daughter        allergies  . Hypertension Unknown        parents and maternal parents  . Breast cancer Neg Hx     ROS: Review of Systems  Constitutional: Positive for malaise/fatigue.  HENT: Positive for congestion (nasal stuffiness).        + Dry Mouth  Eyes: Positive for redness.       + Wear Glasses or Contacts + Blurry or Double Vision + Floaters  Respiratory: Positive for shortness of breath (with activity).   Cardiovascular: Positive for palpitations. Negative for chest pain and orthopnea.       + Very Cold Feet or Hands   Gastrointestinal: Negative for nausea and vomiting.  Musculoskeletal: Negative for myalgias.       + Muscle or Joint Pain + Muscle Stiffness Negative for muscle weakness  Skin: Positive for itching.       + Dryness + Hair or Nail Changes  Neurological: Positive for weakness. Negative for headaches.  Endo/Heme/Allergies:       Negative for hypoglcyemia  Psychiatric/Behavioral: The patient has insomnia.     PHYSICAL EXAM: Blood pressure (!) 135/92, pulse 82, temperature 98 F (36.7 C), temperature source Oral, height '5\' 5"'$  (1.651 m), weight 236 lb (107 kg), last menstrual period 08/06/2018, SpO2 97 %. Body mass index is 39.27 kg/m. Physical Exam  Constitutional: She is oriented to person, place, and time. She appears well-developed and well-nourished.  HENT:  Head: Normocephalic and atraumatic.  Nose: Nose normal.  Mallampati = 4  Eyes: EOM are normal. No scleral icterus.  Neck: Normal range of motion. Neck supple. No thyromegaly present.  Cardiovascular: Normal rate and regular rhythm.  Pulmonary/Chest: Effort normal. No respiratory distress.  Abdominal: Soft. There is no tenderness.  + Obesity  Musculoskeletal: Normal range of motion.  Range of Motion normal in all 4  extremities  Neurological: She is alert and oriented to person, place, and time. Coordination normal.  Skin: Skin is warm and dry.  Psychiatric: She has a normal mood and affect.  Vitals reviewed.   RECENT LABS AND TESTS: BMET    Component Value Date/Time   NA 140 08/07/2018 1217   K 4.4 08/07/2018  1217   CL 104 08/07/2018 1217   CO2 22 08/07/2018 1217   GLUCOSE 166 (H) 08/07/2018 1217   GLUCOSE 178 (H) 05/01/2018 0958   BUN 10 08/07/2018 1217   CREATININE 0.90 08/07/2018 1217   CREATININE 0.78 11/23/2013 1131   CALCIUM 9.3 08/07/2018 1217   GFRNONAA 76 08/07/2018 1217   GFRAA 87 08/07/2018 1217   Lab Results  Component Value Date   HGBA1C 8.6 (H) 08/07/2018   Lab Results  Component Value Date   INSULIN 35.6 (H) 08/07/2018   CBC    Component Value Date/Time   WBC 6.8 08/07/2018 1217   WBC 6.0 05/01/2018 0958   RBC 4.16 08/07/2018 1217   RBC 4.35 05/01/2018 0958   HGB 12.8 08/07/2018 1217   HCT 38.0 08/07/2018 1217   PLT 328.0 05/01/2018 0958   MCV 91 08/07/2018 1217   MCH 30.8 08/07/2018 1217   MCH 31.6 03/22/2015 2230   MCHC 33.7 08/07/2018 1217   MCHC 34.0 05/01/2018 0958   RDW 13.6 08/07/2018 1217   LYMPHSABS 1.6 08/07/2018 1217   MONOABS 0.5 03/22/2015 2230   EOSABS 0.1 08/07/2018 1217   BASOSABS 0.0 08/07/2018 1217   Iron/TIBC/Ferritin/ %Sat No results found for: IRON, TIBC, FERRITIN, IRONPCTSAT Lipid Panel     Component Value Date/Time   CHOL 185 08/07/2018 1217   TRIG 134 08/07/2018 1217   HDL 39 (L) 08/07/2018 1217   CHOLHDL 5 05/01/2018 0958   VLDL 32.0 05/01/2018 0958   LDLCALC 119 (H) 08/07/2018 1217   LDLDIRECT 149.0 02/21/2017 1613   Hepatic Function Panel     Component Value Date/Time   PROT 6.6 08/07/2018 1217   ALBUMIN 4.1 08/07/2018 1217   AST 16 08/07/2018 1217   ALT 21 08/07/2018 1217   ALKPHOS 64 08/07/2018 1217   BILITOT 0.2 08/07/2018 1217   BILIDIR 0.1 11/23/2013 1131   IBILI 0.3 11/23/2013 1131      Component Value  Date/Time   TSH 1.41 05/01/2018 0958   TSH 1.15 02/21/2017 1613   TSH 1.95 05/30/2016 0856    ECG  shows NSR with a rate of 72 BPM INDIRECT CALORIMETER done today shows a VO2 of 281 and a REE of 1959.  Her calculated basal metabolic rate is 3474 thus her basal metabolic rate is better than expected.    ASSESSMENT AND PLAN: Other fatigue - Plan: EKG 12-Lead, Vitamin B12, CBC With Differential, Folate, Insulin, random  SOB (shortness of breath) on exertion - Plan: Vitamin B12, Folate, Insulin, random  Essential hypertension - Plan: Comprehensive metabolic panel, CBC With Differential  Type 2 diabetes mellitus without complication, without long-term current use of insulin (HCC) - Plan: Comprehensive metabolic panel, Hemoglobin A1c, CBC With Differential  Other hyperlipidemia - Plan: Lipid Panel With LDL/HDL Ratio  Vitamin D deficiency - Plan: VITAMIN D 25 Hydroxy (Vit-D Deficiency, Fractures)  Depression screening  At risk for heart disease  Class 2 severe obesity with serious comorbidity and body mass index (BMI) of 39.0 to 39.9 in adult, unspecified obesity type ([place])  PLAN: Fatigue Alexi was informed that her fatigue may be related to obesity, depression or many other causes. Labs will be ordered, and in the meanwhile Chelsea Bray has agreed to work on diet, exercise and weight loss to help with fatigue. Proper sleep hygiene was discussed including the need for 7-8 hours of quality sleep each night. Chelsea Bray will continue to wear her CPAP nightly.  Dyspnea on exertion Chelsea Bray's shortness of breath appears to be obesity  related and exercise induced. She has agreed to work on weight loss and gradually increase exercise to treat her exercise induced shortness of breath. If Ren follows our instructions and loses weight without improvement of her shortness of breath, we will plan to refer to pulmonology. We will monitor this condition regularly. Rox agrees to this  plan.  Hypertension We discussed sodium restriction, working on healthy weight loss, and a regular exercise program as the means to achieve improved blood pressure control. Chelsea Bray agreed with this plan and agreed to follow up as directed. We will continue to monitor her blood pressure as well as her progress with the above lifestyle modifications. She will continue her medications as prescribed and will watch for signs of hypotension as she continues her lifestyle modifications.  Hyperlipidemia Chelsea Bray was informed of the American Heart Association Guidelines emphasizing intensive lifestyle modifications as the first line treatment for hyperlipidemia. We discussed many lifestyle modifications today in depth, and Chelsea Bray will start to work on decreasing saturated fats such as fatty red meat, butter and many fried foods. She will also increase vegetables and lean protein in her diet and start to work on exercise and weight loss efforts. We will check lipid panel and CMP today and Chelsea Bray will continue to take lipid lowering agent as prescribed.  Diabetes II uncontrolled Chelsea Bray has been given extensive diabetes education by myself today including ideal fasting and post-prandial blood glucose readings, individual ideal Hgb A1c goals and hypoglycemia prevention. We discussed the importance of good blood sugar control to decrease the likelihood of diabetic complications such as nephropathy, neuropathy, limb loss, blindness, coronary artery disease, and death. We discussed the importance of intensive lifestyle modification including diet, exercise and weight loss as the first line treatment for diabetes. She will check fasting blood sugars and 2 hour post prandial blood sugars. Chelsea Bray agrees to continue her diabetes medications and will follow up at the agreed upon time.  Cardiovascular risk counseling Rogene was given extended (15 minutes) coronary artery disease prevention counseling today.  She is 47 y.o. female and has risk factors for heart disease including obesity, hypertension, hyperlipidemia and diabetes. We discussed intensive lifestyle modifications today with an emphasis on specific weight loss instructions and strategies. Pt was also informed of the importance of increasing exercise and decreasing saturated fats to help prevent heart disease.  Vitamin D Deficiency Lauralei was informed that low vitamin D levels contributes to fatigue and are associated with obesity, breast, and colon cancer. She will continue to take OTC vitamin D and will follow up for routine testing of vitamin D, at least 2-3 times per year. She was informed of the risk of over-replacement of vitamin D and agrees to not increase her dose unless she discusses this with [place] first. We will check vitamin D level today.  Depression Screen Rainah had a moderately positive depression screening. Depression is commonly associated with obesity and often results in emotional eating behaviors. We will monitor this closely and work on CBT to help improve the non-hunger eating patterns. Referral to Psychology may be required if no improvement is seen as she continues in our clinic.  Obesity Zykira is currently in the action stage of change and her goal is to continue with weight loss efforts. I recommend Shuree begin the structured treatment plan as follows:  She has agreed to follow the Category 3 plan Kaylla has been instructed to eventually work up to a goal of 150 minutes of combined cardio and strengthening exercise per week  for weight loss and overall health benefits. We discussed the following Behavioral Modification Strategies today: increase H2O intake, increasing lean protein intake, decreasing simple carbohydrates, increasing vegetables, more meal planning, holiday eating strategies and celebration eating strategies Alexandr will start to bring her lunch.    She was informed of the importance of  frequent follow up visits to maximize her success with intensive lifestyle modifications for her multiple health conditions. She was informed we would discuss her lab results at her next visit unless there is a critical issue that needs to be addressed sooner. Karoline agreed to keep her next visit at the agreed upon time to discuss these results.    OBESITY BEHAVIORAL INTERVENTION VISIT  Today's visit was # 1   Starting weight: 236 lbs Starting date: 08/07/2018 Today's weight : 236 lbs Today's date: 08/07/2018 Total lbs lost to date: 0   ASK: We discussed the diagnosis of obesity with Chelsea Bray today and Chelsea Bray agreed to give [place] permission to discuss obesity behavioral modification therapy today.  ASSESS: Shacoya has the diagnosis of obesity and her BMI today is 39.27 Shanikqua is in the action stage of change   ADVISE: Keonda was educated on the multiple health risks of obesity as well as the benefit of weight loss to improve her health. She was advised of the need for long term treatment and the importance of lifestyle modifications to improve her current health and to decrease her risk of future health problems.  AGREE: Multiple dietary modification options and treatment options were discussed and  Tashanti agreed to follow the recommendations documented in the above note.  ARRANGE: Macey was educated on the importance of frequent visits to treat obesity as outlined per CMS and USPSTF guidelines and agreed to schedule her next follow up appointment today.  Corey Skains, am acting as Location manager for [employer]. Owens Shark, DO  I have reviewed the above documentation for accuracy and completeness, and I agree with the above. -Jearld Lesch, DO  I have reviewed the above documentation for accuracy and completeness, and I agree with the above. -Jearld Lesch, DO

## 2018-08-19 ENCOUNTER — Ambulatory Visit (INDEPENDENT_AMBULATORY_CARE_PROVIDER_SITE_OTHER): Payer: Self-pay | Admitting: Family Medicine

## 2018-08-19 ENCOUNTER — Encounter (INDEPENDENT_AMBULATORY_CARE_PROVIDER_SITE_OTHER): Payer: Self-pay

## 2018-08-26 ENCOUNTER — Ambulatory Visit (INDEPENDENT_AMBULATORY_CARE_PROVIDER_SITE_OTHER): Payer: Self-pay | Admitting: Bariatrics

## 2018-08-28 ENCOUNTER — Encounter (INDEPENDENT_AMBULATORY_CARE_PROVIDER_SITE_OTHER): Payer: Self-pay

## 2018-08-28 ENCOUNTER — Ambulatory Visit (INDEPENDENT_AMBULATORY_CARE_PROVIDER_SITE_OTHER): Payer: PRIVATE HEALTH INSURANCE | Admitting: Bariatrics

## 2018-09-01 ENCOUNTER — Ambulatory Visit (INDEPENDENT_AMBULATORY_CARE_PROVIDER_SITE_OTHER): Payer: PRIVATE HEALTH INSURANCE | Admitting: Bariatrics

## 2018-09-01 ENCOUNTER — Encounter (INDEPENDENT_AMBULATORY_CARE_PROVIDER_SITE_OTHER): Payer: Self-pay | Admitting: Bariatrics

## 2018-09-01 VITALS — BP 124/84 | HR 76 | Temp 98.0°F | Ht 65.0 in | Wt 229.0 lb

## 2018-09-01 DIAGNOSIS — E559 Vitamin D deficiency, unspecified: Secondary | ICD-10-CM

## 2018-09-01 DIAGNOSIS — I10 Essential (primary) hypertension: Secondary | ICD-10-CM

## 2018-09-01 DIAGNOSIS — E7849 Other hyperlipidemia: Secondary | ICD-10-CM | POA: Diagnosis not present

## 2018-09-01 DIAGNOSIS — E119 Type 2 diabetes mellitus without complications: Secondary | ICD-10-CM

## 2018-09-01 DIAGNOSIS — Z6838 Body mass index (BMI) 38.0-38.9, adult: Secondary | ICD-10-CM

## 2018-09-01 NOTE — Progress Notes (Signed)
Office: [phone]  /  Fax: [phone]   HPI:   Chief Complaint: OBESITY Chelsea Bray is here to discuss her progress with her obesity treatment plan. She is on the Category 3 plan and is following her eating plan approximately 70 to 75 % of the time. She states she is walking 30 to 45 minutes 2 to 3 times per week. Chelsea Bray is doing well on the plan. She did find it hard to eat everything on the plan. Chelsea Bray is not drinking any "sweet drinks". She struggled slightly with getting everything in. Chelsea Bray did not have excessive hunger. Her weight is 229 lb (103.9 kg) today and has had a weight loss of 7 pounds over a period of 3 to 4 weeks since her last visit. She has lost 7 lbs since starting treatment with Chelsea Bray.  Diabetes II Hector has a diagnosis of diabetes type II. She is on metformin only. Talise states fasting BGs range between 120 and 140's and highest 2 hour post prandial blood sugar was 167.  Yemariam denies any hypoglycemic episodes. Last A1c was at 8.6 and fasting insulin was at 35.6. She has been working on intensive lifestyle modifications including diet, exercise, and weight loss to help control her blood glucose levels.  Hyperlipidemia Maeghan has hyperlipidemia and she is taking Vytorin. She has been trying to improve her cholesterol levels with intensive lifestyle modification including a low saturated fat diet, exercise and weight loss. She denies myalgias. Robotham is reasonably well controlled, see labs).  Hypertension KIMMORA RISENHOOVER is a 47 y.o. female with hypertension. She is taking Metoprolol and HCTZ. [name] denies chest pain or shortness of breath on exertion. She is working weight loss to help control her blood pressure with the goal of decreasing her risk of heart attack and stroke. [name] is well controlled.  Vitamin D deficiency Roniya has a diagnosis of vitamin D deficiency. Masiel is currently taking vit  D per her PCP and she denies nausea, vomiting or muscle weakness.  ASSESSMENT AND PLAN:  Type 2 diabetes mellitus without complication, without long-term current use of insulin (HCC)  Other hyperlipidemia  Essential hypertension  Vitamin D deficiency  Class 2 severe obesity with serious comorbidity and body mass index (BMI) of 38.0 to 38.9 in adult, unspecified obesity type (Chelsea Bray)  PLAN:  Diabetes II Jael has been given extensive diabetes education by myself today including ideal fasting and post-prandial blood glucose readings, individual ideal Hgb A1c goals and hypoglycemia prevention. We discussed the importance of good blood sugar control to decrease the likelihood of diabetic complications such as nephropathy, neuropathy, limb loss, blindness, coronary artery disease, and death. We discussed the importance of intensive lifestyle modification including diet, exercise and weight loss as the first line treatment for diabetes. Thara agrees to continue her diabetes medications and will follow up at the agreed upon time.  Hyperlipidemia Paytan was informed of the American Heart Association Guidelines emphasizing intensive lifestyle modifications as the first line treatment for hyperlipidemia. We discussed many lifestyle modifications today in depth, and Ayme will continue to work on decreasing saturated fats such as fatty red meat, butter and many fried foods. She will also increase vegetables and lean protein in her diet and continue to work on exercise and weight loss efforts. Diamon will continue her medications as prescribed.  Hypertension We discussed sodium restriction, working on healthy weight loss, and a regular exercise program as the means to achieve improved blood pressure control. Gerilyn agreed with this  plan and agreed to follow up as directed. We will continue to monitor her blood pressure as well as her progress with the above lifestyle modifications. She  will continue her medications as prescribed and will watch for signs of hypotension as she continues her lifestyle modifications. She will continue her medications as prescribed.  Vitamin D Deficiency Chelsea Bray was informed that low vitamin D levels contributes to fatigue and are associated with obesity, breast, and colon cancer. She will continue to take high dose prescription Vit D _0 ,000 IU every week and will follow up for routine testing of vitamin D, at least 2-3 times per year. She was informed of the risk of over-replacement of vitamin D and agrees to not increase her dose unless she discusses this with Chelsea Bray first.  I spent > than 50% of the 15 minute visit on counseling as documented in the note.  Obesity Chelsea Bray is currently in the action stage of change. As such, her goal is to continue with weight loss efforts She has agreed to follow the Category 3 plan Chelsea Bray has been instructed to work up to a goal of 150 minutes of combined cardio and strengthening exercise per week for weight loss and overall health benefits. We discussed the following Behavioral Modification Strategies today: increase H2O intake, increasing lean protein intake, decreasing simple carbohydrates, increasing vegetables and work on more meal planning and easy cooking plans Chelsea Bray will move her food around if needed.  Chelsea Bray has agreed to follow up with our clinic in 2 weeks. She was informed of the importance of frequent follow up visits to maximize her success with intensive lifestyle modifications for her multiple health conditions.  ALLERGIES: No Known Allergies  MEDICATIONS: Current Outpatient Medications on File Prior to Visit  Medication Sig Dispense Refill  . azelastine (OPTIVAR) 0.05 % ophthalmic solution Place 1 drop into both eyes 2 (two) times daily. 6 mL 1  . BIOTIN PO Take by mouth.    . ezetimibe-simvastatin (VYTORIN) 10-10 MG tablet Take 1 tablet by mouth at bedtime. 30 tablet 5  .  glucose monitoring kit (FREESTYLE) monitoring kit 1 each by Does not apply route 3 (three) times a week. Or any monitor that insurance will cover  DX:790.29 1 each 0  . hydrochlorothiazide (HYDRODIURIL) 25 MG tablet Take 1 tablet (25 mg total) by mouth daily. 90 tablet 1  . hyoscyamine (LEVSIN SL) 0.125 MG SL tablet Place 1 tablet (0.125 mg total) under the tongue every 4 (four) hours as needed. 30 tablet 1  . Lactobacillus Rhamnosus, GG, (CULTURELLE PO) Take by mouth.    . Lancets (FREESTYLE) lancets DX: 790.29 100 each 0  . metFORMIN (GLUCOPHAGE-XR) 500 MG 24 hr tablet Take 1 tablet (500 mg total) by mouth daily with breakfast. 180 tablet 1  . metoprolol succinate (TOPROL-XL) 50 MG 24 hr tablet TAKE 1 TABLET BY MOUTH DAILY WITH OR IMMEDIATELY FOLLOWING A MEAL 30 tablet 5  . Vitamin D, Ergocalciferol, (DRISDOL) 50000 units CAPS capsule TAKE 1 CAPSULE BY MOUTH ONCE A WEEK 12 capsule 1   No current facility-administered medications on file prior to visit.     PAST MEDICAL HISTORY: Past Medical History:  Diagnosis Date  . Acid reflux disease   . Arthritis   . Colon polyps 02/21/2017  . Diabetes ([place]) 11/17/2011  . Diabetes mellitus type 2 in obese ([place]) 11/17/2011  . History of esophageal stricture 02/21/2017  . Hyperlipidemia   . Hypertension   . IBS (irritable bowel syndrome)   .  Insomnia 03/08/2016  . Lactose intolerance   . Morbid obesity ([place]) 01/10/2013  . Pain in joint, upper arm 11/08/2015  . Preventative health care 11/29/2013  . Sleep apnea   . Swelling of both lower extremities   . TOS (thoracic outlet syndrome) 11/20/2015  . Vaginitis and vulvovaginitis 12/23/2014  . Vitamin D deficiency     PAST SURGICAL HISTORY: Past Surgical History:  Procedure Laterality Date  . BREAST REDUCTION SURGERY  2001  . CEREBRAL MICROVASCULAR DECOMPRESSION  2006   back of her head  . CHOLECYSTECTOMY    . GALLBLADDER SURGERY  1991   gall stone removed  . MOLE REMOVAL    . REDUCTION MAMMAPLASTY  Bilateral 2001    SOCIAL HISTORY: Social History   Tobacco Use  . Smoking status: Never Smoker  . Smokeless tobacco: Never Used  Substance Use Topics  . Alcohol use: Yes    Alcohol/week: 0.0 standard drinks    Comment: wine ocassion  . Drug use: No    FAMILY HISTORY: Family History  Problem Relation Age of Onset  . Heart disease Maternal Grandmother        MI  . Stroke Maternal Grandmother   . Diabetes Maternal Grandmother   . Arrhythmia Mother   . Diabetes Mother        maternal grandmother and aunts  . Hypertension Mother   . Heart attack Father   . Stroke Father   . Hyperlipidemia Father   . Hypertension Father   . Cancer Father        lung, smoker  . Heart disease Father        CABG x 4  . Alcoholism Father   . Prostate cancer Maternal Grandfather   . Cancer Maternal Grandfather        colon cancer, prostate  . Alcohol abuse Paternal Grandmother        died of pneumonia  . Heart disease Paternal Grandfather        MI  . Asthma Daughter        allergies  . Hypertension Unknown        parents and maternal parents  . Breast cancer Neg Hx     ROS: Review of Systems  Constitutional: Positive for weight loss.  Gastrointestinal: Negative for nausea and vomiting.  Musculoskeletal: Negative for myalgias.       Negative for muscle weakness  Endo/Heme/Allergies:       Negative for polyphagia Negative for hypoglycemia    PHYSICAL EXAM: Blood pressure 124/84, pulse 76, temperature 98 F (36.7 C), temperature source Oral, height _0  (1.651 m), weight 229 lb (103.9 kg), last menstrual period 08/06/2018, SpO2 98 %. Body mass index is 38.11 kg/m. Physical Exam Vitals signs reviewed.  Constitutional:      Appearance: Normal appearance. She is well-developed. She is obese.  Cardiovascular:     Rate and Rhythm: Normal rate.  Pulmonary:     Effort: Pulmonary effort is normal.  Musculoskeletal: Normal range of motion.  Skin:    General: Skin is warm and  dry.  Neurological:     Mental Status: She is alert and oriented to person, place, and time.  Psychiatric:        Mood and Affect: Mood normal.        Behavior: Behavior normal.     RECENT LABS AND TESTS: BMET    Component Value Date/Time   NA 140 08/07/2018 1217   K 4.4 08/07/2018 1217   CL 104 08/07/2018  1217   CO2 22 08/07/2018 1217   GLUCOSE 166 (H) 08/07/2018 1217   GLUCOSE 178 (H) 05/01/2018 0958   BUN 10 08/07/2018 1217   CREATININE 0.90 08/07/2018 1217   CREATININE 0.78 11/23/2013 1131   CALCIUM 9.3 08/07/2018 1217   GFRNONAA 76 08/07/2018 1217   GFRAA 87 08/07/2018 1217   Lab Results  Component Value Date   HGBA1C 8.6 (H) 08/07/2018   HGBA1C 9.1 (H) 05/01/2018   HGBA1C 9.9 (H) 11/08/2017   HGBA1C 8.1 (H) 02/21/2017   HGBA1C 7.2 (H) 05/30/2016   Lab Results  Component Value Date   INSULIN 35.6 (H) 08/07/2018   CBC    Component Value Date/Time   WBC 6.8 08/07/2018 1217   WBC 6.0 05/01/2018 0958   RBC 4.16 08/07/2018 1217   RBC 4.35 05/01/2018 0958   HGB 12.8 08/07/2018 1217   HCT 38.0 08/07/2018 1217   PLT 328.0 05/01/2018 0958   MCV 91 08/07/2018 1217   MCH 30.8 08/07/2018 1217   MCH 31.6 03/22/2015 2230   MCHC 33.7 08/07/2018 1217   MCHC 34.0 05/01/2018 0958   RDW 13.6 08/07/2018 1217   LYMPHSABS 1.6 08/07/2018 1217   MONOABS 0.5 03/22/2015 2230   EOSABS 0.1 08/07/2018 1217   BASOSABS 0.0 08/07/2018 1217   Iron/TIBC/Ferritin/ %Sat No results found for: IRON, TIBC, FERRITIN, IRONPCTSAT Lipid Panel     Component Value Date/Time   CHOL 185 08/07/2018 1217   TRIG 134 08/07/2018 1217   HDL 39 (L) 08/07/2018 1217   CHOLHDL 5 05/01/2018 0958   VLDL 32.0 05/01/2018 0958   LDLCALC 119 (H) 08/07/2018 1217   LDLDIRECT 149.0 02/21/2017 1613   Hepatic Function Panel     Component Value Date/Time   PROT 6.6 08/07/2018 1217   ALBUMIN 4.1 08/07/2018 1217   AST 16 08/07/2018 1217   ALT 21 08/07/2018 1217   ALKPHOS 64 08/07/2018 1217   BILITOT  0.2 08/07/2018 1217   BILIDIR 0.1 11/23/2013 1131   IBILI 0.3 11/23/2013 1131      Component Value Date/Time   TSH 1.41 05/01/2018 0958   TSH 1.15 02/21/2017 1613   TSH 1.95 05/30/2016 0856    Ref. Range 08/07/2018 12:17  Vitamin D, 25-Hydroxy Latest Ref Range: 30.0 - 100.0 ng/mL 41.8     OBESITY BEHAVIORAL INTERVENTION VISIT  Today's visit was # 2   Starting weight: 236 lbs Starting date: 08/07/2018 Today's weight : 229 lbs Today's date: 09/01/2018 Total lbs lost to date: 7   ASK: We discussed the diagnosis of obesity with [name] today and Lendon Collar agreed to give Chelsea Bray permission to discuss obesity behavioral modification therapy today.  ASSESS: Delta has the diagnosis of obesity and her BMI today is 38.11 Karcyn is in the action stage of change   ADVISE: Leiyah was educated on the multiple health risks of obesity as well as the benefit of weight loss to improve her health. She was advised of the need for long term treatment and the importance of lifestyle modifications to improve her current health and to decrease her risk of future health problems.  AGREE: Multiple dietary modification options and treatment options were discussed and  Rainah agreed to follow the recommendations documented in the above note.  ARRANGE: Brad was educated on the importance of frequent visits to treat obesity as outlined per CMS and USPSTF guidelines and agreed to schedule her next follow up appointment today.  Corey Skains, am acting as Location manager for [employer]. Owens Shark,  DO  I have reviewed the above documentation for accuracy and completeness, and I agree with the above. -Jearld Lesch, DO

## 2018-09-02 ENCOUNTER — Encounter (INDEPENDENT_AMBULATORY_CARE_PROVIDER_SITE_OTHER): Payer: Self-pay | Admitting: Bariatrics

## 2018-09-18 ENCOUNTER — Ambulatory Visit (INDEPENDENT_AMBULATORY_CARE_PROVIDER_SITE_OTHER): Payer: PRIVATE HEALTH INSURANCE | Admitting: Family Medicine

## 2018-09-18 ENCOUNTER — Encounter (INDEPENDENT_AMBULATORY_CARE_PROVIDER_SITE_OTHER): Payer: Self-pay | Admitting: Family Medicine

## 2018-09-18 VITALS — BP 141/91 | HR 88 | Temp 97.9°F | Ht 65.0 in | Wt 229.0 lb

## 2018-09-18 DIAGNOSIS — E1165 Type 2 diabetes mellitus with hyperglycemia: Secondary | ICD-10-CM | POA: Diagnosis not present

## 2018-09-18 DIAGNOSIS — Z6838 Body mass index (BMI) 38.0-38.9, adult: Secondary | ICD-10-CM

## 2018-09-22 ENCOUNTER — Encounter (INDEPENDENT_AMBULATORY_CARE_PROVIDER_SITE_OTHER): Payer: Self-pay | Admitting: Family Medicine

## 2018-09-22 NOTE — Progress Notes (Signed)
Office: [phone]  /  Fax: [phone]   HPI:   Chief Complaint: OBESITY Chelsea Bray is here to discuss her progress with her obesity treatment plan. She is on the Category 3 plan and is following her eating plan approximately 75 % of the time. She states she is walking 1 mile and doing cardio 3 times per week. Chelsea Bray is getting in all of her protein. 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  Her weight is 229 lb (103.9 kg) today and has not lost weight since her last visit. She has lost 7 lbs since starting treatment with [place].  Diabetes II Chelsea Bray has a diagnosis of diabetes type II. Chelsea Bray states that her fasting BGs range between 121 and 140 and her 2 hour post prandial sugars range from 121 to 176. She denies any polyphagia or hypoglycemic episodes. Last A1c was 8.6 on 08/07/18. She has been working on intensive lifestyle modifications including diet, exercise, and weight loss to help control her blood glucose levels.  ASSESSMENT AND PLAN:  Type 2 diabetes mellitus without complication, without long-term current use of insulin (HCC)  Class 2 severe obesity with serious comorbidity and body mass index (BMI) of 38.0 to 38.9 in adult, unspecified obesity type ([place])  PLAN:  Diabetes II Chelsea Bray has been given extensive diabetes education by myself today including ideal fasting and post-prandial blood glucose readings, individual ideal Hgb A1c goals, and hypoglycemia prevention. We discussed the importance of good blood sugar control to decrease the likelihood of diabetic complications such as nephropathy, neuropathy, limb loss, blindness, coronary artery disease, and death. We discussed the importance of intensive lifestyle modification including diet, exercise and weight loss as the first line treatment for diabetes. Chelsea Bray agrees to continue her metformin and meal plan and will follow up at the agreed upon time in 2 weeks.  I spent >  than 50% of the 15 minute visit on counseling as documented in the note.  Obesity Chelsea Bray is currently in the action stage of change. As such, her goal is to continue with weight loss efforts. She has agreed to follow the Category 3 plan. Chelsea Bray has been instructed to continue walking 1 mile and doing cardio 3 times a week. We discussed the following Behavioral Modification Strategies today: work on meal planning and easy cooking plans and travel eating strategies, and planning for success.  Chelsea Bray has agreed to follow up with our clinic in 2 weeks. She was informed of the importance of frequent follow up visits to maximize her success with intensive lifestyle modifications for her multiple health conditions.  ALLERGIES: No Known Allergies  MEDICATIONS: Current Outpatient Medications on File Prior to Visit  Medication Sig Dispense Refill  . azelastine (OPTIVAR) 0.05 % ophthalmic solution Place 1 drop into both eyes 2 (two) times daily. 6 mL 1  . BIOTIN PO Take by mouth.    . ezetimibe-simvastatin (VYTORIN) 10-10 MG tablet Take 1 tablet by mouth at bedtime. 30 tablet 5  . glucose monitoring kit (FREESTYLE) monitoring kit 1 each by Does not apply route 3 (three) times a week. Or any monitor that insurance will cover  DX:790.29 1 each 0  . hydrochlorothiazide (HYDRODIURIL) 25 MG tablet Take 1 tablet (25 mg total) by mouth daily. 90 tablet 1  . hyoscyamine (LEVSIN SL) 0.125 MG SL tablet Place 1 tablet (0.125 mg total) under the tongue every 4 (four) hours as needed. 30 tablet 1  . Lactobacillus Rhamnosus, GG, (CULTURELLE  PO) Take by mouth.    . Lancets (FREESTYLE) lancets DX: 790.29 100 each 0  . metFORMIN (GLUCOPHAGE-XR) 500 MG 24 hr tablet Take 1 tablet (500 mg total) by mouth daily with breakfast. 180 tablet 1  . metoprolol succinate (TOPROL-XL) 50 MG 24 hr tablet TAKE 1 TABLET BY MOUTH DAILY WITH OR IMMEDIATELY FOLLOWING A MEAL 30 tablet 5  . Vitamin D, Ergocalciferol, (DRISDOL)  50000 units CAPS capsule TAKE 1 CAPSULE BY MOUTH ONCE A WEEK 12 capsule 1   No current facility-administered medications on file prior to visit.     PAST MEDICAL HISTORY: Past Medical History:  Diagnosis Date  . Acid reflux disease   . Arthritis   . Colon polyps 02/21/2017  . Diabetes ([place]) 11/17/2011  . Diabetes mellitus type 2 in obese ([place]) 11/17/2011  . History of esophageal stricture 02/21/2017  . Hyperlipidemia   . Hypertension   . IBS (irritable bowel syndrome)   . Insomnia 03/08/2016  . Lactose intolerance   . Morbid obesity ([place]) 01/10/2013  . Pain in joint, upper arm 11/08/2015  . Preventative health care 11/29/2013  . Sleep apnea   . Swelling of both lower extremities   . TOS (thoracic outlet syndrome) 11/20/2015  . Vaginitis and vulvovaginitis 12/23/2014  . Vitamin D deficiency     PAST SURGICAL HISTORY: Past Surgical History:  Procedure Laterality Date  . BREAST REDUCTION SURGERY  2001  . CEREBRAL MICROVASCULAR DECOMPRESSION  2006   back of her head  . CHOLECYSTECTOMY    . GALLBLADDER SURGERY  1991   gall stone removed  . MOLE REMOVAL    . REDUCTION MAMMAPLASTY Bilateral 2001    SOCIAL HISTORY: Social History   Tobacco Use  . Smoking status: Never Smoker  . Smokeless tobacco: Never Used  Substance Use Topics  . Alcohol use: Yes    Alcohol/week: 0.0 standard drinks    Comment: wine ocassion  . Drug use: No    FAMILY HISTORY: Family History  Problem Relation Age of Onset  . Heart disease Maternal Grandmother        MI  . Stroke Maternal Grandmother   . Diabetes Maternal Grandmother   . Arrhythmia Mother   . Diabetes Mother        maternal grandmother and aunts  . Hypertension Mother   . Heart attack Father   . Stroke Father   . Hyperlipidemia Father   . Hypertension Father   . Cancer Father        lung, smoker  . Heart disease Father        CABG x 4  . Alcoholism Father   . Prostate cancer Maternal Grandfather   . Cancer Maternal Grandfather         colon cancer, prostate  . Alcohol abuse Paternal Grandmother        died of pneumonia  . Heart disease Paternal Grandfather        MI  . Asthma Daughter        allergies  . Hypertension Unknown        parents and maternal parents  . Breast cancer Neg Hx    ROS: Review of Systems  Constitutional: Negative for weight loss.  Endo/Heme/Allergies:       Negative for polyphagia. Negative for hypoglycemia.   PHYSICAL EXAM: Blood pressure (!) 141/91, pulse 88, temperature 97.9 F (36.6 C), temperature source Oral, height '5\' 5"'$  (1.651 m), weight 229 lb (103.9 kg), SpO2 99 %. Body mass index is  38.11 kg/m. Physical Exam Vitals signs reviewed.  Constitutional:      Appearance: Normal appearance. She is obese.  Cardiovascular:     Rate and Rhythm: Normal rate.  Pulmonary:     Effort: Pulmonary effort is normal.  Musculoskeletal: Normal range of motion.  Skin:    General: Skin is warm and dry.  Neurological:     Mental Status: She is alert and oriented to person, place, and time.  Psychiatric:        Mood and Affect: Mood normal.        Behavior: Behavior normal.    RECENT LABS AND TESTS: BMET    Component Value Date/Time   NA 140 08/07/2018 1217   K 4.4 08/07/2018 1217   CL 104 08/07/2018 1217   CO2 22 08/07/2018 1217   GLUCOSE 166 (H) 08/07/2018 1217   GLUCOSE 178 (H) 05/01/2018 0958   BUN 10 08/07/2018 1217   CREATININE 0.90 08/07/2018 1217   CREATININE 0.78 11/23/2013 1131   CALCIUM 9.3 08/07/2018 1217   GFRNONAA 76 08/07/2018 1217   GFRAA 87 08/07/2018 1217   Lab Results  Component Value Date   HGBA1C 8.6 (H) 08/07/2018   HGBA1C 9.1 (H) 05/01/2018   HGBA1C 9.9 (H) 11/08/2017   HGBA1C 8.1 (H) 02/21/2017   HGBA1C 7.2 (H) 05/30/2016   Lab Results  Component Value Date   INSULIN 35.6 (H) 08/07/2018   CBC    Component Value Date/Time   WBC 6.8 08/07/2018 1217   WBC 6.0 05/01/2018 0958   RBC 4.16 08/07/2018 1217   RBC 4.35 05/01/2018 0958   HGB 12.8  08/07/2018 1217   HCT 38.0 08/07/2018 1217   PLT 328.0 05/01/2018 0958   MCV 91 08/07/2018 1217   MCH 30.8 08/07/2018 1217   MCH 31.6 03/22/2015 2230   MCHC 33.7 08/07/2018 1217   MCHC 34.0 05/01/2018 0958   RDW 13.6 08/07/2018 1217   LYMPHSABS 1.6 08/07/2018 1217   MONOABS 0.5 03/22/2015 2230   EOSABS 0.1 08/07/2018 1217   BASOSABS 0.0 08/07/2018 1217   Iron/TIBC/Ferritin/ %Sat No results found for: IRON, TIBC, FERRITIN, IRONPCTSAT Lipid Panel     Component Value Date/Time   CHOL 185 08/07/2018 1217   TRIG 134 08/07/2018 1217   HDL 39 (L) 08/07/2018 1217   CHOLHDL 5 05/01/2018 0958   VLDL 32.0 05/01/2018 0958   LDLCALC 119 (H) 08/07/2018 1217   LDLDIRECT 149.0 02/21/2017 1613   Hepatic Function Panel     Component Value Date/Time   PROT 6.6 08/07/2018 1217   ALBUMIN 4.1 08/07/2018 1217   AST 16 08/07/2018 1217   ALT 21 08/07/2018 1217   ALKPHOS 64 08/07/2018 1217   BILITOT 0.2 08/07/2018 1217   BILIDIR 0.1 11/23/2013 1131   IBILI 0.3 11/23/2013 1131      Component Value Date/Time   TSH 1.41 05/01/2018 0958   TSH 1.15 02/21/2017 1613   TSH 1.95 05/30/2016 0856   Results for KUMIKO, FISHMAN (MRN [id number]) as of 09/22/2018 08:01  Ref. Range 08/07/2018 12:17  Vitamin D, 25-Hydroxy Latest Ref Range: 30.0 - 100.0 ng/mL 41.8    OBESITY BEHAVIORAL INTERVENTION VISIT  Today's visit was # 3   Starting weight: 236 lbs Starting date: 08/07/18 Today's weight : Weight: 229 lb (103.9 kg)  Today's date: 09/18/2018 Total lbs lost to date: 7  ASK: We discussed the diagnosis of obesity with [name] today and Lendon Collar agreed to give [place] permission to discuss obesity behavioral modification therapy  today.  ASSESS: Siniyah has the diagnosis of obesity and her BMI today is 38.1. Pocahontas is in the action stage of change.   ADVISE: Lubna was educated on the multiple health risks of obesity as well as the benefit of weight loss to improve her health.  She was advised of the need for long term treatment and the importance of lifestyle modifications to improve her current health and to decrease her risk of future health problems.  AGREE: Multiple dietary modification options and treatment options were discussed and Alvis agreed to follow the recommendations documented in the above note.  ARRANGE: Doshia was educated on the importance of frequent visits to treat obesity as outlined per CMS and USPSTF guidelines and agreed to schedule her next follow up appointment today.  I, Marcille Blanco, am acting as Location manager for [employer], FNP-C.  I have reviewed the above documentation for accuracy and completeness, and I agree with the above.  - Dawn Whitmire, FNP-C.

## 2018-10-02 ENCOUNTER — Ambulatory Visit (INDEPENDENT_AMBULATORY_CARE_PROVIDER_SITE_OTHER): Payer: PRIVATE HEALTH INSURANCE | Admitting: Family Medicine

## 2018-10-06 ENCOUNTER — Ambulatory Visit (INDEPENDENT_AMBULATORY_CARE_PROVIDER_SITE_OTHER): Payer: PRIVATE HEALTH INSURANCE | Admitting: Physician Assistant

## 2018-10-25 NOTE — Progress Notes (Deleted)
[place] [facility] [address] [place], [place] [address] [phone]  Date:  10/29/2018   Name:  Chelsea Bray   DOB:  1970/09/29   MRN:  [id number]  PCP:  Mosie Lukes, MD    Chief Complaint: No chief complaint on file.   History of Present Illness:  Chelsea Bray is a 48 y.o. very pleasant female patient who presents with the following:  Patient of Dr. Charlett Blake who I did see in 2017, with history of diabetes, esophageal stricture, thoracic outlet syndrome, hypertension, sleep apnea Here today with concern of  Patient Active Problem List   Diagnosis Date Noted  . Type 2 diabetes mellitus with hyperglycemia, without long-term current use of insulin ([place]) 08/12/2018  . Hip pain, chronic, left 05/11/2018  . Thumb pain 12/27/2017  . Colon polyps 02/21/2017  . History of esophageal stricture 02/21/2017  . Insomnia 03/08/2016  . Palpitations 11/20/2015  . TOS (thoracic outlet syndrome) 11/20/2015  . Pain in joint, upper arm 11/08/2015  . Vaginitis and vulvovaginitis 12/23/2014  . Allergic rhinitis 11/18/2014  . Atypical chest pain 11/29/2013  . Vitamin D deficiency 11/29/2013  . Allergic state 11/29/2013  . Preventative health care 11/29/2013  . Carpal tunnel syndrome of right wrist 07/21/2013  . Headache(784.0) 03/11/2013  . Class 2 severe obesity with serious comorbidity and body mass index (BMI) of 38.0 to 38.9 in adult ([place]) 01/10/2013  . Eustachian tube dysfunction 08/13/2012  . Essential hypertension 06/13/2012  . Neuralgia 12/12/2011  . Diabetes mellitus type 2 in obese ([place]) 11/17/2011  . Back pain 11/16/2011  . Knee pain 09/29/2011  . Hyperlipidemia, mixed 09/29/2011  . GERD (gastroesophageal reflux disease) 09/29/2011  . OSA (obstructive sleep apnea) 09/29/2011    Past Medical History:  Diagnosis Date  . Acid reflux disease   . Arthritis   . Colon polyps 02/21/2017  . Diabetes ([place])  11/17/2011  . Diabetes mellitus type 2 in obese ([place]) 11/17/2011  . History of esophageal stricture 02/21/2017  . Hyperlipidemia   . Hypertension   . IBS (irritable bowel syndrome)   . Insomnia 03/08/2016  . Lactose intolerance   . Morbid obesity ([place]) 01/10/2013  . Pain in joint, upper arm 11/08/2015  . Preventative health care 11/29/2013  . Sleep apnea   . Swelling of both lower extremities   . TOS (thoracic outlet syndrome) 11/20/2015  . Vaginitis and vulvovaginitis 12/23/2014  . Vitamin D deficiency     Past Surgical History:  Procedure Laterality Date  . BREAST REDUCTION SURGERY  2001  . CEREBRAL MICROVASCULAR DECOMPRESSION  2006   back of her head  . CHOLECYSTECTOMY    . GALLBLADDER SURGERY  1991   gall stone removed  . MOLE REMOVAL    . REDUCTION MAMMAPLASTY Bilateral 2001    Social History   Tobacco Use  . Smoking status: Never Smoker  . Smokeless tobacco: Never Used  Substance Use Topics  . Alcohol use: Yes    Alcohol/week: 0.0 standard drinks    Comment: wine ocassion  . Drug use: No    Family History  Problem Relation Age of Onset  . Heart disease Maternal Grandmother        MI  . Stroke Maternal Grandmother   . Diabetes Maternal Grandmother   . Arrhythmia Mother   . Diabetes Mother        maternal grandmother and aunts  . Hypertension Mother   .  Heart attack Father   . Stroke Father   . Hyperlipidemia Father   . Hypertension Father   . Cancer Father        lung, smoker  . Heart disease Father        CABG x 4  . Alcoholism Father   . Prostate cancer Maternal Grandfather   . Cancer Maternal Grandfather        colon cancer, prostate  . Alcohol abuse Paternal Grandmother        died of pneumonia  . Heart disease Paternal Grandfather        MI  . Asthma Daughter        allergies  . Hypertension Unknown        parents and maternal parents  . Breast cancer Neg Hx     No Known Allergies  Medication list has been reviewed and updated.  Current  Outpatient Medications on File Prior to Visit  Medication Sig Dispense Refill  . azelastine (OPTIVAR) 0.05 % ophthalmic solution Place 1 drop into both eyes 2 (two) times daily. 6 mL 1  . BIOTIN PO Take by mouth.    . ezetimibe-simvastatin (VYTORIN) 10-10 MG tablet Take 1 tablet by mouth at bedtime. 30 tablet 5  . glucose monitoring kit (FREESTYLE) monitoring kit 1 each by Does not apply route 3 (three) times a week. Or any monitor that insurance will cover  DX:790.29 1 each 0  . hydrochlorothiazide (HYDRODIURIL) 25 MG tablet Take 1 tablet (25 mg total) by mouth daily. 90 tablet 1  . hyoscyamine (LEVSIN SL) 0.125 MG SL tablet Place 1 tablet (0.125 mg total) under the tongue every 4 (four) hours as needed. 30 tablet 1  . Lactobacillus Rhamnosus, GG, (CULTURELLE PO) Take by mouth.    . Lancets (FREESTYLE) lancets DX: 790.29 100 each 0  . metFORMIN (GLUCOPHAGE-XR) 500 MG 24 hr tablet Take 1 tablet (500 mg total) by mouth daily with breakfast. 180 tablet 1  . metoprolol succinate (TOPROL-XL) 50 MG 24 hr tablet TAKE 1 TABLET BY MOUTH DAILY WITH OR IMMEDIATELY FOLLOWING A MEAL 30 tablet 5  . Vitamin D, Ergocalciferol, (DRISDOL) 50000 units CAPS capsule TAKE 1 CAPSULE BY MOUTH ONCE A WEEK 12 capsule 1   No current facility-administered medications on file prior to visit.     Review of Systems:  As per HPI- otherwise negative.   Physical Examination: There were no vitals filed for this visit. There were no vitals filed for this visit. There is no height or weight on file to calculate BMI. Ideal Body Weight:    GEN: WDWN, NAD, Non-toxic, A & O x 3 HEENT: Atraumatic, Normocephalic. Neck supple. No masses, No LAD. Ears and Nose: No external deformity. CV: RRR, No M/G/R. No JVD. No thrill. No extra heart sounds. PULM: CTA B, no wheezes, crackles, rhonchi. No retractions. No resp. distress. No accessory muscle use. ABD: S, NT, ND, +BS. No rebound. No HSM. EXTR: No c/c/e NEURO Normal gait.   PSYCH: Normally interactive. Conversant. Not depressed or anxious appearing.  Calm demeanor.    Assessment and Plan: ***  Signed Lamar Blinks, MD

## 2018-10-29 ENCOUNTER — Ambulatory Visit: Payer: Self-pay | Admitting: Family Medicine

## 2018-11-04 ENCOUNTER — Ambulatory Visit: Payer: Self-pay | Admitting: Family Medicine

## 2018-11-07 ENCOUNTER — Other Ambulatory Visit: Payer: Self-pay | Admitting: Family Medicine

## 2018-11-07 DIAGNOSIS — E669 Obesity, unspecified: Principal | ICD-10-CM

## 2018-11-07 DIAGNOSIS — E1169 Type 2 diabetes mellitus with other specified complication: Secondary | ICD-10-CM

## 2018-11-07 MED ORDER — METOPROLOL SUCCINATE ER 50 MG PO TB24: ORAL_TABLET | ORAL | 0 refills | Status: DC

## 2018-11-07 MED ORDER — METFORMIN HCL ER 500 MG PO TB24: 500.0000 mg | ORAL_TABLET | Freq: Every day | ORAL | 0 refills | Status: DC

## 2018-11-07 MED ORDER — EZETIMIBE-SIMVASTATIN 10-10 MG PO TABS: 1.0000 | ORAL_TABLET | Freq: Every day | ORAL | 0 refills | Status: DC

## 2018-11-07 NOTE — Telephone Encounter (Signed)
Copied from CRM [id number]. Topic: Quick Communication - Rx Refill/Question >> Nov 07, 2018 12:44 PM Floria Raveling A wrote: Medication: ezetimibe-simvastatin (VYTORIN) 10-10 MG tablet [[id number]] metFORMIN (GLUCOPHAGE-XR) 500 MG 24 hr tablet [[id number]]  metoprolol succinate (TOPROL-XL) 50 MG 24 hr tablet [[id number]]  Has the patient contacted their pharmacy? Yes  (Agent: If no, request that the patient contact the pharmacy for the refill.) (Agent: If yes, when and what did the pharmacy advise?)  Preferred Pharmacy (with phone number or street name): [facility] - [place], [place] - [address] [phone] (Phone)   Agent: Please be advised that RX refills may take up to 3 business days. We ask that you follow-up with your pharmacy.

## 2018-11-11 ENCOUNTER — Ambulatory Visit (INDEPENDENT_AMBULATORY_CARE_PROVIDER_SITE_OTHER): Payer: PRIVATE HEALTH INSURANCE | Admitting: Family Medicine

## 2018-11-11 ENCOUNTER — Encounter: Payer: Self-pay | Admitting: Family Medicine

## 2018-11-11 VITALS — BP 108/60 | HR 64 | Temp 98.5°F | Resp 18 | Ht 65.0 in | Wt 234.8 lb

## 2018-11-11 DIAGNOSIS — M2550 Pain in unspecified joint: Secondary | ICD-10-CM | POA: Diagnosis not present

## 2018-11-11 DIAGNOSIS — E1165 Type 2 diabetes mellitus with hyperglycemia: Secondary | ICD-10-CM

## 2018-11-11 DIAGNOSIS — I10 Essential (primary) hypertension: Secondary | ICD-10-CM

## 2018-11-11 DIAGNOSIS — E559 Vitamin D deficiency, unspecified: Secondary | ICD-10-CM | POA: Diagnosis not present

## 2018-11-11 DIAGNOSIS — M791 Myalgia, unspecified site: Secondary | ICD-10-CM

## 2018-11-11 DIAGNOSIS — E782 Mixed hyperlipidemia: Secondary | ICD-10-CM | POA: Diagnosis not present

## 2018-11-11 DIAGNOSIS — R350 Frequency of micturition: Secondary | ICD-10-CM | POA: Diagnosis not present

## 2018-11-11 DIAGNOSIS — M545 Low back pain, unspecified: Secondary | ICD-10-CM

## 2018-11-11 DIAGNOSIS — E669 Obesity, unspecified: Secondary | ICD-10-CM

## 2018-11-11 DIAGNOSIS — E1169 Type 2 diabetes mellitus with other specified complication: Secondary | ICD-10-CM

## 2018-11-11 DIAGNOSIS — R2681 Unsteadiness on feet: Secondary | ICD-10-CM

## 2018-11-11 MED ORDER — VITAMIN D (ERGOCALCIFEROL) 1.25 MG (50000 UNIT) PO CAPS: ORAL_CAPSULE | ORAL | 1 refills | Status: DC

## 2018-11-11 MED ORDER — TIZANIDINE HCL 4 MG PO TABS: 4.0000 mg | ORAL_TABLET | Freq: Every evening | ORAL | 0 refills | Status: DC | PRN

## 2018-11-11 NOTE — Patient Instructions (Signed)
Polymyalgia Rheumatica  Polymyalgia rheumatica (PMR) is an inflammatory disorder that causes aching and stiffness in your muscles and joints. Sometimes, PMR leads to a more dangerous condition (temporal arteritis or giant cell arteritis), which can cause vision loss.  What are the causes?  The exact cause of PMR is not known.  What increases the risk?  This condition is more likely to develop in:  · Females.  · People who are 50 years of age or older.  · Caucasians.  What are the signs or symptoms?  Pain and stiffness are the main symptoms of PMR. Symptoms may start slowly or suddenly. The symptoms:  · May be worse after inactivity and in the morning.  · May affect your:  ? Hips, buttocks, and thighs.  ? Neck, arms, and shoulders. This can make it hard to raise your arms above your head.  ? Hands and wrists.  Other symptoms include:  · Fever.  · Tiredness.  · Weakness.  · Decreased appetite. This may lead to weight loss.  How is this diagnosed?  This condition is diagnosed with a medical history and physical exam. You may need to see a health care provider who specializes in diseases of the joint, muscles, and bones (rheumatologist). You may also have tests, including:  · Blood tests.  · X-rays.  How is this treated?  PMR usually goes away without treatment, but it may take years for that to happen. In the meantime, your health care provider may recommend low-dose steroids to help manage your symptoms of pain and stiffness. Regular exercise and rest will also help your symptoms.  Follow these instructions at home:  · Take over-the-counter and prescription medicines only as told by your health care provider.  · Make sure to get enough rest and sleep.  · Eat a healthy and nutritious diet.  · Try to exercise most days of the week. Ask your health care provider what type of exercise is best for you.  · Keep all follow-up visits as told by your health are provider. This is important.  Contact a health care provider  if:  · Your symptoms are not controlled with medicine.  · You have side effects from steroids. These may include:  ? Weight gain.  ? Swelling.  ? Insomnia.  ? Mood changes.  ? Bruising.  ? High blood sugar readings, if you have diabetes.  ? Higher than normal blood pressure readings, if you monitor your blood pressure.  Get help right away if:  · You develop symptoms of temporal arteritis, such as:  ? A change in vision.  ? Severe headache.  ? Scalp pain.  ? Jaw pain.  This information is not intended to replace advice given to you by your health care provider. Make sure you discuss any questions you have with your health care provider.  Document Released: 10/11/2004 Document Revised: 02/09/2016 Document Reviewed: 03/16/2015  Elsevier Interactive Patient Education © 2019 Elsevier Inc.

## 2018-11-12 DIAGNOSIS — R2681 Unsteadiness on feet: Secondary | ICD-10-CM | POA: Insufficient documentation

## 2018-11-12 DIAGNOSIS — M791 Myalgia, unspecified site: Secondary | ICD-10-CM | POA: Insufficient documentation

## 2018-11-12 DIAGNOSIS — R350 Frequency of micturition: Secondary | ICD-10-CM | POA: Insufficient documentation

## 2018-11-12 LAB — URINALYSIS
BILIRUBIN URINE: NEGATIVE
HGB URINE DIPSTICK: NEGATIVE
Ketones, ur: NEGATIVE
Leukocytes,Ua: NEGATIVE
NITRITE: NEGATIVE
Specific Gravity, Urine: 1.025 (ref 1.000–1.030)
TOTAL PROTEIN, URINE-UPE24: NEGATIVE
UROBILINOGEN UA: 0.2 (ref 0.0–1.0)
Urine Glucose: NEGATIVE
pH: 6 (ref 5.0–8.0)

## 2018-11-12 LAB — CBC WITH DIFFERENTIAL/PLATELET
BASOS PCT: 1.2 % (ref 0.0–3.0)
Basophils Absolute: 0.1 10*3/uL (ref 0.0–0.1)
EOS PCT: 0.9 % (ref 0.0–5.0)
Eosinophils Absolute: 0.1 10*3/uL (ref 0.0–0.7)
HCT: 41.3 % (ref 36.0–46.0)
Hemoglobin: 13.8 g/dL (ref 12.0–15.0)
Lymphocytes Relative: 26.7 % (ref 12.0–46.0)
Lymphs Abs: 2.1 10*3/uL (ref 0.7–4.0)
MCHC: 33.4 g/dL (ref 30.0–36.0)
MCV: 92.6 fl (ref 78.0–100.0)
MONO ABS: 0.5 10*3/uL (ref 0.1–1.0)
Monocytes Relative: 6.5 % (ref 3.0–12.0)
NEUTROS PCT: 64.7 % (ref 43.0–77.0)
Neutro Abs: 5.1 10*3/uL (ref 1.4–7.7)
Platelets: 369 10*3/uL (ref 150.0–400.0)
RBC: 4.46 Mil/uL (ref 3.87–5.11)
RDW: 13.8 % (ref 11.5–15.5)
WBC: 7.9 10*3/uL (ref 4.0–10.5)

## 2018-11-12 LAB — COMPREHENSIVE METABOLIC PANEL
ALT: 21 U/L (ref 0–35)
AST: 17 U/L (ref 0–37)
Albumin: 4.1 g/dL (ref 3.5–5.2)
Alkaline Phosphatase: 52 U/L (ref 39–117)
BUN: 15 mg/dL (ref 6–23)
CHLORIDE: 103 meq/L (ref 96–112)
CO2: 29 mEq/L (ref 19–32)
Calcium: 9.9 mg/dL (ref 8.4–10.5)
Creatinine, Ser: 1.01 mg/dL (ref 0.40–1.20)
GFR: 70.66 mL/min (ref >60.00)
Glucose, Bld: 103 mg/dL — ABNORMAL HIGH (ref 70–99)
POTASSIUM: 4.7 meq/L (ref 3.5–5.1)
SODIUM: 140 meq/L (ref 135–145)
Total Bilirubin: 0.3 mg/dL (ref 0.2–1.2)
Total Protein: 6.9 g/dL (ref 6.0–8.3)

## 2018-11-12 LAB — CK: Total CK: 113 U/L (ref 7–177)

## 2018-11-12 LAB — HEMOGLOBIN A1C: Hgb A1c MFr Bld: 7.9 % — ABNORMAL HIGH (ref 4.6–6.5)

## 2018-11-12 LAB — LIPID PANEL
CHOLESTEROL: 204 mg/dL — AB (ref 0–200)
HDL: 41.4 mg/dL (ref >39.00)
LDL Cholesterol: 129 mg/dL — ABNORMAL HIGH (ref 0–99)
NONHDL: 163.02
Total CHOL/HDL Ratio: 5
Triglycerides: 170 mg/dL — ABNORMAL HIGH (ref 0.0–149.0)
VLDL: 34 mg/dL (ref 0.0–40.0)

## 2018-11-12 LAB — MAGNESIUM: MAGNESIUM: 1.9 mg/dL (ref 1.5–2.5)

## 2018-11-12 LAB — SEDIMENTATION RATE: Sed Rate: 17 mm/hr (ref 0–20)

## 2018-11-12 NOTE — Assessment & Plan Note (Signed)
Consider PMR check sed rate and ck if elevated will try course of steroids.

## 2018-11-12 NOTE — Assessment & Plan Note (Signed)
Check UA and urine culture.

## 2018-11-12 NOTE — Assessment & Plan Note (Signed)
Complains of pain and weakness in bilateral thighs/hips worse on the left. Unclear etiology notes some low back pain as well will proceed with xray and lab work if persists consider physical therapy and further imaging.

## 2018-11-12 NOTE — Assessment & Plan Note (Signed)
Supplement and monitor 

## 2018-11-12 NOTE — Assessment & Plan Note (Signed)
Encouraged heart healthy diet, increase exercise, avoid trans fats, consider a krill oil cap daily 

## 2018-11-12 NOTE — Assessment & Plan Note (Signed)
Well controlled, no changes to meds. Encouraged heart healthy diet such as the DASH diet and exercise as tolerated.  °

## 2018-11-12 NOTE — Progress Notes (Signed)
Subjective:    Patient ID: Chelsea Bray, female    DOB: 1971/09/08, 48 y.o.   MRN: [id number]  No chief complaint on file.   HPI Patient is in today for evaluation of pain and weakness diffuse but most notably in bilateral hip/thigh region. Worse on left than right. No recent fall or trauma. No fevers or chills. No polyuria or incontinence. She notes diffuse myalgias is arms as well but not as intense. She is very anxious about her current state. Denies CP/palp/SOB/HA/congestion/fevers/GI or GU c/o. Taking meds as prescribed  Past Medical History:  Diagnosis Date  . Acid reflux disease   . Arthritis   . Colon polyps 02/21/2017  . Diabetes ([place]) 11/17/2011  . Diabetes mellitus type 2 in obese ([place]) 11/17/2011  . History of esophageal stricture 02/21/2017  . Hyperlipidemia   . Hypertension   . IBS (irritable bowel syndrome)   . Insomnia 03/08/2016  . Lactose intolerance   . Morbid obesity ([place]) 01/10/2013  . Pain in joint, upper arm 11/08/2015  . Preventative health care 11/29/2013  . Sleep apnea   . Swelling of both lower extremities   . TOS (thoracic outlet syndrome) 11/20/2015  . Vaginitis and vulvovaginitis 12/23/2014  . Vitamin D deficiency     Past Surgical History:  Procedure Laterality Date  . BREAST REDUCTION SURGERY  2001  . CEREBRAL MICROVASCULAR DECOMPRESSION  2006   back of her head  . CHOLECYSTECTOMY    . GALLBLADDER SURGERY  1991   gall stone removed  . MOLE REMOVAL    . REDUCTION MAMMAPLASTY Bilateral 2001    Family History  Problem Relation Age of Onset  . Heart disease Maternal Grandmother        MI  . Stroke Maternal Grandmother   . Diabetes Maternal Grandmother   . Arrhythmia Mother   . Diabetes Mother        maternal grandmother and aunts  . Hypertension Mother   . Heart attack Father   . Stroke Father   . Hyperlipidemia Father   . Hypertension Father   . Cancer Father        lung, smoker  . Heart disease Father        CABG x 4  . Alcoholism  Father   . Prostate cancer Maternal Grandfather   . Cancer Maternal Grandfather        colon cancer, prostate  . Alcohol abuse Paternal Grandmother        died of pneumonia  . Heart disease Paternal Grandfather        MI  . Asthma Daughter        allergies  . Hypertension Unknown        parents and maternal parents  . Breast cancer Neg Hx     Social History   Socioeconomic History  . Marital status: Single    Spouse name: Not on file  . Number of children: Not on file  . Years of education: Not on file  . Highest education level: Not on file  Occupational History  . Occupation: Designer, fashion/clothing  . Financial resource strain: Not on file  . Food insecurity:    Worry: Not on file    Inability: Not on file  . Transportation needs:    Medical: Not on file    Non-medical: Not on file  Tobacco Use  . Smoking status: Never Smoker  . Smokeless tobacco: Never Used  Substance and Sexual Activity  .  Alcohol use: Yes    Alcohol/week: 0.0 standard drinks    Comment: wine ocassion  . Drug use: No  . Sexual activity: Yes    Birth control/protection: None    Comment: avoid dairy, lives with husband, daughters. works at school bus driver  Lifestyle  . Physical activity:    Days per week: Not on file    Minutes per session: Not on file  . Stress: Not on file  Relationships  . Social connections:    Talks on phone: Not on file    Gets together: Not on file    Attends religious service: Not on file    Active member of club or organization: Not on file    Attends meetings of clubs or organizations: Not on file    Relationship status: Not on file  . Intimate partner violence:    Fear of current or ex partner: Not on file    Emotionally abused: Not on file    Physically abused: Not on file    Forced sexual activity: Not on file  Other Topics Concern  . Not on file  Social History Narrative  . Not on file    Outpatient Medications Prior to Visit  Medication Sig  Dispense Refill  . azelastine (OPTIVAR) 0.05 % ophthalmic solution Place 1 drop into both eyes 2 (two) times daily. 6 mL 1  . BIOTIN PO Take by mouth.    . ezetimibe-simvastatin (VYTORIN) 10-10 MG tablet Take 1 tablet by mouth at bedtime. 5 tablet 0  . hydrochlorothiazide (HYDRODIURIL) 25 MG tablet Take 1 tablet (25 mg total) by mouth daily. 90 tablet 1  . hyoscyamine (LEVSIN SL) 0.125 MG SL tablet Place 1 tablet (0.125 mg total) under the tongue every 4 (four) hours as needed. 30 tablet 1  . Lactobacillus Rhamnosus, GG, (CULTURELLE PO) Take by mouth.    . metFORMIN (GLUCOPHAGE-XR) 500 MG 24 hr tablet Take 1 tablet (500 mg total) by mouth daily with breakfast. 5 tablet 0  . metoprolol succinate (TOPROL-XL) 50 MG 24 hr tablet TAKE 1 TABLET BY MOUTH DAILY WITH OR IMMEDIATELY FOLLOWING A MEAL 5 tablet 0  . glucose monitoring kit (FREESTYLE) monitoring kit 1 each by Does not apply route 3 (three) times a week. Or any monitor that insurance will cover  DX:790.29 1 each 0  . Lancets (FREESTYLE) lancets DX: 790.29 100 each 0  . Vitamin D, Ergocalciferol, (DRISDOL) 50000 units CAPS capsule TAKE 1 CAPSULE BY MOUTH ONCE A WEEK 12 capsule 1   No facility-administered medications prior to visit.     No Known Allergies  Review of Systems  Constitutional: Positive for malaise/fatigue. Negative for fever.  HENT: Negative for congestion.   Eyes: Negative for blurred vision.  Respiratory: Negative for shortness of breath.   Cardiovascular: Negative for chest pain, palpitations and leg swelling.  Gastrointestinal: Negative for abdominal pain, blood in stool and nausea.  Genitourinary: Negative for dysuria and frequency.  Musculoskeletal: Positive for back pain, joint pain and myalgias. Negative for falls.  Skin: Negative for rash.  Neurological: Positive for focal weakness. Negative for dizziness, loss of consciousness and headaches.  Endo/Heme/Allergies: Negative for environmental allergies.    Psychiatric/Behavioral: Negative for depression. The patient is nervous/anxious.        Objective:    Physical Exam Vitals signs and nursing note reviewed.  Constitutional:      General: She is not in acute distress.    Appearance: She is well-developed.  HENT:  Head: Normocephalic and atraumatic.     Nose: Nose normal.  Eyes:     General:        Right eye: No discharge.        Left eye: No discharge.  Neck:     Musculoskeletal: Normal range of motion and neck supple.  Cardiovascular:     Rate and Rhythm: Normal rate and regular rhythm.     Heart sounds: No murmur.  Pulmonary:     Effort: Pulmonary effort is normal.     Breath sounds: Normal breath sounds.  Abdominal:     General: Bowel sounds are normal.     Palpations: Abdomen is soft.     Tenderness: There is no abdominal tenderness.  Skin:    General: Skin is warm and dry.  Neurological:     Mental Status: She is alert and oriented to person, place, and time.     BP 108/60 (BP Location: Right Arm, Patient Position: Sitting, Cuff Size: Large)   Pulse 64   Temp 98.5 F (36.9 C) (Oral)   Resp 18   Ht 5' 5" (1.651 m)   Wt 234 lb 12.8 oz (106.5 kg)   SpO2 98%   BMI 39.07 kg/m  Wt Readings from Last 3 Encounters:  11/11/18 234 lb 12.8 oz (106.5 kg)  09/18/18 229 lb (103.9 kg)  09/01/18 229 lb (103.9 kg)     Lab Results  Component Value Date   WBC 6.8 08/07/2018   HGB 12.8 08/07/2018   HCT 38.0 08/07/2018   PLT 328.0 05/01/2018   GLUCOSE 166 (H) 08/07/2018   CHOL 185 08/07/2018   TRIG 134 08/07/2018   HDL 39 (L) 08/07/2018   LDLDIRECT 149.0 02/21/2017   LDLCALC 119 (H) 08/07/2018   ALT 21 08/07/2018   AST 16 08/07/2018   NA 140 08/07/2018   K 4.4 08/07/2018   CL 104 08/07/2018   CREATININE 0.90 08/07/2018   BUN 10 08/07/2018   CO2 22 08/07/2018   TSH 1.41 05/01/2018   HGBA1C 8.6 (H) 08/07/2018   MICROALBUR 1.4 02/16/2016    Lab Results  Component Value Date   TSH 1.41 05/01/2018    Lab Results  Component Value Date   WBC 6.8 08/07/2018   HGB 12.8 08/07/2018   HCT 38.0 08/07/2018   MCV 91 08/07/2018   PLT 328.0 05/01/2018   Lab Results  Component Value Date   NA 140 08/07/2018   K 4.4 08/07/2018   CO2 22 08/07/2018   GLUCOSE 166 (H) 08/07/2018   BUN 10 08/07/2018   CREATININE 0.90 08/07/2018   BILITOT 0.2 08/07/2018   ALKPHOS 64 08/07/2018   AST 16 08/07/2018   ALT 21 08/07/2018   PROT 6.6 08/07/2018   ALBUMIN 4.1 08/07/2018   CALCIUM 9.3 08/07/2018   ANIONGAP 10 03/22/2015   GFR 80.78 05/01/2018   Lab Results  Component Value Date   CHOL 185 08/07/2018   Lab Results  Component Value Date   HDL 39 (L) 08/07/2018   Lab Results  Component Value Date   LDLCALC 119 (H) 08/07/2018   Lab Results  Component Value Date   TRIG 134 08/07/2018   Lab Results  Component Value Date   CHOLHDL 5 05/01/2018   Lab Results  Component Value Date   HGBA1C 8.6 (H) 08/07/2018       Assessment & Plan:   Problem List Items Addressed This Visit    Hyperlipidemia, mixed    Encouraged heart healthy diet, increase  exercise, avoid trans fats, consider a krill oil cap daily      Relevant Orders   Lipid panel   Back pain   Relevant Medications   tiZANidine (ZANAFLEX) 4 MG tablet   Other Relevant Orders   Antinuclear Antib (ANA)   Diabetes mellitus type 2 in obese (HCC)    hgba1c acceptable, minimize simple carbs. Increase exercise as tolerated. Continue current meds      Essential hypertension    Well controlled, no changes to meds. Encouraged heart healthy diet such as the DASH diet and exercise as tolerated.       Vitamin D deficiency    Supplement and monitor      Relevant Orders   VITAMIN D 25 Hydroxy (Vit-D Deficiency, Fractures)   Type 2 diabetes mellitus with hyperglycemia, without long-term current use of insulin (HCC)   Relevant Orders   Hemoglobin A1c   Urinary frequency    Check UA and urine culture.       Relevant Orders    CBC with Differential/Platelet   Comprehensive metabolic panel   Urinalysis   Urine Culture   Myalgia - Primary    Consider PMR check sed rate and ck if elevated will try course of steroids.      Relevant Orders   Magnesium   Sedimentation rate   CK (Creatine Kinase)   Antinuclear Antib (ANA)   Unsteady gait    Complains of pain and weakness in bilateral thighs/hips worse on the left. Unclear etiology notes some low back pain as well will proceed with xray and lab work if persists consider physical therapy and further imaging.        Other Visit Diagnoses    Arthralgia, unspecified joint       Relevant Orders   Magnesium   Sedimentation rate   CK (Creatine Kinase)   Antinuclear Antib (ANA)   DG Lumbar Spine Complete      I have discontinued Leota D. Kotowski's freestyle and glucose monitoring kit. I have also changed her Vitamin D (Ergocalciferol). Additionally, I am having her start on tiZANidine. Lastly, I am having her maintain her hydrochlorothiazide, hyoscyamine, azelastine, BIOTIN PO, (Lactobacillus Rhamnosus, GG, (CULTURELLE PO)), ezetimibe-simvastatin, metFORMIN, and metoprolol succinate.  Meds ordered this encounter  Medications  . Vitamin D, Ergocalciferol, (DRISDOL) 1.25 MG (50000 UT) CAPS capsule    Sig: TAKE 1 CAPSULE BY MOUTH ONCE A WEEK    Dispense:  12 capsule    Refill:  1    Please consider 90 day supplies to promote better adherence  . tiZANidine (ZANAFLEX) 4 MG tablet    Sig: Take 1 tablet (4 mg total) by mouth at bedtime as needed for muscle spasms.    Dispense:  30 tablet    Refill:  0     Penni Homans, MD

## 2018-11-12 NOTE — Assessment & Plan Note (Signed)
hgba1c acceptable, minimize simple carbs. Increase exercise as tolerated. Continue current meds 

## 2018-11-13 ENCOUNTER — Other Ambulatory Visit: Payer: Self-pay

## 2018-11-13 DIAGNOSIS — E669 Obesity, unspecified: Principal | ICD-10-CM

## 2018-11-13 DIAGNOSIS — E1169 Type 2 diabetes mellitus with other specified complication: Secondary | ICD-10-CM

## 2018-11-13 LAB — VITAMIN D 25 HYDROXY (VIT D DEFICIENCY, FRACTURES): VITD: 65.25 ng/mL (ref 30.00–100.00)

## 2018-11-13 LAB — URINE CULTURE
MICRO NUMBER:: 245220
Result:: NO GROWTH
SPECIMEN QUALITY:: ADEQUATE

## 2018-11-13 LAB — ANA: Anti Nuclear Antibody(ANA): NEGATIVE

## 2018-11-13 MED ORDER — METOPROLOL SUCCINATE ER 50 MG PO TB24: ORAL_TABLET | ORAL | 1 refills | Status: DC

## 2018-11-13 MED ORDER — EZETIMIBE-SIMVASTATIN 10-10 MG PO TABS: 1.0000 | ORAL_TABLET | Freq: Every day | ORAL | 1 refills | Status: DC

## 2018-11-13 MED ORDER — METFORMIN HCL ER 500 MG PO TB24: 500.0000 mg | ORAL_TABLET | Freq: Every day | ORAL | 1 refills | Status: DC

## 2018-11-18 MED ORDER — METFORMIN HCL ER 500 MG PO TB24: 500.0000 mg | ORAL_TABLET | Freq: Two times a day (BID) | ORAL | 0 refills | Status: DC

## 2018-11-18 NOTE — Addendum Note (Signed)
Addended by: Crissie Sickles A on: 11/18/2018 05:35 PM   Modules accepted: Orders

## 2018-12-31 ENCOUNTER — Telehealth: Payer: Self-pay | Admitting: Family Medicine

## 2018-12-31 MED ORDER — METFORMIN HCL ER 500 MG PO TB24: 500.0000 mg | ORAL_TABLET | Freq: Two times a day (BID) | ORAL | 0 refills | Status: DC

## 2018-12-31 NOTE — Telephone Encounter (Signed)
[facility] in [place] called and stated that the patient left her metformin at home and she is needing it sent to the [facility] pharmacy in [place] so that she will not have to miss her medication. Please advise

## 2018-12-31 NOTE — Addendum Note (Signed)
Addended by: Orlene Och on: 12/31/2018 03:39 PM   Modules accepted: Orders

## 2018-12-31 NOTE — Telephone Encounter (Signed)
Rx sent to pharmacy   

## 2019-01-06 ENCOUNTER — Telehealth: Payer: Self-pay | Admitting: Family Medicine

## 2019-01-06 NOTE — Telephone Encounter (Signed)
LVM for pt to call the office and reschedule her appt from 01-08-2019 as VOV at pt convenient time.

## 2019-01-07 ENCOUNTER — Ambulatory Visit (INDEPENDENT_AMBULATORY_CARE_PROVIDER_SITE_OTHER): Payer: PRIVATE HEALTH INSURANCE | Admitting: Family Medicine

## 2019-01-07 ENCOUNTER — Other Ambulatory Visit: Payer: Self-pay

## 2019-01-07 DIAGNOSIS — E782 Mixed hyperlipidemia: Secondary | ICD-10-CM

## 2019-01-07 DIAGNOSIS — E559 Vitamin D deficiency, unspecified: Secondary | ICD-10-CM

## 2019-01-07 DIAGNOSIS — E1165 Type 2 diabetes mellitus with hyperglycemia: Secondary | ICD-10-CM

## 2019-01-07 DIAGNOSIS — Z7189 Other specified counseling: Secondary | ICD-10-CM

## 2019-01-07 DIAGNOSIS — I10 Essential (primary) hypertension: Secondary | ICD-10-CM | POA: Diagnosis not present

## 2019-01-07 DIAGNOSIS — G8929 Other chronic pain: Secondary | ICD-10-CM

## 2019-01-07 DIAGNOSIS — M25552 Pain in left hip: Secondary | ICD-10-CM

## 2019-01-07 MED ORDER — METFORMIN HCL ER 500 MG PO TB24: 500.0000 mg | ORAL_TABLET | Freq: Three times a day (TID) | ORAL | 1 refills | Status: DC

## 2019-01-07 MED ORDER — METOPROLOL SUCCINATE ER 50 MG PO TB24: ORAL_TABLET | ORAL | 1 refills | Status: DC

## 2019-01-07 NOTE — Progress Notes (Signed)
Virtual Visit via Video Note  I connected with Chelsea Bray on 01/07/19 at 10:00 AM EDT by a video enabled telemedicine application and verified that I am speaking with the correct person using two identifiers.   I discussed the limitations of evaluation and management by telemedicine and the availability of in person appointments. The patient expressed understanding and agreed to proceed. Chelsea Bray was able to get patient set up on video visit platform for visit from patient's home    Subjective:    Patient ID: Chelsea Bray, female    DOB: [birth year], 48 y.o.   MRN: [id number]  Chief Complaint  Patient presents with  . Myalgia    follow up     HPI Patient is in today for follow up on chronic concerns including pain, diabetes, hyperlipidemia and hypertension. She feels well today. She notes her pain isimproved since last visit. Her hip and thigh had not been bothering much til last night at 3 am when the pain on the lateral aspect o her left thigh returned. After getting up this morning it has resolved again. Her arm pain has improved. Her sugars have ranged from 133 to 165 this month. Does not endorse polyuria or polydipsia. She is still traveling some for work but denies any recent febrile illness or hospitalizations. No c/o CP/palp/SOB/HA/congestion/fevers/GI or GU c/o. Taking meds as prescribed  Past Medical History:  Diagnosis Date  . Acid reflux disease   . Arthritis   . Colon polyps 02/21/2017  . Diabetes (HCC) 11/17/2011  . Diabetes mellitus type 2 in obese (HCC) 11/17/2011  . History of esophageal stricture 02/21/2017  . Hyperlipidemia   . Hypertension   . IBS (irritable bowel syndrome)   . Insomnia 03/08/2016  . Lactose intolerance   . Morbid obesity (HCC) 01/10/2013  . Pain in joint, upper arm 11/08/2015  . Preventative health care 11/29/2013  . Sleep apnea   . Swelling of both lower extremities   . TOS (thoracic outlet syndrome) 11/20/2015  . Vaginitis and  vulvovaginitis 12/23/2014  . Vitamin D deficiency     Past Surgical History:  Procedure Laterality Date  . BREAST REDUCTION SURGERY  2001  . CEREBRAL MICROVASCULAR DECOMPRESSION  2006   back of her head  . CHOLECYSTECTOMY    . GALLBLADDER SURGERY  1991   gall stone removed  . MOLE REMOVAL    . REDUCTION MAMMAPLASTY Bilateral 2001    Family History  Problem Relation Age of Onset  . Heart disease Maternal Grandmother        MI  . Stroke Maternal Grandmother   . Diabetes Maternal Grandmother   . Arrhythmia Mother   . Diabetes Mother        maternal grandmother and aunts  . Hypertension Mother   . Heart attack Father   . Stroke Father   . Hyperlipidemia Father   . Hypertension Father   . Cancer Father        lung, smoker  . Heart disease Father        CABG x 4  . Alcoholism Father   . Prostate cancer Maternal Grandfather   . Cancer Maternal Grandfather        colon cancer, prostate  . Alcohol abuse Paternal Grandmother        died of pneumonia  . Heart disease Paternal Grandfather        MI  . Asthma Daughter        allergies  . Hypertension Unknown  parents and maternal parents  . Breast cancer Neg Hx     Social History   Socioeconomic History  . Marital status: Single    Spouse name: Not on file  . Number of children: Not on file  . Years of education: Not on file  . Highest education level: Not on file  Occupational History  . 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 Financial resource strain: Not on file  . Food insecurity:    Worry: Not on file    Inability: Not on file  . Transportation needs:    Medical: Not on file    Non-medical: Not on file  Tobacco Use  . Smoking status: Never Smoker  . Smokeless tobacco: Never Used  Substance and Sexual Activity  . Alcohol use: Yes    Alcohol/week: 0.0 standard drinks    Comment: wine ocassion  . Drug use: No  . Sexual activity: Yes    Birth control/protection: None    Comment: avoid dairy, lives  with husband, daughters. works at school bus driver  Lifestyle  . Physical activity:    Days per week: Not on file    Minutes per session: Not on file  . Stress: Not on file  Relationships  . Social connections:    Talks on phone: Not on file    Gets together: Not on file    Attends religious service: Not on file    Active member of club or organization: Not on file    Attends meetings of clubs or organizations: Not on file    Relationship status: Not on file  . Intimate partner violence:    Fear of current or ex partner: Not on file    Emotionally abused: Not on file    Physically abused: Not on file    Forced sexual activity: Not on file  Other Topics Concern  . Not on file  Social History Narrative  . Not on file    Outpatient Medications Prior to Visit  Medication Sig Dispense Refill  . azelastine (OPTIVAR) 0.05 % ophthalmic solution Place 1 drop into both eyes 2 (two) times daily. 6 mL 1  . BIOTIN PO Take by mouth.    . ezetimibe-simvastatin (VYTORIN) 10-10 MG tablet Take 1 tablet by mouth at bedtime. 90 tablet 1  . hydrochlorothiazide (HYDRODIURIL) 25 MG tablet Take 1 tablet (25 mg total) by mouth daily. 90 tablet 1  . hyoscyamine (LEVSIN SL) 0.125 MG SL tablet Place 1 tablet (0.125 mg total) under the tongue every 4 (four) hours as needed. 30 tablet 1  . Lactobacillus Rhamnosus, GG, (CULTURELLE PO) Take by mouth.    Marland Kitchen. tiZANidine (ZANAFLEX) 4 MG tablet Take 1 tablet (4 mg total) by mouth at bedtime as needed for muscle spasms. 30 tablet 0  . Vitamin D, Ergocalciferol, (DRISDOL) 1.25 MG (50000 UT) CAPS capsule TAKE 1 CAPSULE BY MOUTH ONCE A WEEK 12 capsule 1  . metFORMIN (GLUCOPHAGE XR) 500 MG 24 hr tablet Take 1 tablet (500 mg total) by mouth 2 (two) times daily. 180 tablet 0  . metoprolol succinate (TOPROL-XL) 50 MG 24 hr tablet TAKE 1 TABLET BY MOUTH DAILY WITH OR IMMEDIATELY FOLLOWING A MEAL 90 tablet 1   No facility-administered medications prior to visit.     No  Known Allergies  Review of Systems  Constitutional: Negative for fever and malaise/fatigue.  HENT: Negative for congestion.   Eyes: Negative for blurred vision.  Respiratory: Negative for shortness of breath.  Cardiovascular: Negative for chest pain, palpitations and leg swelling.  Gastrointestinal: Negative for abdominal pain, blood in stool and nausea.  Genitourinary: Negative for dysuria and frequency.  Musculoskeletal: Positive for joint pain and myalgias. Negative for falls.  Skin: Negative for rash.  Neurological: Negative for dizziness, loss of consciousness and headaches.  Endo/Heme/Allergies: Negative for environmental allergies.  Psychiatric/Behavioral: Negative for depression. The patient is not nervous/anxious.        Objective:    Physical Exam Constitutional:      General: She is not in acute distress.    Appearance: Normal appearance. She is not ill-appearing.  HENT:     Head: Normocephalic and atraumatic.     Nose: Nose normal.  Pulmonary:     Effort: Pulmonary effort is normal.  Neurological:     Mental Status: She is alert and oriented to person, place, and time.  Psychiatric:        Mood and Affect: Mood normal.        Behavior: Behavior normal.        Thought Content: Thought content normal.     There were no vitals taken for this visit. Wt Readings from Last 3 Encounters:  11/11/18 234 lb 12.8 oz (106.5 kg)  09/18/18 229 lb (103.9 kg)  09/01/18 229 lb (103.9 kg)    Diabetic Foot Exam - Simple   No data filed     Lab Results  Component Value Date   WBC 7.9 11/11/2018   HGB 13.8 11/11/2018   HCT 41.3 11/11/2018   PLT 369.0 11/11/2018   GLUCOSE 103 (H) 11/11/2018   CHOL 204 (H) 11/11/2018   TRIG 170.0 (H) 11/11/2018   HDL 41.40 11/11/2018   LDLDIRECT 149.0 02/21/2017   LDLCALC 129 (H) 11/11/2018   ALT 21 11/11/2018   AST 17 11/11/2018   NA 140 11/11/2018   K 4.7 11/11/2018   CL 103 11/11/2018   CREATININE 1.01 11/11/2018   BUN 15  11/11/2018   CO2 29 11/11/2018   TSH 1.41 05/01/2018   HGBA1C 7.9 (H) 11/11/2018   MICROALBUR 1.4 02/16/2016    Lab Results  Component Value Date   TSH 1.41 05/01/2018   Lab Results  Component Value Date   WBC 7.9 11/11/2018   HGB 13.8 11/11/2018   HCT 41.3 11/11/2018   MCV 92.6 11/11/2018   PLT 369.0 11/11/2018   Lab Results  Component Value Date   NA 140 11/11/2018   K 4.7 11/11/2018   CO2 29 11/11/2018   GLUCOSE 103 (H) 11/11/2018   BUN 15 11/11/2018   CREATININE 1.01 11/11/2018   BILITOT 0.3 11/11/2018   ALKPHOS 52 11/11/2018   AST 17 11/11/2018   ALT 21 11/11/2018   PROT 6.9 11/11/2018   ALBUMIN 4.1 11/11/2018   CALCIUM 9.9 11/11/2018   ANIONGAP 10 03/22/2015   GFR 70.66 11/11/2018   Lab Results  Component Value Date   CHOL 204 (H) 11/11/2018   Lab Results  Component Value Date   HDL 41.40 11/11/2018   Lab Results  Component Value Date   LDLCALC 129 (H) 11/11/2018   Lab Results  Component Value Date   TRIG 170.0 (H) 11/11/2018   Lab Results  Component Value Date   CHOLHDL 5 11/11/2018   Lab Results  Component Value Date   HGBA1C 7.9 (H) 11/11/2018       Assessment & Plan:   Problem List Items Addressed This Visit    Hyperlipidemia, mixed    Encouraged heart healthy  diet, increase exercise, avoid trans fats      Relevant Medications   metoprolol succinate (TOPROL-XL) 50 MG 24 hr tablet   Essential hypertension    Patient asymptomatic, no changes to meds. Encouraged heart healthy diet such as the DASH diet and exercise as tolerated.       Relevant Medications   metoprolol succinate (TOPROL-XL) 50 MG 24 hr tablet   Vitamin D deficiency    Supplement and monitor      Hip pain, chronic, left    She notes her pain isimproved since last visit. Her hip and thigh had not been bothering much til last night at 3 am when the pain on the lateral aspect o her left thigh returned. After getting up this morning it has resolved again. Likely  related to arthritis or sacroilitis and some level of nerve impingement that exacerbates in certain positions like lying down. Encouraged stretching and topical treatments is worsens again consider PT chiropractic care and/or sports med consult when able      Type 2 diabetes mellitus with hyperglycemia, without long-term current use of insulin (HCC)    hgba1c improved but still mildly elevated, minimize simple carbs. Increase exercise as tolerated. Continue current meds but increase Metformin ER to 500 mg tid and monitor. She notes a low fasting of 133 and a hi not fasting of 165 in the past month. reasses in 3 months      Relevant Medications   metFORMIN (GLUCOPHAGE XR) 500 MG 24 hr tablet   Educated About Covid-19 Virus Infection    Patient still needing to travel for work so educated on strategies to stay safe with cleaning and social distancing as well as Vitamin C, Zinc and sodium bicarbonate use.          I have changed Anikka D. Meyn's metFORMIN. I am also having her maintain her hydrochlorothiazide, hyoscyamine, azelastine, BIOTIN PO, (Lactobacillus Rhamnosus, GG, (CULTURELLE PO)), Vitamin D (Ergocalciferol), tiZANidine, ezetimibe-simvastatin, and metoprolol succinate.  Meds ordered this encounter  Medications  . metoprolol succinate (TOPROL-XL) 50 MG 24 hr tablet    Sig: TAKE 1 TABLET BY MOUTH DAILY WITH OR IMMEDIATELY FOLLOWING A MEAL    Dispense:  90 tablet    Refill:  1  . metFORMIN (GLUCOPHAGE XR) 500 MG 24 hr tablet    Sig: Take 1 tablet (500 mg total) by mouth 3 (three) times daily.    Dispense:  270 tablet    Refill:  1    D/c previous script    I discussed the assessment and treatment plan with the patient. The patient was provided an opportunity to ask questions and all were answered. The patient agreed with the plan and demonstrated an understanding of the instructions.   The patient was advised to call back or seek an in-person evaluation if the symptoms  worsen or if the condition fails to improve as anticipated.  I provided 25 minutes of non-face-to-face time during this encounter.   Danise Edge, MD

## 2019-01-07 NOTE — Assessment & Plan Note (Signed)
Patient still needing to travel for work so educated on strategies to stay safe with cleaning and social distancing as well as Vitamin C, Zinc and sodium bicarbonate use.

## 2019-01-07 NOTE — Assessment & Plan Note (Addendum)
Patient asymptomatic, no changes to meds. Encouraged heart healthy diet such as the DASH diet and exercise as tolerated.

## 2019-01-07 NOTE — Assessment & Plan Note (Signed)
Supplement and monitor 

## 2019-01-07 NOTE — Assessment & Plan Note (Signed)
She notes her pain isimproved since last visit. Her hip and thigh had not been bothering much til last night at 3 am when the pain on the lateral aspect o her left thigh returned. After getting up this morning it has resolved again. Likely related to arthritis or sacroilitis and some level of nerve impingement that exacerbates in certain positions like lying down. Encouraged stretching and topical treatments is worsens again consider PT chiropractic care and/or sports med consult when able

## 2019-01-07 NOTE — Assessment & Plan Note (Signed)
hgba1c improved but still mildly elevated, minimize simple carbs. Increase exercise as tolerated. Continue current meds but increase Metformin ER to 500 mg tid and monitor. She notes a low fasting of 133 and a hi not fasting of 165 in the past month. reasses in 3 months

## 2019-01-07 NOTE — Assessment & Plan Note (Signed)
Encouraged heart healthy diet, increase exercise, avoid trans fats 

## 2019-01-08 ENCOUNTER — Ambulatory Visit: Payer: PRIVATE HEALTH INSURANCE | Admitting: Family Medicine

## 2019-02-16 ENCOUNTER — Encounter: Payer: Self-pay | Admitting: Family Medicine

## 2019-03-31 ENCOUNTER — Telehealth: Payer: Self-pay | Admitting: Family Medicine

## 2019-03-31 NOTE — Telephone Encounter (Signed)
OK we can discuss what else to add at appointment I believe she has an appt next week. Just confirm

## 2019-03-31 NOTE — Telephone Encounter (Signed)
3 a day of metformin is messing up stomach so she has gone back to 2 pills a day.

## 2019-04-01 NOTE — Telephone Encounter (Signed)
Patient has appt on 7/24 with pcp

## 2019-04-09 ENCOUNTER — Other Ambulatory Visit: Payer: Self-pay

## 2019-04-09 ENCOUNTER — Ambulatory Visit: Payer: PRIVATE HEALTH INSURANCE | Admitting: Family Medicine

## 2019-04-09 ENCOUNTER — Ambulatory Visit (INDEPENDENT_AMBULATORY_CARE_PROVIDER_SITE_OTHER): Payer: PRIVATE HEALTH INSURANCE | Admitting: Family Medicine

## 2019-04-09 ENCOUNTER — Telehealth: Payer: Self-pay

## 2019-04-09 DIAGNOSIS — E559 Vitamin D deficiency, unspecified: Secondary | ICD-10-CM | POA: Diagnosis not present

## 2019-04-09 DIAGNOSIS — E669 Obesity, unspecified: Secondary | ICD-10-CM

## 2019-04-09 DIAGNOSIS — E782 Mixed hyperlipidemia: Secondary | ICD-10-CM | POA: Diagnosis not present

## 2019-04-09 DIAGNOSIS — E1169 Type 2 diabetes mellitus with other specified complication: Secondary | ICD-10-CM

## 2019-04-09 DIAGNOSIS — I10 Essential (primary) hypertension: Secondary | ICD-10-CM

## 2019-04-09 DIAGNOSIS — E1165 Type 2 diabetes mellitus with hyperglycemia: Secondary | ICD-10-CM | POA: Diagnosis not present

## 2019-04-09 NOTE — Assessment & Plan Note (Signed)
Encouraged heart healthy diet, increase exercise, avoid trans fats, consider a krill oil cap daily 

## 2019-04-09 NOTE — Assessment & Plan Note (Signed)
Has not tolerated Metformin at tid, she will drop to bid which she tolerated and after labs may consider further changes.minimize simple carbs. Increase exercise as tolerated.

## 2019-04-09 NOTE — Assessment & Plan Note (Signed)
Encouraged to check weekly vitals, no changes to meds. Encouraged heart healthy diet such as the DASH diet and exercise as tolerated.  

## 2019-04-09 NOTE — Progress Notes (Signed)
Virtual Visit via Video Note  I connected with Chelsea EbbsJacquetta D Ulloa on 04/09/19 at  2:20 PM EDT by a video enabled telemedicine application and verified that I am speaking with the correct person using two identifiers.  Location: Patient: home Provider: home   I discussed the limitations of evaluation and management by telemedicine and the availability of in person appointments. The patient expressed understanding and agreed to proceed. Crissie SicklesPrincess Carter, CMA was able to get the patient set up on visit, video    Subjective:    Patient ID: Chelsea Bray, female    DOB: 03-22-1971, 48 y.o.   MRN: [id number]  No chief complaint on file.   HPI Patient is in today for follow-up on chronic medical concerns including hyperlipidemia, diabetes, hypertension and more.  She has been trying to maintain quarantine and trying to eat well and stay active.  She does note she is not tolerating metformin at 3 times daily dosing.  She had to drop about 2 weeks ago to twice daily dosing again because she was experiencing nausea so bad that she had a couple of episodes of vomiting.  She is dropping back to twice daily she is tolerated the medication better again.  No polyuria or polydipsia. Denies CP/palp/SOB/HA/congestion/fevers/GI or GU c/o. Taking meds as prescribed   Past Medical History:  Diagnosis Date  . Acid reflux disease   . Arthritis   . Colon polyps 02/21/2017  . Diabetes (HCC) 11/17/2011  . Diabetes mellitus type 2 in obese (HCC) 11/17/2011  . History of esophageal stricture 02/21/2017  . Hyperlipidemia   . Hypertension   . IBS (irritable bowel syndrome)   . Insomnia 03/08/2016  . Lactose intolerance   . Morbid obesity (HCC) 01/10/2013  . Pain in joint, upper arm 11/08/2015  . Preventative health care 11/29/2013  . Sleep apnea   . Swelling of both lower extremities   . TOS (thoracic outlet syndrome) 11/20/2015  . Vaginitis and vulvovaginitis 12/23/2014  . Vitamin D deficiency     Past  Surgical History:  Procedure Laterality Date  . BREAST REDUCTION SURGERY  2001  . CEREBRAL MICROVASCULAR DECOMPRESSION  2006   back of her head  . CHOLECYSTECTOMY    . GALLBLADDER SURGERY  1991   gall stone removed  . MOLE REMOVAL    . REDUCTION MAMMAPLASTY Bilateral 2001    Family History  Problem Relation Age of Onset  . Heart disease Maternal Grandmother        MI  . Stroke Maternal Grandmother   . Diabetes Maternal Grandmother   . Arrhythmia Mother   . Diabetes Mother        maternal grandmother and aunts  . Hypertension Mother   . Heart attack Father   . Stroke Father   . Hyperlipidemia Father   . Hypertension Father   . Cancer Father        lung, smoker  . Heart disease Father        CABG x 4  . Alcoholism Father   . Prostate cancer Maternal Grandfather   . Cancer Maternal Grandfather        colon cancer, prostate  . Alcohol abuse Paternal Grandmother        died of pneumonia  . Heart disease Paternal Grandfather        MI  . Asthma Daughter        allergies  . Hypertension Unknown        parents and maternal parents  .  Breast cancer Neg Hx     Social History   Socioeconomic History  . Marital status: Single    Spouse name: Not on file  . Number of children: Not on file  . Years of education: Not on file  . Highest education level: Not on file  Occupational History  . 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 Financial resource strain: Not on file  . Food insecurity    Worry: Not on file    Inability: Not on file  . Transportation needs    Medical: Not on file    Non-medical: Not on file  Tobacco Use  . Smoking status: Never Smoker  . Smokeless tobacco: Never Used  Substance and Sexual Activity  . Alcohol use: Yes    Alcohol/week: 0.0 standard drinks    Comment: wine ocassion  . Drug use: No  . Sexual activity: Yes    Birth control/protection: None    Comment: avoid dairy, lives with husband, daughters. works at school bus driver   Lifestyle  . Physical activity    Days per week: Not on file    Minutes per session: Not on file  . Stress: Not on file  Relationships  . Social Musicianconnections    Talks on phone: Not on file    Gets together: Not on file    Attends religious service: Not on file    Active member of club or organization: Not on file    Attends meetings of clubs or organizations: Not on file    Relationship status: Not on file  . Intimate partner violence    Fear of current or ex partner: Not on file    Emotionally abused: Not on file    Physically abused: Not on file    Forced sexual activity: Not on file  Other Topics Concern  . Not on file  Social History Narrative  . Not on file    Outpatient Medications Prior to Visit  Medication Sig Dispense Refill  . azelastine (OPTIVAR) 0.05 % ophthalmic solution Place 1 drop into both eyes 2 (two) times daily. 6 mL 1  . BIOTIN PO Take by mouth.    . ezetimibe-simvastatin (VYTORIN) 10-10 MG tablet Take 1 tablet by mouth at bedtime. 90 tablet 1  . hydrochlorothiazide (HYDRODIURIL) 25 MG tablet Take 1 tablet (25 mg total) by mouth daily. 90 tablet 1  . hyoscyamine (LEVSIN SL) 0.125 MG SL tablet Place 1 tablet (0.125 mg total) under the tongue every 4 (four) hours as needed. 30 tablet 1  . Lactobacillus Rhamnosus, GG, (CULTURELLE PO) Take by mouth.    . metFORMIN (GLUCOPHAGE XR) 500 MG 24 hr tablet Take 1 tablet (500 mg total) by mouth 3 (three) times daily. 270 tablet 1  . metoprolol succinate (TOPROL-XL) 50 MG 24 hr tablet TAKE 1 TABLET BY MOUTH DAILY WITH OR IMMEDIATELY FOLLOWING A MEAL 90 tablet 1  . tiZANidine (ZANAFLEX) 4 MG tablet Take 1 tablet (4 mg total) by mouth at bedtime as needed for muscle spasms. 30 tablet 0  . Vitamin D, Ergocalciferol, (DRISDOL) 1.25 MG (50000 UT) CAPS capsule TAKE 1 CAPSULE BY MOUTH ONCE A WEEK 12 capsule 1   No facility-administered medications prior to visit.     No Known Allergies  Review of Systems  Constitutional:  Negative for fever and malaise/fatigue.  HENT: Negative for congestion.   Eyes: Negative for blurred vision.  Respiratory: Negative for shortness of breath.   Cardiovascular: Negative for chest pain, palpitations  and leg swelling.  Gastrointestinal: Negative for abdominal pain, blood in stool and nausea.  Genitourinary: Negative for dysuria and frequency.  Musculoskeletal: Negative for falls.  Skin: Negative for rash.  Neurological: Negative for dizziness, loss of consciousness and headaches.  Endo/Heme/Allergies: Negative for environmental allergies.  Psychiatric/Behavioral: Negative for depression. The patient is not nervous/anxious.        Objective:    Physical Exam Constitutional:      Appearance: Normal appearance. She is obese. She is not ill-appearing.  HENT:     Head: Normocephalic and atraumatic.  Eyes:     General:        Right eye: No discharge.        Left eye: No discharge.  Pulmonary:     Effort: Pulmonary effort is normal.  Neurological:     Mental Status: She is alert and oriented to person, place, and time.  Psychiatric:        Mood and Affect: Mood normal.        Behavior: Behavior normal.     There were no vitals taken for this visit. Wt Readings from Last 3 Encounters:  11/11/18 234 lb 12.8 oz (106.5 kg)  09/18/18 229 lb (103.9 kg)  09/01/18 229 lb (103.9 kg)    Diabetic Foot Exam - Simple   No data filed     Lab Results  Component Value Date   WBC 7.9 11/11/2018   HGB 13.8 11/11/2018   HCT 41.3 11/11/2018   PLT 369.0 11/11/2018   GLUCOSE 103 (H) 11/11/2018   CHOL 204 (H) 11/11/2018   TRIG 170.0 (H) 11/11/2018   HDL 41.40 11/11/2018   LDLDIRECT 149.0 02/21/2017   LDLCALC 129 (H) 11/11/2018   ALT 21 11/11/2018   AST 17 11/11/2018   NA 140 11/11/2018   K 4.7 11/11/2018   CL 103 11/11/2018   CREATININE 1.01 11/11/2018   BUN 15 11/11/2018   CO2 29 11/11/2018   TSH 1.41 05/01/2018   HGBA1C 7.9 (H) 11/11/2018   MICROALBUR 1.4  02/16/2016    Lab Results  Component Value Date   TSH 1.41 05/01/2018   Lab Results  Component Value Date   WBC 7.9 11/11/2018   HGB 13.8 11/11/2018   HCT 41.3 11/11/2018   MCV 92.6 11/11/2018   PLT 369.0 11/11/2018   Lab Results  Component Value Date   NA 140 11/11/2018   K 4.7 11/11/2018   CO2 29 11/11/2018   GLUCOSE 103 (H) 11/11/2018   BUN 15 11/11/2018   CREATININE 1.01 11/11/2018   BILITOT 0.3 11/11/2018   ALKPHOS 52 11/11/2018   AST 17 11/11/2018   ALT 21 11/11/2018   PROT 6.9 11/11/2018   ALBUMIN 4.1 11/11/2018   CALCIUM 9.9 11/11/2018   ANIONGAP 10 03/22/2015   GFR 70.66 11/11/2018   Lab Results  Component Value Date   CHOL 204 (H) 11/11/2018   Lab Results  Component Value Date   HDL 41.40 11/11/2018   Lab Results  Component Value Date   LDLCALC 129 (H) 11/11/2018   Lab Results  Component Value Date   TRIG 170.0 (H) 11/11/2018   Lab Results  Component Value Date   CHOLHDL 5 11/11/2018   Lab Results  Component Value Date   HGBA1C 7.9 (H) 11/11/2018       Assessment & Plan:   Problem List Items Addressed This Visit    Hyperlipidemia, mixed    Encouraged heart healthy diet, increase exercise, avoid trans fats, consider a krill  oil cap daily      Relevant Orders   Lipid panel   Lipid panel   Diabetes mellitus type 2 in obese (HCC)    Has not tolerated Metformin at tid, she will drop to bid which she tolerated and after labs may consider further changes.minimize simple carbs. Increase exercise as tolerated.       Essential hypertension    Encouraged to check weekly vitals, no changes to meds. Encouraged heart healthy diet such as the DASH diet and exercise as tolerated.       Relevant Orders   CBC   Comprehensive metabolic panel   TSH   CBC   Comprehensive metabolic panel   TSH   Vitamin D deficiency   Relevant Orders   VITAMIN D 25 Hydroxy (Vit-D Deficiency, Fractures)   VITAMIN D 25 Hydroxy (Vit-D Deficiency, Fractures)    Type 2 diabetes mellitus with hyperglycemia, without long-term current use of insulin ([place]) - Primary   Relevant Orders   Hemoglobin A1c   Hemoglobin A1c      I am having [name] maintain her hydrochlorothiazide, hyoscyamine, azelastine, BIOTIN PO, (Lactobacillus Rhamnosus, GG, (CULTURELLE PO)), Vitamin D (Ergocalciferol), tiZANidine, ezetimibe-simvastatin, metoprolol succinate, and metFORMIN.  No orders of the defined types were placed in this encounter.   I discussed the assessment and treatment plan with the patient. The patient was provided an opportunity to ask questions and all were answered. The patient agreed with the plan and demonstrated an understanding of the instructions.   The patient was advised to call back or seek an in-person evaluation if the symptoms worsen or if the condition fails to improve as anticipated.  I provided 25 minutes of non-face-to-face time during this encounter.   Penni Homans, MD

## 2019-04-09 NOTE — Telephone Encounter (Signed)
Patient needs a lab appt next week then  Another lab appt 3 months after that then a visit with PCP a few days later. Please the second lab is scheduled 3 months and 1 day after the first lab appt has been scheduled to avoid her insurance denial

## 2019-04-10 ENCOUNTER — Ambulatory Visit: Payer: PRIVATE HEALTH INSURANCE | Admitting: Family Medicine

## 2019-04-16 ENCOUNTER — Other Ambulatory Visit: Payer: PRIVATE HEALTH INSURANCE

## 2019-04-17 ENCOUNTER — Other Ambulatory Visit (INDEPENDENT_AMBULATORY_CARE_PROVIDER_SITE_OTHER): Payer: PRIVATE HEALTH INSURANCE

## 2019-04-17 ENCOUNTER — Other Ambulatory Visit: Payer: Self-pay

## 2019-04-17 DIAGNOSIS — E1165 Type 2 diabetes mellitus with hyperglycemia: Secondary | ICD-10-CM | POA: Diagnosis not present

## 2019-04-17 DIAGNOSIS — I10 Essential (primary) hypertension: Secondary | ICD-10-CM

## 2019-04-17 DIAGNOSIS — E559 Vitamin D deficiency, unspecified: Secondary | ICD-10-CM

## 2019-04-17 DIAGNOSIS — E782 Mixed hyperlipidemia: Secondary | ICD-10-CM

## 2019-04-17 NOTE — Addendum Note (Signed)
Addended by: Kelle Darting A on: 04/17/2019 02:40 PM   Modules accepted: Orders

## 2019-04-18 LAB — COMPREHENSIVE METABOLIC PANEL
AG Ratio: 1.6 (calc) (ref 1.0–2.5)
ALT: 23 U/L (ref 6–29)
AST: 15 U/L (ref 10–35)
Albumin: 4.1 g/dL (ref 3.6–5.1)
Alkaline phosphatase (APISO): 50 U/L (ref 31–125)
BUN: 10 mg/dL (ref 7–25)
CO2: 19 mmol/L — ABNORMAL LOW (ref 20–32)
Calcium: 9.3 mg/dL (ref 8.6–10.2)
Chloride: 104 mmol/L (ref 98–110)
Creat: 0.92 mg/dL (ref 0.50–1.10)
Globulin: 2.6 g/dL (calc) (ref 1.9–3.7)
Glucose, Bld: 187 mg/dL — ABNORMAL HIGH (ref 65–99)
Potassium: 4.1 mmol/L (ref 3.5–5.3)
Sodium: 138 mmol/L (ref 135–146)
Total Bilirubin: 0.3 mg/dL (ref 0.2–1.2)
Total Protein: 6.7 g/dL (ref 6.1–8.1)

## 2019-04-18 LAB — CBC
HCT: 38.9 % (ref 35.0–45.0)
Hemoglobin: 13.8 g/dL (ref 11.7–15.5)
MCH: 31.7 pg (ref 27.0–33.0)
MCHC: 35.5 g/dL (ref 32.0–36.0)
MCV: 89.4 fL (ref 80.0–100.0)
MPV: 10.3 fL (ref 7.5–12.5)
Platelets: 370 10*3/uL (ref 140–400)
RBC: 4.35 10*6/uL (ref 3.80–5.10)
RDW: 13.6 % (ref 11.0–15.0)
WBC: 7.3 10*3/uL (ref 3.8–10.8)

## 2019-04-18 LAB — LIPID PANEL
Cholesterol: 199 mg/dL (ref <200)
HDL: 43 mg/dL — ABNORMAL LOW (ref >=50)
LDL Cholesterol (Calc): 131 mg/dL (calc) — ABNORMAL HIGH
Non-HDL Cholesterol (Calc): 156 mg/dL (calc) — ABNORMAL HIGH (ref <130)
Total CHOL/HDL Ratio: 4.6 (calc) (ref <5.0)
Triglycerides: 141 mg/dL (ref <150)

## 2019-04-18 LAB — HEMOGLOBIN A1C
Hgb A1c MFr Bld: 7 % of total Hgb — ABNORMAL HIGH (ref <5.7)
Mean Plasma Glucose: 154 (calc)
eAG (mmol/L): 8.5 (calc)

## 2019-04-18 LAB — VITAMIN D 25 HYDROXY (VIT D DEFICIENCY, FRACTURES): Vit D, 25-Hydroxy: 71 ng/mL (ref 30–100)

## 2019-04-18 LAB — TSH: TSH: 1.46 mIU/L

## 2019-04-28 MED ORDER — ATORVASTATIN CALCIUM 10 MG PO TABS: 10.0000 mg | ORAL_TABLET | Freq: Every day | ORAL | 3 refills | Status: DC

## 2019-04-28 NOTE — Addendum Note (Signed)
Addended by: Magdalene Molly A on: 04/28/2019 02:27 PM   Modules accepted: Orders

## 2019-05-20 ENCOUNTER — Encounter: Payer: Self-pay | Admitting: Family Medicine

## 2019-05-20 DIAGNOSIS — Z1211 Encounter for screening for malignant neoplasm of colon: Secondary | ICD-10-CM

## 2019-05-20 DIAGNOSIS — Z6838 Body mass index (BMI) 38.0-38.9, adult: Secondary | ICD-10-CM

## 2019-05-20 DIAGNOSIS — G4733 Obstructive sleep apnea (adult) (pediatric): Secondary | ICD-10-CM

## 2019-05-20 DIAGNOSIS — E1169 Type 2 diabetes mellitus with other specified complication: Secondary | ICD-10-CM

## 2019-05-20 DIAGNOSIS — E669 Obesity, unspecified: Secondary | ICD-10-CM

## 2019-05-20 DIAGNOSIS — Z124 Encounter for screening for malignant neoplasm of cervix: Secondary | ICD-10-CM

## 2019-05-29 MED ORDER — METFORMIN HCL ER 500 MG PO TB24: 500.0000 mg | ORAL_TABLET | Freq: Three times a day (TID) | ORAL | 1 refills | Status: DC

## 2019-05-29 NOTE — Telephone Encounter (Signed)
Orders placed  Left voicemail for patient to call the office back

## 2019-05-29 NOTE — Addendum Note (Signed)
Addended by: Magdalene Molly A on: 05/29/2019 04:33 PM   Modules accepted: Orders

## 2019-06-02 ENCOUNTER — Encounter: Payer: Self-pay | Admitting: Internal Medicine

## 2019-06-25 ENCOUNTER — Other Ambulatory Visit: Payer: Self-pay | Admitting: Family Medicine

## 2019-07-10 ENCOUNTER — Encounter: Payer: PRIVATE HEALTH INSURANCE | Admitting: Internal Medicine

## 2019-07-13 ENCOUNTER — Encounter: Payer: PRIVATE HEALTH INSURANCE | Admitting: Obstetrics & Gynecology

## 2019-07-20 ENCOUNTER — Other Ambulatory Visit: Payer: PRIVATE HEALTH INSURANCE

## 2019-08-03 ENCOUNTER — Other Ambulatory Visit: Payer: Self-pay

## 2019-08-03 ENCOUNTER — Ambulatory Visit (AMBULATORY_SURGERY_CENTER): Payer: No Typology Code available for payment source | Admitting: *Deleted

## 2019-08-03 VITALS — Temp 97.4°F | Ht 65.0 in | Wt 239.4 lb

## 2019-08-03 DIAGNOSIS — Z1159 Encounter for screening for other viral diseases: Secondary | ICD-10-CM

## 2019-08-03 DIAGNOSIS — Z86010 Personal history of colonic polyps: Secondary | ICD-10-CM

## 2019-08-03 MED ORDER — NA SULFATE-K SULFATE-MG SULF 17.5-3.13-1.6 GM/177ML PO SOLN: 1.0000 | Freq: Once | ORAL | 0 refills | Status: AC

## 2019-08-03 NOTE — Progress Notes (Signed)
No egg or soy allergy known to patient  No issues with past sedation with any surgeries  or procedures, no intubation problems  No diet pills per patient No home 02 use per patient  No blood thinners per patient  Pt denies issues with constipation  No A fib or A flutter  EMMI video sent to pt's e mail   Due to the COVID-19 pandemic we are asking patients to follow these guidelines. Please only bring one care partner. Please be aware that your care partner may wait in the car in the parking lot or if they feel like they will be too hot to wait in the car, they may wait in the lobby on the 4th floor. All care partners are required to wear a mask the entire time (we do not have any that we can provide them), they need to practice social distancing, and we will do a Covid check for all patient's and care partners when you arrive. Also we will check their temperature and your temperature. If the care partner waits in their car they need to stay in the parking lot the entire time and we will call them on their cell phone when the patient is ready for discharge so they can bring the car to the front of the building. Also all patient's will need to wear a mask into building.  covid screening 08/19/19,10:10 am  suprep coupon provided.

## 2019-08-11 ENCOUNTER — Encounter: Payer: PRIVATE HEALTH INSURANCE | Admitting: Internal Medicine

## 2019-08-24 ENCOUNTER — Encounter: Payer: PRIVATE HEALTH INSURANCE | Admitting: Internal Medicine

## 2019-09-07 ENCOUNTER — Ambulatory Visit (HOSPITAL_BASED_OUTPATIENT_CLINIC_OR_DEPARTMENT_OTHER)
Admission: RE | Admit: 2019-09-07 | Discharge: 2019-09-07 | Disposition: A | Discharge status: Home / Self Care | Payer: No Typology Code available for payment source | Source: Ambulatory Visit | Attending: Family Medicine | Admitting: Family Medicine

## 2019-09-07 ENCOUNTER — Other Ambulatory Visit (HOSPITAL_BASED_OUTPATIENT_CLINIC_OR_DEPARTMENT_OTHER): Payer: Self-pay | Admitting: Family Medicine

## 2019-09-07 ENCOUNTER — Other Ambulatory Visit: Payer: Self-pay

## 2019-09-07 ENCOUNTER — Encounter (HOSPITAL_BASED_OUTPATIENT_CLINIC_OR_DEPARTMENT_OTHER): Payer: Self-pay

## 2019-09-07 DIAGNOSIS — Z1231 Encounter for screening mammogram for malignant neoplasm of breast: Secondary | ICD-10-CM | POA: Insufficient documentation

## 2019-09-30 ENCOUNTER — Ambulatory Visit: Payer: Self-pay

## 2019-09-30 NOTE — Telephone Encounter (Signed)
Returned call to patient who states she has developed an cyst type lump on her left back near her shoulder.  She states that it has a hard center and soft edges. She states that it is 4-5 inches across. It is painful to touch.  She just noticed the area Sunday. She states it does not itch but feels slightly warn. Per protocol patient will go to UC for evaluation of the lump. Care advice read to patient.  She verbalized understanding of all information. 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  She already has appointment scheduled Monday with DR Abner Greenspan.  Reason for Disposition . [1] Swelling is red AND [2] size > 2 inches (5.0 cm) (Exception: itchy area of skin)  Answer Assessment - Initial Assessment Questions 1. APPEARANCE of SWELLING: "What does it look like?" (e.g., lymph node, insect bite, mole)     Cyst 3-4 inches wide 2. SIZE: "How large is the swelling?" (inches, cm or compare to coins)     3-4 inches wide hard in middle 3. LOCATION: "Where is the swelling located?"   Back left near shoulder 4. ONSET: "When did the swelling start?"    Sunday 5. PAIN: "Is it painful?" If so, ask: "How much?"     Yes if touched 6. ITCH: "Does it itch?" If so, ask: "How much?"    no 7. CAUSE: "What do you think caused the swelling?"     cyst 8. OTHER SYMPTOMS: "Do you have any other symptoms?" (e.g., fever)     no  Protocols used: SKIN LUMP OR LOCALIZED SWELLING-A-AH

## 2019-10-05 ENCOUNTER — Encounter: Payer: Self-pay | Admitting: Family Medicine

## 2019-10-05 ENCOUNTER — Ambulatory Visit (INDEPENDENT_AMBULATORY_CARE_PROVIDER_SITE_OTHER): Payer: 59 | Admitting: Family Medicine

## 2019-10-05 ENCOUNTER — Encounter: Payer: Self-pay | Admitting: Internal Medicine

## 2019-10-05 ENCOUNTER — Other Ambulatory Visit: Payer: Self-pay

## 2019-10-05 VITALS — BP 132/90 | HR 70 | Temp 98.1°F | Resp 18 | Wt 238.6 lb

## 2019-10-05 DIAGNOSIS — G540 Brachial plexus disorders: Secondary | ICD-10-CM

## 2019-10-05 DIAGNOSIS — L0291 Cutaneous abscess, unspecified: Secondary | ICD-10-CM | POA: Insufficient documentation

## 2019-10-05 DIAGNOSIS — E1169 Type 2 diabetes mellitus with other specified complication: Secondary | ICD-10-CM | POA: Diagnosis not present

## 2019-10-05 DIAGNOSIS — K635 Polyp of colon: Secondary | ICD-10-CM

## 2019-10-05 DIAGNOSIS — E669 Obesity, unspecified: Secondary | ICD-10-CM | POA: Diagnosis not present

## 2019-10-05 DIAGNOSIS — E559 Vitamin D deficiency, unspecified: Secondary | ICD-10-CM | POA: Diagnosis not present

## 2019-10-05 DIAGNOSIS — E1165 Type 2 diabetes mellitus with hyperglycemia: Secondary | ICD-10-CM

## 2019-10-05 DIAGNOSIS — M2550 Pain in unspecified joint: Secondary | ICD-10-CM

## 2019-10-05 DIAGNOSIS — R197 Diarrhea, unspecified: Secondary | ICD-10-CM | POA: Insufficient documentation

## 2019-10-05 DIAGNOSIS — M21611 Bunion of right foot: Secondary | ICD-10-CM

## 2019-10-05 DIAGNOSIS — M21612 Bunion of left foot: Secondary | ICD-10-CM

## 2019-10-05 DIAGNOSIS — R109 Unspecified abdominal pain: Secondary | ICD-10-CM

## 2019-10-05 DIAGNOSIS — E782 Mixed hyperlipidemia: Secondary | ICD-10-CM

## 2019-10-05 DIAGNOSIS — Z8719 Personal history of other diseases of the digestive system: Secondary | ICD-10-CM | POA: Diagnosis not present

## 2019-10-05 DIAGNOSIS — I10 Essential (primary) hypertension: Secondary | ICD-10-CM

## 2019-10-05 LAB — COMPREHENSIVE METABOLIC PANEL
ALT: 20 U/L (ref 0–35)
AST: 16 U/L (ref 0–37)
Albumin: 4.2 g/dL (ref 3.5–5.2)
Alkaline Phosphatase: 57 U/L (ref 39–117)
BUN: 11 mg/dL (ref 6–23)
CO2: 24 mEq/L (ref 19–32)
Calcium: 9.7 mg/dL (ref 8.4–10.5)
Chloride: 104 mEq/L (ref 96–112)
Creatinine, Ser: 0.84 mg/dL (ref 0.40–1.20)
GFR: 87.08 mL/min (ref >60.00)
Glucose, Bld: 188 mg/dL — ABNORMAL HIGH (ref 70–99)
Potassium: 4.4 mEq/L (ref 3.5–5.1)
Sodium: 138 mEq/L (ref 135–145)
Total Bilirubin: 0.4 mg/dL (ref 0.2–1.2)
Total Protein: 7.1 g/dL (ref 6.0–8.3)

## 2019-10-05 LAB — CBC
HCT: 41.8 % (ref 36.0–46.0)
Hemoglobin: 13.8 g/dL (ref 12.0–15.0)
MCHC: 32.9 g/dL (ref 30.0–36.0)
MCV: 93.8 fl (ref 78.0–100.0)
Platelets: 383 10*3/uL (ref 150.0–400.0)
RBC: 4.46 Mil/uL (ref 3.87–5.11)
RDW: 14.1 % (ref 11.5–15.5)
WBC: 6.7 10*3/uL (ref 4.0–10.5)

## 2019-10-05 LAB — TSH: TSH: 0.86 u[IU]/mL (ref 0.35–4.50)

## 2019-10-05 LAB — HEMOGLOBIN A1C: Hgb A1c MFr Bld: 8.2 % — ABNORMAL HIGH (ref 4.6–6.5)

## 2019-10-05 LAB — VITAMIN D 25 HYDROXY (VIT D DEFICIENCY, FRACTURES): VITD: 77.46 ng/mL (ref 30.00–100.00)

## 2019-10-05 LAB — SEDIMENTATION RATE: Sed Rate: 71 mm/hr — ABNORMAL HIGH (ref 0–20)

## 2019-10-05 LAB — LIPID PANEL
Cholesterol: 206 mg/dL — ABNORMAL HIGH (ref 0–200)
HDL: 42.3 mg/dL (ref >39.00)
LDL Cholesterol: 140 mg/dL — ABNORMAL HIGH (ref 0–99)
NonHDL: 164.05
Total CHOL/HDL Ratio: 5
Triglycerides: 118 mg/dL (ref 0.0–149.0)
VLDL: 23.6 mg/dL (ref 0.0–40.0)

## 2019-10-05 MED ORDER — NAPROXEN 375 MG PO TABS: 375.0000 mg | ORAL_TABLET | Freq: Two times a day (BID) | ORAL | 1 refills | Status: AC

## 2019-10-05 MED ORDER — DIPHENOXYLATE-ATROPINE 2.5-0.025 MG PO TABS: 1.0000 | ORAL_TABLET | Freq: Four times a day (QID) | ORAL | 1 refills | Status: DC | PRN

## 2019-10-05 MED ORDER — CEPHALEXIN 500 MG PO CAPS: 500.0000 mg | ORAL_CAPSULE | Freq: Four times a day (QID) | ORAL | 0 refills | Status: DC

## 2019-10-05 MED ORDER — HYOSCYAMINE SULFATE 0.125 MG SL SUBL: 0.1250 mg | SUBLINGUAL_TABLET | SUBLINGUAL | 1 refills | Status: AC | PRN

## 2019-10-05 NOTE — Assessment & Plan Note (Signed)
Tolerating statin, encouraged heart healthy diet, avoid trans fats, minimize simple carbs and saturated fats. Increase exercise as tolerated 

## 2019-10-05 NOTE — Progress Notes (Signed)
Subjective:    Patient ID: Chelsea Bray, female    DOB: Jan 22, 1971, 49 y.o.   MRN: [id number]  No chief complaint on file.   HPI Patient is in today for evaluation of multiple concerns.  Her first complaint is of an increase in stool frequency over the last 1 to 2 months.  She has been on Metformin at 3 times daily for quite some time and was not initially having trouble.  She does note she does not tolerate 1000 at once but if she takes 500 3 times daily she felt like she was tolerating it.  Then in the last couple of months she is had increasing frequency to the point where she is having 5-6 loose bowel movements daily.  It darkens when she uses Pepto but is not generally dark.  Imodium helps partially.  She does have abdominal cramping sometimes in the epigastrium and sometimes down lower.  She did hold her Metformin and her diarrhea did not resolve.  She did have her gallbladder out back in 1991 and has had some level of trouble with loose stools since but this is significantly worse.  She is also noting an abscess which is worsening on her left posterior shoulder this past week or 2.  It had been a very small cystic structure with a blackhead until her mother tried to lance it and it has erupted into a painful swollen abscess which good drain when they manipulated it.  No fevers or chills.  She also notes increased pain in her feet she has had bunions for years and is now ready to be evaluated by podiatry for possible surgery.  Her final complaint is of diffuse pain.  She has an ongoing history of neck pain and some left upper extremity pain all the way into the hand and also is having trouble with the right hand.  Notes trouble with hips and legs as well.  No recent fall or injury. Denies CP/palp/SOB/HA/congestion/fevers/GI or GU c/o. Taking meds as prescribed  Past Medical History:  Diagnosis Date  . Acid reflux disease   . Arthritis   . Colon polyps 02/21/2017  . Diabetes (HCC) 11/17/2011   . Diabetes mellitus type 2 in obese (HCC) 11/17/2011  . History of esophageal stricture 02/21/2017  . Hyperlipidemia   . Hypertension   . IBS (irritable bowel syndrome)   . Insomnia 03/08/2016  . Lactose intolerance   . Morbid obesity (HCC) 01/10/2013  . Pain in joint, upper arm 11/08/2015  . Preventative health care 11/29/2013  . Sleep apnea    c pap  . Swelling of both lower extremities   . TOS (thoracic outlet syndrome) 11/20/2015  . Vaginitis and vulvovaginitis 12/23/2014  . Vitamin D deficiency     Past Surgical History:  Procedure Laterality Date  . BREAST REDUCTION SURGERY  2001  . CEREBRAL MICROVASCULAR DECOMPRESSION  2006   back of her head  . CHOLECYSTECTOMY    . COLONOSCOPY    . GALLBLADDER SURGERY  1991   gall stone removed  . MOLE REMOVAL    . POLYPECTOMY    . REDUCTION MAMMAPLASTY Bilateral 2001    Family History  Problem Relation Age of Onset  . Heart disease Maternal Grandmother        MI  . Stroke Maternal Grandmother   . Diabetes Maternal Grandmother   . Arrhythmia Mother   . Diabetes Mother        maternal grandmother and aunts  . Hypertension Mother   .  Colon polyps Mother   . Heart attack Father   . Stroke Father   . Hyperlipidemia Father   . Hypertension Father   . Cancer Father        lung, smoker  . Heart disease Father        CABG x 4  . Alcoholism Father   . Prostate cancer Maternal Grandfather   . Cancer Maternal Grandfather        colon cancer, prostate  . Colon cancer Maternal Grandfather   . Alcohol abuse Paternal Grandmother        died of pneumonia  . Heart disease Paternal Grandfather        MI  . Asthma Daughter        allergies  . Hypertension Other        parents and maternal parents  . Breast cancer Neg Hx   . Esophageal cancer Neg Hx   . Rectal cancer Neg Hx   . Stomach cancer Neg Hx     Social History   Socioeconomic History  . Marital status: Single    Spouse name: Not on file  . Number of children: Not on file    . Years of education: Not on file  . Highest education level: Not on file  Occupational History  . Occupation: Midwife  Tobacco Use  . Smoking status: Never Smoker  . Smokeless tobacco: Never Used  Substance and Sexual Activity  . Alcohol use: Yes    Alcohol/week: 0.0 standard drinks    Comment: wine ocassion  . Drug use: No  . Sexual activity: Yes    Birth control/protection: None    Comment: avoid dairy, lives with husband, daughters. works at school bus driver  Other Topics Concern  . Not on file  Social History Narrative  . Not on file   Social Determinants of Health   Financial Resource Strain:   . Difficulty of Paying Living Expenses: Not on file  Food Insecurity:   . Worried About Programme researcher, broadcasting/film/video in the Last Year: Not on file  . Ran Out of Food in the Last Year: Not on file  Transportation Needs:   . Lack of Transportation (Medical): Not on file  . Lack of Transportation (Non-Medical): Not on file  Physical Activity:   . Days of Exercise per Week: Not on file  . Minutes of Exercise per Session: Not on file  Stress:   . Feeling of Stress : Not on file  Social Connections:   . Frequency of Communication with Friends and Family: Not on file  . Frequency of Social Gatherings with Friends and Family: Not on file  . Attends Religious Services: Not on file  . Active Member of Clubs or Organizations: Not on file  . Attends Banker Meetings: Not on file  . Marital Status: Not on file  Intimate Partner Violence:   . Fear of Current or Ex-Partner: Not on file  . Emotionally Abused: Not on file  . Physically Abused: Not on file  . Sexually Abused: Not on file    Outpatient Medications Prior to Visit  Medication Sig Dispense Refill  . atorvastatin (LIPITOR) 10 MG tablet Take 1 tablet (10 mg total) by mouth daily. 90 tablet 3  . azelastine (OPTIVAR) 0.05 % ophthalmic solution Place 1 drop into both eyes 2 (two) times daily. 6 mL 1  . BIOTIN PO Take  by mouth.    . hydrochlorothiazide (HYDRODIURIL) 25 MG tablet Take 1  tablet (25 mg total) by mouth daily. (Patient not taking: Reported on 08/03/2019) 90 tablet 1  . Lactobacillus Rhamnosus, GG, (CULTURELLE PO) Take by mouth.    . metFORMIN (GLUCOPHAGE XR) 500 MG 24 hr tablet Take 1 tablet (500 mg total) by mouth 3 (three) times daily. 270 tablet 1  . metoprolol succinate (TOPROL-XL) 50 MG 24 hr tablet TAKE 1 TABLET BY MOUTH WITH MEALS OR IMMEDIATELY FOLLOWING A MEAL 90 tablet 0  . Multiple Vitamins-Minerals (MULTIVITAMIN ADULTS) TABS Take by mouth daily.    Marland Kitchen. tiZANidine (ZANAFLEX) 4 MG tablet Take 1 tablet (4 mg total) by mouth at bedtime as needed for muscle spasms. (Patient not taking: Reported on 08/03/2019) 30 tablet 0  . Vitamin D, Ergocalciferol, (DRISDOL) 1.25 MG (50000 UT) CAPS capsule TAKE 1 CAPSULE BY MOUTH ONCE A WEEK 12 capsule 1  . hyoscyamine (LEVSIN SL) 0.125 MG SL tablet Place 1 tablet (0.125 mg total) under the tongue every 4 (four) hours as needed. 30 tablet 1   No facility-administered medications prior to visit.    No Known Allergies  Review of Systems  Constitutional: Positive for malaise/fatigue. Negative for fever.  HENT: Negative for congestion.   Eyes: Negative for blurred vision.  Respiratory: Negative for shortness of breath.   Cardiovascular: Negative for chest pain, palpitations and leg swelling.  Gastrointestinal: Positive for abdominal pain. Negative for diarrhea, melena, nausea and vomiting.  Genitourinary: Negative for dysuria and frequency.  Musculoskeletal: Positive for back pain, joint pain, myalgias and neck pain. Negative for falls.  Skin: Negative for rash.  Neurological: Negative for dizziness, loss of consciousness and headaches.  Endo/Heme/Allergies: Negative for environmental allergies.  Psychiatric/Behavioral: Negative for depression. The patient is not nervous/anxious.        Objective:    Physical Exam Vitals and nursing note reviewed.    Constitutional:      General: She is not in acute distress.    Appearance: She is well-developed.  HENT:     Head: Normocephalic and atraumatic.     Nose: Nose normal.  Eyes:     General:        Right eye: No discharge.        Left eye: No discharge.  Cardiovascular:     Rate and Rhythm: Normal rate and regular rhythm.     Heart sounds: No murmur.  Pulmonary:     Effort: Pulmonary effort is normal.     Breath sounds: Normal breath sounds.  Abdominal:     General: Bowel sounds are normal.     Palpations: Abdomen is soft.     Tenderness: There is no abdominal tenderness.  Musculoskeletal:     Cervical back: Normal range of motion and neck supple.  Skin:    General: Skin is warm and dry.  Neurological:     Mental Status: She is alert and oriented to person, place, and time.     BP 132/90 (BP Location: Left Arm, Patient Position: Sitting, Cuff Size: Normal)   Pulse 70   Temp 98.1 F (36.7 C) (Temporal)   Resp 18   Wt 238 lb 9.6 oz (108.2 kg)   LMP 09/07/2019   SpO2 98%   BMI 39.71 kg/m  Wt Readings from Last 3 Encounters:  10/05/19 238 lb 9.6 oz (108.2 kg)  08/03/19 239 lb 6.4 oz (108.6 kg)  11/11/18 234 lb 12.8 oz (106.5 kg)    Diabetic Foot Exam - Simple   No data filed     Lab Results  Component Value Date   WBC 7.3 04/17/2019   HGB 13.8 04/17/2019   HCT 38.9 04/17/2019   PLT 370 04/17/2019   GLUCOSE 187 (H) 04/17/2019   CHOL 199 04/17/2019   TRIG 141 04/17/2019   HDL 43 (L) 04/17/2019   LDLDIRECT 149.0 02/21/2017   LDLCALC 131 (H) 04/17/2019   ALT 23 04/17/2019   AST 15 04/17/2019   NA 138 04/17/2019   K 4.1 04/17/2019   CL 104 04/17/2019   CREATININE 0.92 04/17/2019   BUN 10 04/17/2019   CO2 19 (L) 04/17/2019   TSH 1.46 04/17/2019   HGBA1C 7.0 (H) 04/17/2019   MICROALBUR 1.4 02/16/2016    Lab Results  Component Value Date   TSH 1.46 04/17/2019   Lab Results  Component Value Date   WBC 7.3 04/17/2019   HGB 13.8 04/17/2019   HCT 38.9  04/17/2019   MCV 89.4 04/17/2019   PLT 370 04/17/2019   Lab Results  Component Value Date   NA 138 04/17/2019   K 4.1 04/17/2019   CO2 19 (L) 04/17/2019   GLUCOSE 187 (H) 04/17/2019   BUN 10 04/17/2019   CREATININE 0.92 04/17/2019   BILITOT 0.3 04/17/2019   ALKPHOS 52 11/11/2018   AST 15 04/17/2019   ALT 23 04/17/2019   PROT 6.7 04/17/2019   ALBUMIN 4.1 11/11/2018   CALCIUM 9.3 04/17/2019   ANIONGAP 10 03/22/2015   GFR 70.66 11/11/2018   Lab Results  Component Value Date   CHOL 199 04/17/2019   Lab Results  Component Value Date   HDL 43 (L) 04/17/2019   Lab Results  Component Value Date   LDLCALC 131 (H) 04/17/2019   Lab Results  Component Value Date   TRIG 141 04/17/2019   Lab Results  Component Value Date   CHOLHDL 4.6 04/17/2019   Lab Results  Component Value Date   HGBA1C 7.0 (H) 04/17/2019       Assessment & Plan:   Problem List Items Addressed This Visit    Hyperlipidemia, mixed    Tolerating statin, encouraged heart healthy diet, avoid trans fats, minimize simple carbs and saturated fats. Increase exercise as tolerated      Relevant Orders   Lipid panel   Diabetes mellitus type 2 in obese (HCC)    hgba1c acceptable, minimize simple carbs. Increase exercise as tolerated. Continue current meds      Relevant Orders   A1C   Essential hypertension    Well controlled, no changes to meds. Encouraged heart healthy diet such as the DASH diet and exercise as tolerated.       Relevant Orders   CBC   CMP   TSH   Vitamin D deficiency    Supplement and monitor      Relevant Orders   VITAMIN D   Arthralgia    Patient is complaining of significant increasing chronic pain.  She is complaining of diffuse pain throughout mostly in joints.  She describes pains is in her knees her hips her arms her hands her feet.  She is asking for referral to podiatry.  For her feet.  Her left arm is worse than her right arm.  Her left hand pain also generalizes to  left elbow all the way up to her left shoulder right hand stays concentrated in the hand.  She is right-handed.  We will check rheumatoid factor, ANA and sed rate.  She is encouraged to stay as active as she can tolerate. Given a prescription for  Naproxen 375 mg to use once to twice daily prn but encouraged to try Tylenol 500 mg bid first and to stay active and eat a low carb, heart healthy diet. Check labs      Relevant Orders   SED RATE   Rheumatoid Factor   Antinuclear Antib (ANA)   TOS (thoracic outlet syndrome)    With some persistent neck and LUE pain, manageable at this time check labs and a trial of naproxen and tylenol prn if no improvement will need referral for further consideration.       Colon polyps - Primary    And FH of colon cancer. Referred to gastroenterology for further consideration      Relevant Orders   Ambulatory referral to Gastroenterology   History of esophageal stricture   Relevant Orders   Ambulatory referral to Gastroenterology   Type 2 diabetes mellitus with hyperglycemia, without long-term current use of insulin ([place])    hgba1c acceptable, minimize simple carbs. Increase exercise as tolerated. Continue current meds      Diarrhea    She is reporting trouble with frequent loose stool off and on for a couple of months but worse over the last month.  She reports that she does not take anything she will have 5-6 loose stool daily.  No bloody or tarry stool although she does note when she takes Pepto-Bismol it darkens and once she did note a pink tinge in her stool.  She also has intermittent abdominal cramping sometimes in the epigastrium sometimes lower.  No nausea or vomiting.  No fevers or chills.  Will prescribe Lomotil as needed for diarrhea and is also in position of hyoscyamine to use as needed for pain.  Is referred to gastroenterology for further intervention and consideration at this time.      Relevant Orders   Ambulatory referral to Gastroenterology    Abscess    Posterior left shoulder area cleaned and debrided with gauze and hydrogen peroxide.  Significant amount of bloody pus was removed.  Patient tolerated well.  Area is covered with sterile bandage and patient is placed on Keflex 500 mg 4 times daily and referred to general surgery for more definitive debridement as a small abscess has been present in a cystic structure for many months and over the last few days had just enlarged.  No systemic symptoms at this time.      Relevant Orders   Ambulatory referral to General Surgery    Other Visit Diagnoses    Abdominal pain, unspecified abdominal location       Relevant Orders   Ambulatory referral to Gastroenterology   Bilateral bunions       Relevant Orders   Ambulatory referral to Podiatry      I am having Diamond Nickel start on diphenoxylate-atropine, naproxen, and cephALEXin. I am also having her maintain her hydrochlorothiazide, azelastine, BIOTIN PO, (Lactobacillus Rhamnosus, GG, (CULTURELLE PO)), Vitamin D (Ergocalciferol), tiZANidine, atorvastatin, metFORMIN, metoprolol succinate, Multivitamin Adults, and hyoscyamine.  Meds ordered this encounter  Medications  . diphenoxylate-atropine (LOMOTIL) 2.5-0.025 MG tablet    Sig: Take 1 tablet by mouth 4 (four) times daily as needed for diarrhea or loose stools.    Dispense:  30 tablet    Refill:  1  . hyoscyamine (LEVSIN SL) 0.125 MG SL tablet    Sig: Place 1 tablet (0.125 mg total) under the tongue every 4 (four) hours as needed.    Dispense:  30 tablet    Refill:  1  .  naproxen (NAPROSYN) 375 MG tablet    Sig: Take 1 tablet (375 mg total) by mouth 2 (two) times daily with a meal.    Dispense:  60 tablet    Refill:  1  . cephALEXin (KEFLEX) 500 MG capsule    Sig: Take 1 capsule (500 mg total) by mouth 4 (four) times daily.    Dispense:  40 capsule    Refill:  0     Danise Edge, MD

## 2019-10-05 NOTE — Assessment & Plan Note (Signed)
She is reporting trouble with frequent loose stool off and on for a couple of months but worse over the last month.  She reports that she does not take anything she will have 5-6 loose stool daily.  No bloody or tarry stool although she does note when she takes Pepto-Bismol it darkens and once she did note a pink tinge in her stool.  She also has intermittent abdominal cramping sometimes in the epigastrium sometimes lower.  No nausea or vomiting.  No fevers or chills.  Will prescribe Lomotil as needed for diarrhea and is also in position of hyoscyamine to use as needed for pain.  Is referred to gastroenterology for further intervention and consideration at this time.

## 2019-10-05 NOTE — Assessment & Plan Note (Signed)
hgba1c acceptable, minimize simple carbs. Increase exercise as tolerated. Continue current meds 

## 2019-10-05 NOTE — Assessment & Plan Note (Signed)
With some persistent neck and LUE pain, manageable at this time check labs and a trial of naproxen and tylenol prn if no improvement will need referral for further consideration.

## 2019-10-05 NOTE — Assessment & Plan Note (Signed)
Posterior left shoulder area cleaned and debrided with gauze and hydrogen peroxide.  Significant amount of bloody pus was removed.  Patient tolerated well.  Area is covered with sterile bandage and patient is placed on Keflex 500 mg 4 times daily and referred to general surgery for more definitive debridement as a small abscess has been present in a cystic structure for many months and over the last few days had just enlarged.  No systemic symptoms at this time.

## 2019-10-05 NOTE — Assessment & Plan Note (Signed)
And FH of colon cancer. Referred to gastroenterology for further consideration

## 2019-10-05 NOTE — Assessment & Plan Note (Signed)
Well controlled, no changes to meds. Encouraged heart healthy diet such as the DASH diet and exercise as tolerated.  °

## 2019-10-05 NOTE — Patient Instructions (Addendum)
Multivitamin with minerals, selenium Vitamin D 09-1998 IU daily Aspirin  EC 81 mg daily Probiotic daily   Melatonin 2-5 mg at bedtime   Continue Tylenol as needed up to 3000 mg in 24 hours  Diarrhea, Adult Diarrhea is frequent loose and watery bowel movements. Diarrhea can make you feel weak and cause you to become dehydrated. Dehydration can make you tired and thirsty, cause you to have a dry mouth, and decrease how often you urinate. Diarrhea typically lasts 2-3 days. However, it can last longer if it is a sign of something more serious. It is important to treat your diarrhea as told by your health care provider. Follow these instructions at home: Eating and drinking     Follow these recommendations as told by your health care provider:  Take an oral rehydration solution (ORS). This is an over-the-counter medicine that helps return your body to its normal balance of nutrients and water. It is found at pharmacies and retail stores.  Drink plenty of fluids, such as water, ice chips, diluted fruit juice, and low-calorie sports drinks. You can drink milk also, if desired.  Avoid drinking fluids that contain a lot of sugar or caffeine, such as energy drinks, sports drinks, and soda.  Eat bland, easy-to-digest foods in small amounts as you are able. These foods include bananas, applesauce, rice, lean meats, toast, and crackers.  Avoid alcohol.  Avoid spicy or fatty foods.  Medicines  Take over-the-counter and prescription medicines only as told by your health care provider.  If you were prescribed an antibiotic medicine, take it as told by your health care provider. Do not stop using the antibiotic even if you start to feel better. General instructions   Wash your hands often using soap and water. If soap and water are not available, use a hand sanitizer. Others in the household should wash their hands as well. Hands should be washed: ? After using the toilet or changing a  diaper. ? Before preparing, cooking, or serving food. ? While caring for a sick person or while visiting someone in a hospital.  Drink enough fluid to keep your urine pale yellow.  Rest at home while you recover.  Watch your condition for any changes.  Take a warm bath to relieve any burning or pain from frequent diarrhea episodes.  Keep all follow-up visits as told by your health care provider. This is important. Contact a health care provider if:  You have a fever.  Your diarrhea gets worse.  You have new symptoms.  You cannot keep fluids down.  You feel light-headed or dizzy.  You have a headache.  You have muscle cramps. Get help right away if:  You have chest pain.  You feel extremely weak or you faint.  You have bloody or black stools or stools that look like tar.  You have severe pain, cramping, or bloating in your abdomen.  You have trouble breathing or you are breathing very quickly.  Your heart is beating very quickly.  Your skin feels cold and clammy.  You feel confused.  You have signs of dehydration, such as: ? Dark urine, very little urine, or no urine. ? Cracked lips. ? Dry mouth. ? Sunken eyes. ? Sleepiness. ? Weakness. Summary  Diarrhea is frequent loose and watery bowel movements. Diarrhea can make you feel weak and cause you to become dehydrated.  Drink enough fluids to keep your urine pale yellow.  Make sure that you wash your hands after using the toilet.  If soap and water are not available, use hand sanitizer.  Contact a health care provider if your diarrhea gets worse or you have new symptoms.  Get help right away if you have signs of dehydration. This information is not intended to replace advice given to you by your health care provider. Make sure you discuss any questions you have with your health care provider. Document Revised: 01/20/2019 Document Reviewed: 02/07/2018 Elsevier Patient Education  [place].

## 2019-10-05 NOTE — Assessment & Plan Note (Signed)
Supplement and monitor 

## 2019-10-05 NOTE — Assessment & Plan Note (Addendum)
Patient is complaining of significant increasing chronic pain.  She is complaining of diffuse pain throughout mostly in joints.  She describes pains is in her knees her hips her arms her hands her feet.  She is asking for referral to podiatry.  For her feet.  Her left arm is worse than her right arm.  Her left hand pain also generalizes to left elbow all the way up to her left shoulder right hand stays concentrated in the hand.  She is right-handed.  We will check rheumatoid factor, ANA and sed rate.  She is encouraged to stay as active as she can tolerate. Given a prescription for Naproxen 375 mg to use once to twice daily prn but encouraged to try Tylenol 500 mg bid first and to stay active and eat a low carb, heart healthy diet. Check labs

## 2019-10-06 NOTE — Progress Notes (Signed)
Spoke to patient and gave results. Patient stated she would like to know if you were going to refer her to someone such as a Careers adviser for her back that was discussed at visit

## 2019-10-07 ENCOUNTER — Other Ambulatory Visit: Payer: Self-pay | Admitting: Family Medicine

## 2019-10-07 DIAGNOSIS — R768 Other specified abnormal immunological findings in serum: Secondary | ICD-10-CM

## 2019-10-07 DIAGNOSIS — R7689 Other specified abnormal immunological findings in serum: Secondary | ICD-10-CM

## 2019-10-07 DIAGNOSIS — M2550 Pain in unspecified joint: Secondary | ICD-10-CM

## 2019-10-07 LAB — ANA: Anti Nuclear Antibody (ANA): POSITIVE — AB

## 2019-10-07 LAB — ANTI-NUCLEAR AB-TITER (ANA TITER): ANA Titer 1: 1:40 {titer} — ABNORMAL HIGH

## 2019-10-07 LAB — RHEUMATOID FACTOR: Rheumatoid fact SerPl-aCnc: <14 IU/mL (ref <14)

## 2019-10-08 ENCOUNTER — Other Ambulatory Visit: Payer: Self-pay | Admitting: Family Medicine

## 2019-10-08 ENCOUNTER — Telehealth: Payer: Self-pay | Admitting: Family Medicine

## 2019-10-08 NOTE — Telephone Encounter (Signed)
Pt went to local pharmacy today and they do not have a prescription for metformin or glipizide. Pt states the glipizide was discussed with Dr. Abner Greenspan 10/05/2019. Please call pt to notify. She is traveling and will need 3 month RX for the correct med sent in for her.   Please call pt at [phone]  [facility] [address], [place] - [address]

## 2019-10-09 MED ORDER — GLIMEPIRIDE 1 MG PO TABS: 1.0000 mg | ORAL_TABLET | Freq: Every day | ORAL | 3 refills | Status: DC

## 2019-10-09 MED ORDER — METFORMIN HCL ER 500 MG PO TB24: 500.0000 mg | ORAL_TABLET | Freq: Three times a day (TID) | ORAL | 1 refills | Status: DC

## 2019-10-09 MED ORDER — ATORVASTATIN CALCIUM 20 MG PO TABS: 20.0000 mg | ORAL_TABLET | Freq: Every day | ORAL | 3 refills | Status: DC

## 2019-10-09 NOTE — Telephone Encounter (Signed)
So the note is on her last lab results I asked for Glimeperide into her pharmacy and to increase her Atorvastatin. Please do that and let her know

## 2019-10-09 NOTE — Telephone Encounter (Signed)
thanks

## 2019-10-09 NOTE — Telephone Encounter (Signed)
I see, looks like I wasn't the one who gave her, her results. Meds sent in patient made aware

## 2019-10-09 NOTE — Telephone Encounter (Signed)
I refilled the metformin for the patient I do not see the glipizide in her chart  Please advise on dosage and sig

## 2019-10-15 ENCOUNTER — Other Ambulatory Visit: Payer: Self-pay | Admitting: *Deleted

## 2019-10-15 DIAGNOSIS — M2550 Pain in unspecified joint: Secondary | ICD-10-CM

## 2019-10-15 MED ORDER — GLIMEPIRIDE 1 MG PO TABS: 1.0000 mg | ORAL_TABLET | Freq: Every day | ORAL | 3 refills | Status: DC

## 2019-10-15 NOTE — Addendum Note (Signed)
Addended by: Thelma Barge D on: 10/15/2019 11:09 AM   Modules accepted: Orders

## 2019-10-16 ENCOUNTER — Ambulatory Visit (HOSPITAL_BASED_OUTPATIENT_CLINIC_OR_DEPARTMENT_OTHER)
Admission: RE | Admit: 2019-10-16 | Discharge: 2019-10-16 | Disposition: A | Discharge status: Home / Self Care | Payer: 59 | Source: Ambulatory Visit | Attending: Obstetrics & Gynecology | Admitting: Obstetrics & Gynecology

## 2019-10-16 ENCOUNTER — Encounter: Payer: Self-pay | Admitting: Obstetrics & Gynecology

## 2019-10-16 ENCOUNTER — Other Ambulatory Visit: Payer: Self-pay

## 2019-10-16 ENCOUNTER — Other Ambulatory Visit (INDEPENDENT_AMBULATORY_CARE_PROVIDER_SITE_OTHER): Payer: 59

## 2019-10-16 ENCOUNTER — Ambulatory Visit (INDEPENDENT_AMBULATORY_CARE_PROVIDER_SITE_OTHER): Payer: 59 | Admitting: Obstetrics & Gynecology

## 2019-10-16 VITALS — BP 144/90 | HR 67 | Ht 65.0 in | Wt 241.0 lb

## 2019-10-16 DIAGNOSIS — R102 Pelvic and perineal pain: Secondary | ICD-10-CM | POA: Diagnosis not present

## 2019-10-16 DIAGNOSIS — N920 Excessive and frequent menstruation with regular cycle: Secondary | ICD-10-CM | POA: Diagnosis not present

## 2019-10-16 DIAGNOSIS — Z01419 Encounter for gynecological examination (general) (routine) without abnormal findings: Secondary | ICD-10-CM

## 2019-10-16 DIAGNOSIS — M2550 Pain in unspecified joint: Secondary | ICD-10-CM

## 2019-10-16 DIAGNOSIS — N852 Hypertrophy of uterus: Secondary | ICD-10-CM | POA: Diagnosis not present

## 2019-10-16 DIAGNOSIS — Z124 Encounter for screening for malignant neoplasm of cervix: Secondary | ICD-10-CM

## 2019-10-16 DIAGNOSIS — Z1151 Encounter for screening for human papillomavirus (HPV): Secondary | ICD-10-CM | POA: Diagnosis not present

## 2019-10-16 DIAGNOSIS — Z113 Encounter for screening for infections with a predominantly sexual mode of transmission: Secondary | ICD-10-CM | POA: Diagnosis not present

## 2019-10-16 LAB — CBC WITH DIFFERENTIAL/PLATELET
Basophils Absolute: 0.1 10*3/uL (ref 0.0–0.1)
Basophils Relative: 0.8 % (ref 0.0–3.0)
Eosinophils Absolute: 0.1 10*3/uL (ref 0.0–0.7)
Eosinophils Relative: 1 % (ref 0.0–5.0)
HCT: 39.8 % (ref 36.0–46.0)
Hemoglobin: 13.2 g/dL (ref 12.0–15.0)
Lymphocytes Relative: 26.6 % (ref 12.0–46.0)
Lymphs Abs: 1.8 10*3/uL (ref 0.7–4.0)
MCHC: 33.2 g/dL (ref 30.0–36.0)
MCV: 93.9 fl (ref 78.0–100.0)
Monocytes Absolute: 0.4 10*3/uL (ref 0.1–1.0)
Monocytes Relative: 5.9 % (ref 3.0–12.0)
Neutro Abs: 4.3 10*3/uL (ref 1.4–7.7)
Neutrophils Relative %: 65.7 % (ref 43.0–77.0)
Platelets: 376 10*3/uL (ref 150.0–400.0)
RBC: 4.24 Mil/uL (ref 3.87–5.11)
RDW: 13.8 % (ref 11.5–15.5)
WBC: 6.6 10*3/uL (ref 4.0–10.5)

## 2019-10-16 LAB — SEDIMENTATION RATE: Sed Rate: 10 mm/hr (ref 0–20)

## 2019-10-16 NOTE — Progress Notes (Signed)
Subjective:     Chelsea Bray is a 49 y.o. female here for a routine exam. G3P2012 monthly cycles that last 5 days. The cycles have become heavier. No intermenstrual bleeding. 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 Pt reports pain and pelvic pressure. She reports that this has started to limit her activities and she wants to consider tx options.     Gynecologic History Patient's last menstrual period was 10/11/2019. Contraception: tubal ligation Last Pap: 2014. Results were: normal Last mammogram: 09/07/2019. Results were: normal  Obstetric History OB History  Gravida Para Term Preterm AB Living  4 2 2   2 2   SAB TAB Ectopic Multiple Live Births  2       2    # Outcome Date GA Lbr Len/2nd Weight Sex Delivery Anes PTL Lv  4 SAB 1998          3 Term 27 [redacted]w[redacted]d   F Vag-Spont EPI N LIV  2 Term 1996 [redacted]w[redacted]d   F Vag-Spont EPI N LIV  1 SAB 1995             The following portions of the patient's history were reviewed and updated as appropriate: allergies, current medications, past family history, past medical history, past social history, past surgical history and problem list.  Review of Systems Pertinent items are noted in HPI.    Objective:  BP (!) 144/90   Pulse 67   Ht 5\' 5"  (1.651 m)   Wt 241 lb (109.3 kg)   LMP 10/11/2019   Breastfeeding No   BMI 40.10 kg/m   General Appearance:    Alert, cooperative, no distress, appears stated age  Head:    Normocephalic, without obvious abnormality, atraumatic  Eyes:    conjunctiva/corneas clear, EOM's intact, both eyes  Ears:    Normal external ear canals, both ears  Nose:   Nares normal, septum midline, mucosa normal, no drainage    or sinus tenderness  Throat:   Lips, mucosa, and tongue normal; teeth and gums normal  Neck:   Supple, symmetrical, trachea midline, no adenopathy;    thyroid:  no enlargement/tenderness/nodules  Back:     Symmetric, no curvature, ROM  normal, no CVA tenderness  Lungs:     respirations unlabored  Chest Wall:    No tenderness or deformity   Heart:    Regular rate and rhythm  Breast Exam:    No tenderness, masses, or nipple abnormality; well healed incisions due to reduction mammoplasty  Abdomen:     Soft, non-tender, bowel sounds active all four quadrants,    no masses, no organomegaly  Genitalia:    Normal female without lesion, discharge or tenderness; uterus enlarged- suspect due to fibroids. Uterus mobile. Exam limited by body habitus. No adnexal masses noted.       Extremities:   Extremities normal, atraumatic, no cyanosis or edema  Pulses:   2+ and symmetric all extremities  Skin:   Skin color, texture, turgor normal, no rashes or lesions    Assessment:    Healthy female exam.   Uterine enlargement with sx of heavier menses and pain, suspect uterine fibroids.     Plan:   F/u PAP with [facility] Pelvic F/u in 4 weeks to review 10/13/2019 and discuss management of fibroids.  Annual mammogram   Roye Gustafson L. Harraway-Smith, M.D., [place]

## 2019-10-16 NOTE — Patient Instructions (Signed)

## 2019-10-19 LAB — CYTOLOGY - PAP
Chlamydia: NEGATIVE
Comment: NEGATIVE
Comment: NEGATIVE
Comment: NORMAL
Diagnosis: NEGATIVE
High risk HPV: NEGATIVE
Neisseria Gonorrhea: NEGATIVE

## 2019-11-04 ENCOUNTER — Ambulatory Visit: Payer: No Typology Code available for payment source | Admitting: Internal Medicine

## 2019-11-06 ENCOUNTER — Ambulatory Visit: Payer: No Typology Code available for payment source | Admitting: Family Medicine

## 2019-11-07 IMAGING — DX DG HIP (WITH OR WITHOUT PELVIS) 2V BILAT
5 series · 5 of 5 positions shown · non-contrast
Comparison: None.

CLINICAL DATA: Bilateral hip and low back pain

EXAM:
DG HIP (WITH OR WITHOUT PELVIS) 2V BILAT

[pelvis ap]
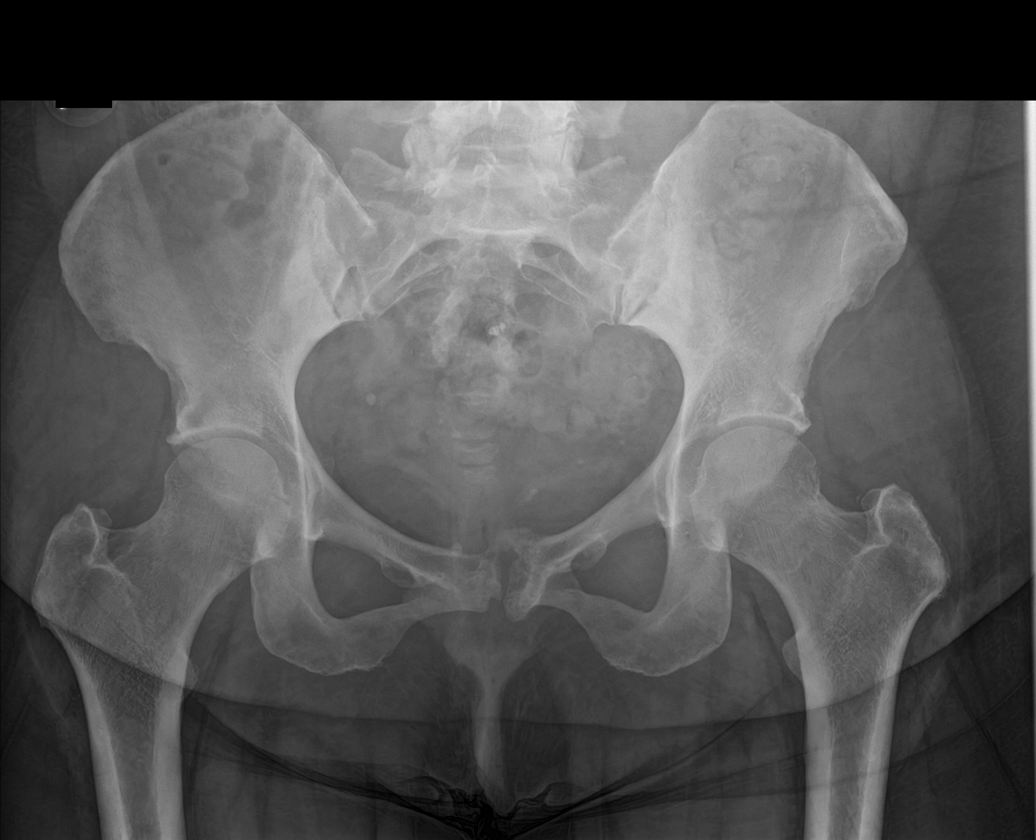

[hip ap (1 of 2)]
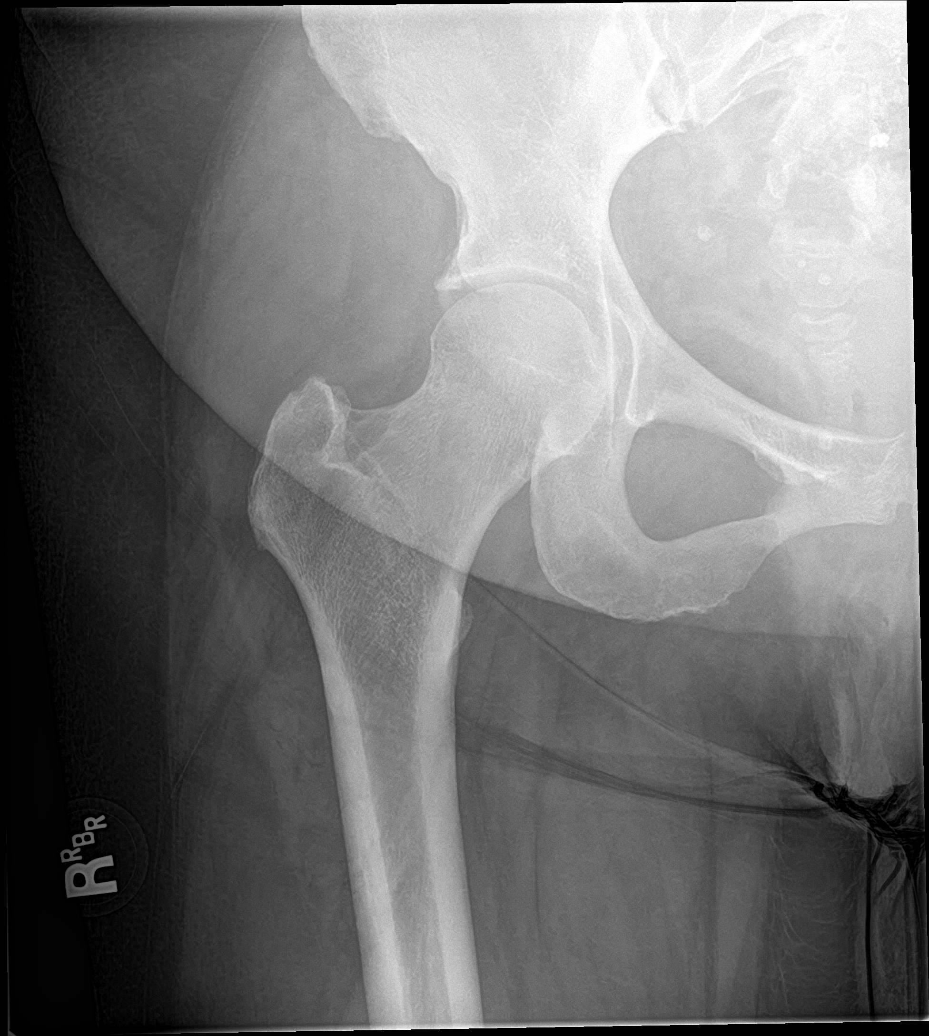

[hip lat (1 of 2)]
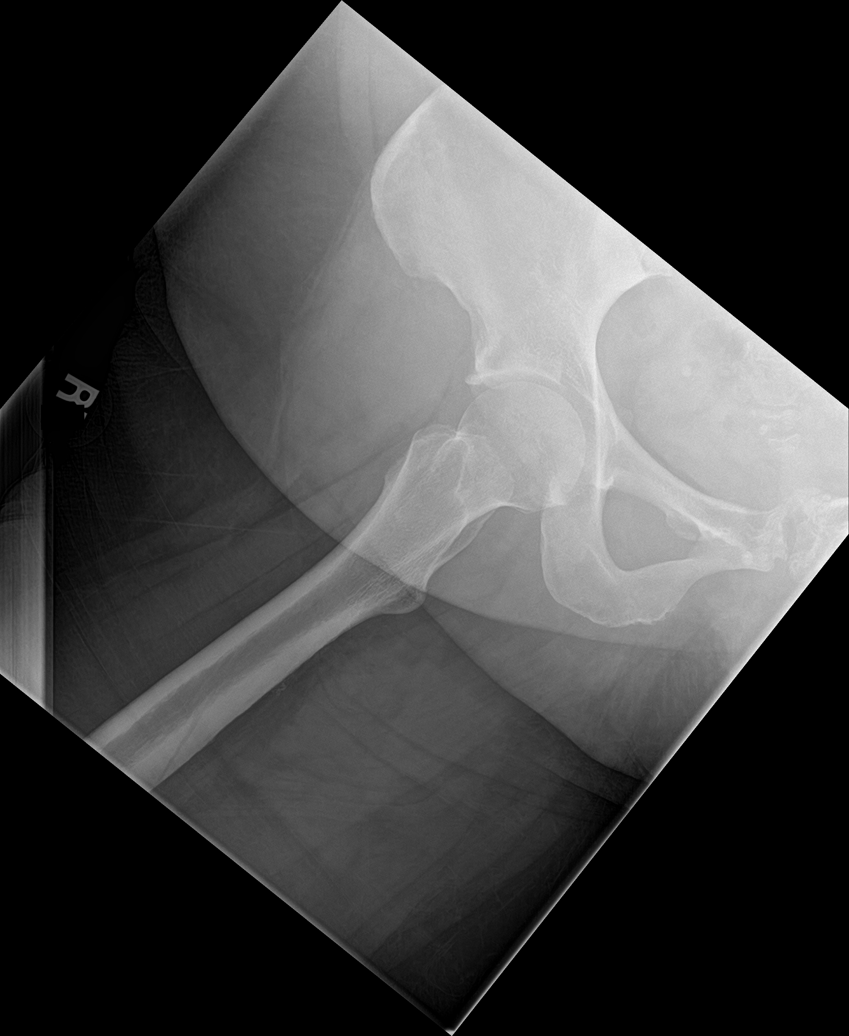

[hip ap (2 of 2)]
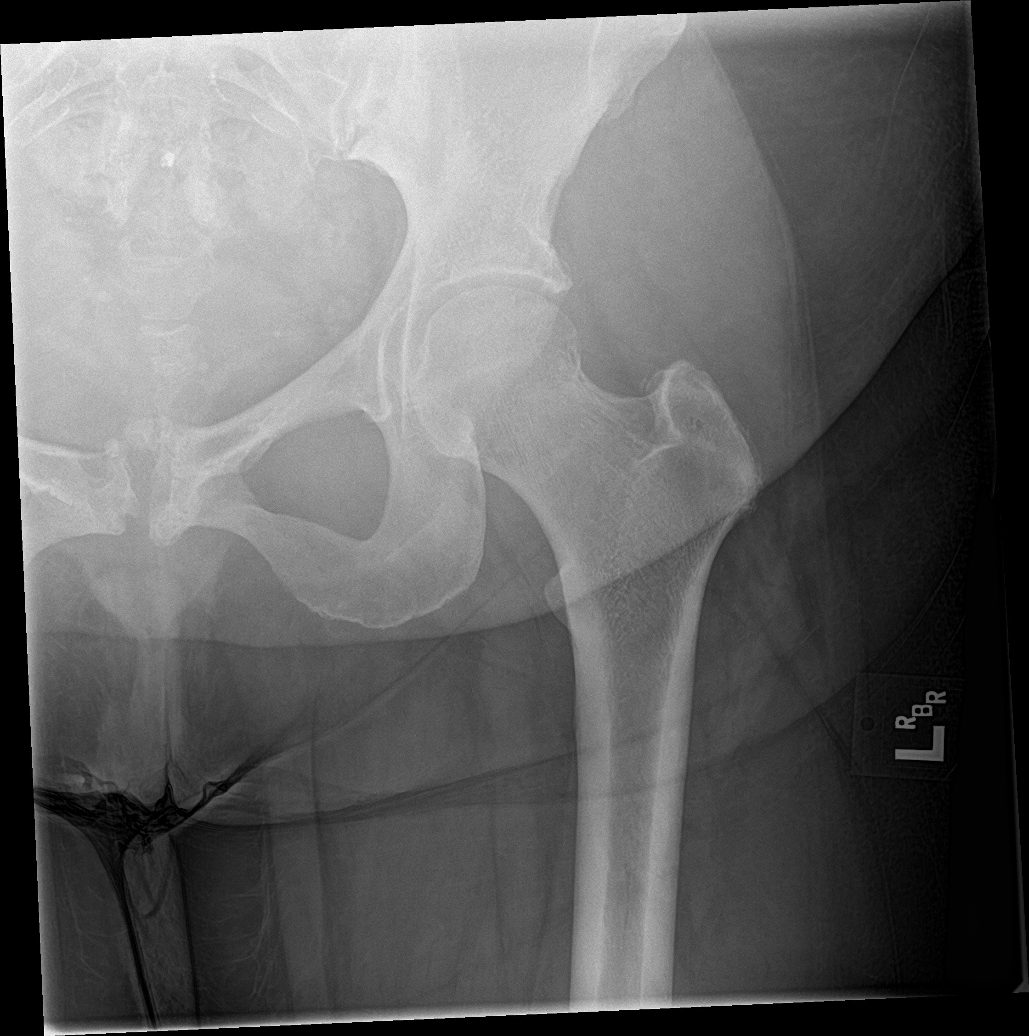

[hip lat (2 of 2)]
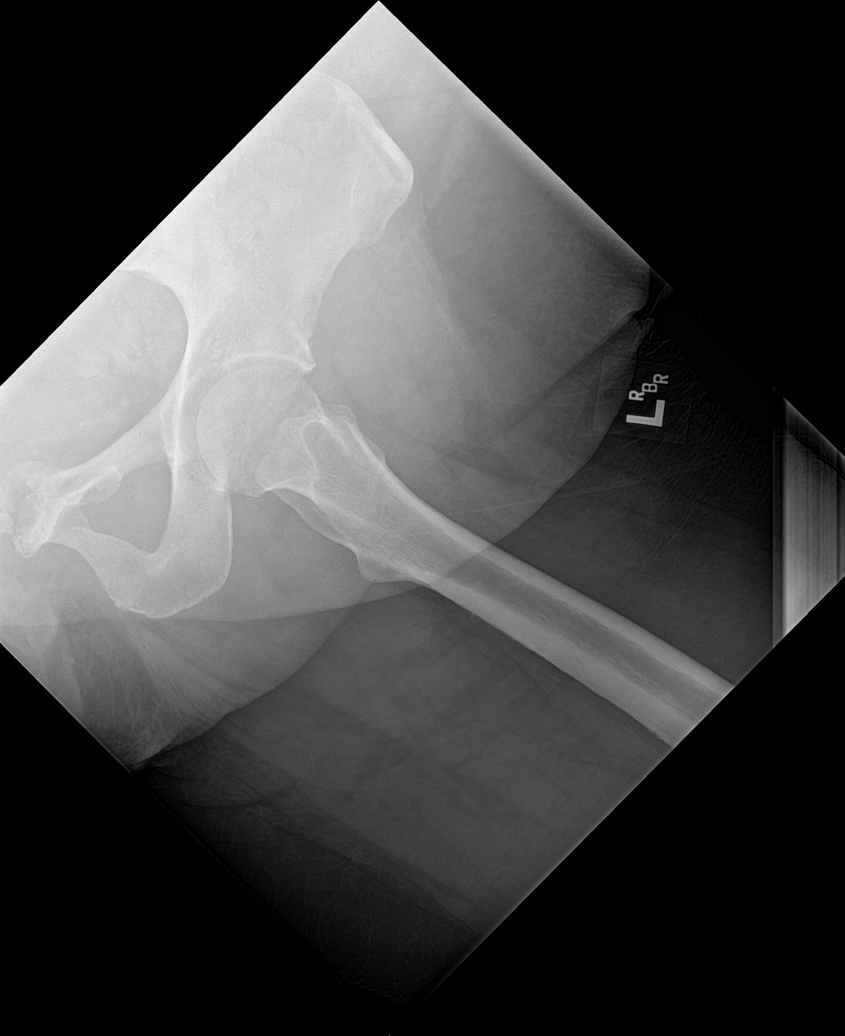

[5 of 5 positions shown; findings below may reference images not displayed]

FINDINGS: Spurring and distortion at the symphysis pubis. No definite
underlying fracture deformity. Both hips are located without
significant spurring or joint narrowing.
IMPRESSION: 1. Negative for hip narrowing or significant spurring.
2. Widening and spurring at the symphysis pubis, question remote
trauma.

## 2019-11-12 ENCOUNTER — Other Ambulatory Visit: Payer: Self-pay | Admitting: Family Medicine

## 2019-11-12 DIAGNOSIS — I10 Essential (primary) hypertension: Secondary | ICD-10-CM

## 2019-11-12 MED ORDER — METOPROLOL SUCCINATE ER 50 MG PO TB24: ORAL_TABLET | ORAL | 0 refills | Status: DC

## 2019-11-12 NOTE — Telephone Encounter (Signed)
Medication refilled, #30 w/ no RF. Patient advised.

## 2019-11-12 NOTE — Telephone Encounter (Signed)
Patient is in need of medication due to her leaving hers at home. Patient is a over the road truck driver .    Medication needed : metoprolol succinate (TOPROL-XL) 50 MG 24 hr tablet [facility] patient is using :   [email or web address] [address] [place], [place], [place] [address] [phone]

## 2019-11-20 ENCOUNTER — Ambulatory Visit (AMBULATORY_SURGERY_CENTER): Payer: Self-pay | Admitting: *Deleted

## 2019-11-20 ENCOUNTER — Other Ambulatory Visit: Payer: Self-pay

## 2019-11-20 VITALS — Temp 97.5°F | Ht 65.0 in | Wt 240.2 lb

## 2019-11-20 DIAGNOSIS — Z01818 Encounter for other preprocedural examination: Secondary | ICD-10-CM

## 2019-11-20 DIAGNOSIS — Z86010 Personal history of colonic polyps: Secondary | ICD-10-CM

## 2019-11-20 MED ORDER — SUPREP BOWEL PREP KIT 17.5-3.13-1.6 GM/177ML PO SOLN: ORAL | 0 refills | Status: DC

## 2019-11-20 NOTE — Progress Notes (Signed)
covid test 11-30-19 at 1:00 pm  Pt is aware that care partner will wait in the car during procedure; if they feel like they will be too hot or cold to wait in the car; they may wait in the 4 th floor lobby. Patient is aware to bring only one care partner. We want them to wear a mask (we do not have any that we can provide them), practice social distancing, and we will check their temperatures when they get here.  I did remind the patient that their care partner needs to stay in the parking lot the entire time and have a cell phone available, we will call them when the pt is ready for discharge. Patient will wear mask into building.   No trouble with anesthesia, difficulty with intubation or hx/fam hx of malignant hyperthermia per pt   No egg or soy allergy  No home oxygen use   No medications for weight loss taken  emmi information given

## 2019-11-25 ENCOUNTER — Ambulatory Visit: Payer: 59 | Admitting: Obstetrics & Gynecology

## 2019-11-26 ENCOUNTER — Ambulatory Visit: Payer: PRIVATE HEALTH INSURANCE | Admitting: Podiatry

## 2019-11-30 ENCOUNTER — Ambulatory Visit (INDEPENDENT_AMBULATORY_CARE_PROVIDER_SITE_OTHER): Payer: 59

## 2019-11-30 ENCOUNTER — Other Ambulatory Visit: Payer: Self-pay

## 2019-11-30 ENCOUNTER — Other Ambulatory Visit: Payer: Self-pay | Admitting: Internal Medicine

## 2019-11-30 DIAGNOSIS — Z1159 Encounter for screening for other viral diseases: Secondary | ICD-10-CM

## 2019-11-30 LAB — SARS CORONAVIRUS 2 (TAT 6-24 HRS): SARS Coronavirus 2: NEGATIVE

## 2019-12-02 ENCOUNTER — Encounter: Payer: Self-pay | Admitting: Internal Medicine

## 2019-12-02 ENCOUNTER — Other Ambulatory Visit: Payer: Self-pay

## 2019-12-02 ENCOUNTER — Ambulatory Visit (AMBULATORY_SURGERY_CENTER): Payer: 59 | Admitting: Internal Medicine

## 2019-12-02 VITALS — BP 146/89 | HR 83 | Temp 97.1°F | Resp 13 | Ht 65.0 in | Wt 240.0 lb

## 2019-12-02 DIAGNOSIS — Z800 Family history of malignant neoplasm of digestive organs: Secondary | ICD-10-CM

## 2019-12-02 DIAGNOSIS — Z1211 Encounter for screening for malignant neoplasm of colon: Secondary | ICD-10-CM | POA: Diagnosis present

## 2019-12-02 DIAGNOSIS — Z8371 Family history of colonic polyps: Secondary | ICD-10-CM

## 2019-12-02 MED ORDER — SODIUM CHLORIDE 0.9 % IV SOLN: 500.0000 mL | Freq: Once | INTRAVENOUS | Status: DC

## 2019-12-02 NOTE — Op Note (Signed)
[facility] Patient Name: Chelsea Bray Procedure Date: 12/02/2019 8:41 AM MRN: [id number] Endoscopist: Docia Chuck. Henrene Pastor , MD Age: 49 Referring MD:  Date of Birth: 1971/01/15 Gender: Female Account #: [id number] Procedure:                Colonoscopy, routine screening Indications:              Colon cancer screening in patient at increased                            risk: Family history of colorectal cancer in                            maternal grandfather; mother with a history of                            colon polyps. Examination elsewhere 2013 said to                            have been adequate preparation recommended                            follow-up in 2 years Medicines:                Monitored Anesthesia Care Procedure:                Pre-Anesthesia Assessment:                           - Prior to the procedure, a History and Physical                            was performed, and patient medications and                            allergies were reviewed. The patient's tolerance of                            previous anesthesia was also reviewed. The risks                            and benefits of the procedure and the sedation                            options and risks were discussed with the patient.                            All questions were answered, and informed consent                            was obtained. Prior Anticoagulants: The patient has                            taken no previous anticoagulant or antiplatelet  agents. ASA Grade Assessment: II - A patient with                            mild systemic disease. After reviewing the risks                            and benefits, the patient was deemed in                            satisfactory condition to undergo the procedure.                           After obtaining informed consent, the colonoscope                            was passed under direct vision.  Throughout the                            procedure, the patient's blood pressure, pulse, and                            oxygen saturations were monitored continuously. The                            Colonoscope was introduced through the anus and                            advanced to the the cecum, identified by                            appendiceal orifice and ileocecal valve. The                            ileocecal valve, appendiceal orifice, and rectum                            were photographed. The quality of the bowel                            preparation was excellent. The colonoscopy was                            performed without difficulty. The patient tolerated                            the procedure well. The bowel preparation used was                            SUPREP via split dose instruction. Scope In: 8:59:44 AM Scope Out: 9:12:37 AM Scope Withdrawal Time: 0 hours 9 minutes 48 seconds  Total Procedure Duration: 0 hours 12 minutes 53 seconds  Findings:                 Many small and large-mouthed diverticula were found  in the entire colon.                           The entire examined colon appeared normal on direct                            and retroflexion views. Complications:            No immediate complications. Estimated blood loss:                            None. Estimated Blood Loss:     Estimated blood loss: none. Impression:               - Diverticulosis in the entire examined colon.                           - The entire examined colon is normal on direct and                            retroflexion views.                           - No specimens collected. Recommendation:           - Repeat colonoscopy in 10 years for screening                            purposes.                           - Patient has a contact number available for                            emergencies. The signs and symptoms of potential                             delayed complications were discussed with the                            patient. Return to normal activities tomorrow.                            Written discharge instructions were provided to the                            patient.                           - Resume previous diet.                           - Continue present medications. Wilhemina Bonito. Marina Goodell, MD 12/02/2019 9:18:24 AM This report has been signed electronically.

## 2019-12-02 NOTE — Progress Notes (Signed)
Pt's states no medical or surgical changes since previsit or office visit. 

## 2019-12-02 NOTE — Patient Instructions (Signed)
YOU HAD AN ENDOSCOPIC PROCEDURE TODAY AT THE [facility]:   Refer to the procedure report that was given to you for any specific questions about what was found during the examination.  If the procedure report does not answer your questions, please call your gastroenterologist to clarify.  If you requested that your care partner not be given the details of your procedure findings, then the procedure report has been included in a sealed envelope for you to review at your convenience later.  YOU SHOULD EXPECT: Some feelings of bloating in the abdomen. Passage of more gas than usual.  Walking can help get rid of the air that was put into your GI tract during the procedure and reduce the bloating. If you had a lower endoscopy (such as a colonoscopy or flexible sigmoidoscopy) you may notice spotting of blood in your stool or on the toilet paper. If you underwent a bowel prep for your procedure, you may not have a normal bowel movement for a few days.  Please Note:  You might notice some irritation and congestion in your nose or some drainage.  This is from the oxygen used during your procedure.  There is no need for concern and it should clear up in a day or so.  SYMPTOMS TO REPORT IMMEDIATELY:  Following lower endoscopy (colonoscopy or flexible sigmoidoscopy):  Excessive amounts of blood in the stool  Significant tenderness or worsening of abdominal pains  Swelling of the abdomen that is new, acute  Fever of 100F or higher  For urgent or emergent issues, a gastroenterologist can be reached at any hour by calling [phone]. Do not use MyChart messaging for urgent concerns.    DIET:  We do recommend a small meal at first, but then you may proceed to your regular diet.  Drink plenty of fluids but you should avoid alcoholic beverages for 24 hours.  ACTIVITY:  You should plan to take it easy for the rest of today and you should NOT DRIVE or use heavy machinery until tomorrow (because of  the sedation medicines used during the test).    FOLLOW UP: Our staff will call the number listed on your records 48-72 hours following your procedure to check on you and address any questions or concerns that you may have regarding the information given to you following your procedure. If we do not reach you, we will leave a message.  We will attempt to reach you two times.  During this call, we will ask if you have developed any symptoms of COVID 19. If you develop any symptoms (ie: fever, flu-like symptoms, shortness of breath, cough etc.) before then, please call [phone].  If you test positive for Covid 19 in the 2 weeks post procedure, please call and report this information to us.    If any biopsies were taken you will be contacted by phone or by letter within the next 1-3 weeks.  Please call us at [phone] if you have not heard about the biopsies in 3 weeks.    SIGNATURES/CONFIDENTIALITY: You and/or your care partner have signed paperwork which will be entered into your electronic medical record.  These signatures attest to the fact that that the information above on your After Visit Summary has been reviewed and is understood.  Full responsibility of the confidentiality of this discharge information lies with you and/or your care-partner.    Resume medications. Information given on diverticulosis. 

## 2019-12-02 NOTE — Progress Notes (Signed)
To PACU, VSS. Report to [email or web address] 

## 2019-12-03 ENCOUNTER — Encounter: Payer: Self-pay | Admitting: Podiatry

## 2019-12-03 ENCOUNTER — Ambulatory Visit (INDEPENDENT_AMBULATORY_CARE_PROVIDER_SITE_OTHER): Payer: 59

## 2019-12-03 ENCOUNTER — Other Ambulatory Visit: Payer: Self-pay

## 2019-12-03 ENCOUNTER — Ambulatory Visit (INDEPENDENT_AMBULATORY_CARE_PROVIDER_SITE_OTHER): Payer: 59 | Admitting: Podiatry

## 2019-12-03 VITALS — BP 109/84 | HR 98 | Resp 16

## 2019-12-03 DIAGNOSIS — M21622 Bunionette of left foot: Secondary | ICD-10-CM

## 2019-12-03 DIAGNOSIS — M2011 Hallux valgus (acquired), right foot: Secondary | ICD-10-CM

## 2019-12-03 DIAGNOSIS — M2012 Hallux valgus (acquired), left foot: Secondary | ICD-10-CM | POA: Diagnosis not present

## 2019-12-03 NOTE — Patient Instructions (Signed)
Pre-Operative Instructions  Congratulations, you have decided to take an important step towards improving your quality of life.  You can be assured that the doctors and staff at [facility] will be with you every step of the way.  Here are some important things you should know:  1. Plan to be at the surgery center/hospital at least 1 (one) hour prior to your scheduled time, unless otherwise directed by the surgical center/hospital staff.  You must have a responsible adult accompany you, remain during the surgery and drive you home.  Make sure you have directions to the surgical center/hospital to ensure you arrive on time. 2. If you are having surgery at [facility] or [facility], you will need a copy of your medical history and physical form from your family physician within one month prior to the date of surgery. We will give you a form for your primary physician to complete.  3. We make every effort to accommodate the date you request for surgery.  However, there are times where surgery dates or times have to be moved.  We will contact you as soon as possible if a change in schedule is required.   4. No aspirin/ibuprofen for one week before surgery.  If you are on aspirin, any non-steroidal anti-inflammatory medications (Mobic, Aleve, Ibuprofen) should not be taken seven (7) days prior to your surgery.  You make take Tylenol for pain prior to surgery.  5. Medications - If you are taking daily heart and blood pressure medications, seizure, reflux, allergy, asthma, anxiety, pain or diabetes medications, make sure you notify the surgery center/hospital before the day of surgery so they can tell you which medications you should take or avoid the day of surgery. 6. No food or drink after midnight the night before surgery unless directed otherwise by surgical center/hospital staff. 7. No alcoholic beverages 24-hours prior to surgery.  No smoking 24-hours prior or 24-hours after  surgery. 8. Wear loose pants or shorts. They should be loose enough to fit over bandages, boots, and casts. 9. Don't wear slip-on shoes. Sneakers are preferred. 10. Bring your boot with you to the surgery center/hospital.  Also bring crutches or a walker if your physician has prescribed it for you.  If you do not have this equipment, it will be provided for you after surgery. 11. If you have not been contacted by the surgery center/hospital by the day before your surgery, call to confirm the date and time of your surgery. 12. Leave-time from work may vary depending on the type of surgery you have.  Appropriate arrangements should be made prior to surgery with your employer. 13. Prescriptions will be provided immediately following surgery by your doctor.  Fill these as soon as possible after surgery and take the medication as directed. Pain medications will not be refilled on weekends and must be approved by the doctor. 14. Remove nail polish on the operative foot and avoid getting pedicures prior to surgery. 15. Wash the night before surgery.  The night before surgery wash the foot and leg well with water and the antibacterial soap provided. Be sure to pay special attention to beneath the toenails and in between the toes.  Wash for at least three (3) minutes. Rinse thoroughly with water and dry well with a towel.  Perform this wash unless told not to do so by your physician.  Enclosed: 1 Ice pack (please put in freezer the night before surgery)   1 Hibiclens skin cleaner     Pre-op instructions  If you have any questions regarding the instructions, please do not hesitate to call our office.  [place]: [address], [place], [place] [address] -- [phone]  [place]: [address] [place], [place] [address] -- [phone]  [place]: [address], [place], [place] [address] -- [phone]   Website: [email or web address] 

## 2019-12-03 NOTE — Progress Notes (Signed)
Subjective:  Patient ID: Chelsea Bray, female    DOB: Jun 29, 1971,  MRN: [id number] HPI Chief Complaint  Patient presents with  . Foot Pain    1st MPJ bilateral (L>R) - aching x several months, redness, shoes uncomfortable  . Diabetes    Last a1c was 8.2  . New Patient (Initial Visit)    49 y.o. female presents with the above complaint.   ROS: Denies fever chills nausea vomiting muscle aches pains calf pain back pain chest pain shortness of breath.  Past Medical History:  Diagnosis Date  . Acid reflux disease   . Arthritis   . Colon polyps 02/21/2017  . Diabetes (HCC) 11/17/2011  . Diabetes mellitus type 2 in obese (HCC) 11/17/2011  . History of esophageal stricture 02/21/2017  . Hyperlipidemia   . Hypertension   . IBS (irritable bowel syndrome)   . Insomnia 03/08/2016  . Lactose intolerance   . Morbid obesity (HCC) 01/10/2013  . Pain in joint, upper arm 11/08/2015  . Preventative health care 11/29/2013  . Sleep apnea    c pap  . Swelling of both lower extremities   . TOS (thoracic outlet syndrome) 11/20/2015  . Vaginitis and vulvovaginitis 12/23/2014  . Vitamin D deficiency    Past Surgical History:  Procedure Laterality Date  . BREAST REDUCTION SURGERY  2001  . CEREBRAL MICROVASCULAR DECOMPRESSION  2006   back of her head  . CHOLECYSTECTOMY    . COLONOSCOPY    . GALLBLADDER SURGERY  1991   gall stone removed  . MOLE REMOVAL    . POLYPECTOMY    . REDUCTION MAMMAPLASTY Bilateral 2001    Current Outpatient Medications:  .  Ascorbic Acid (VITAMIN C PO), Take by mouth daily., Disp: , Rfl:  .  atorvastatin (LIPITOR) 20 MG tablet, Take 1 tablet (20 mg total) by mouth daily., Disp: 90 tablet, Rfl: 3 .  azelastine (OPTIVAR) 0.05 % ophthalmic solution, Place 1 drop into both eyes 2 (two) times daily., Disp: 6 mL, Rfl: 1 .  BIOTIN PO, Take by mouth., Disp: , Rfl:  .  diphenoxylate-atropine (LOMOTIL) 2.5-0.025 MG tablet, Take 1 tablet by mouth 4 (four) times daily as needed for  diarrhea or loose stools., Disp: 30 tablet, Rfl: 1 .  glimepiride (AMARYL) 1 MG tablet, Take 1 tablet (1 mg total) by mouth daily with breakfast., Disp: 30 tablet, Rfl: 3 .  hydrochlorothiazide (HYDRODIURIL) 25 MG tablet, Take 1 tablet (25 mg total) by mouth daily., Disp: 90 tablet, Rfl: 1 .  hyoscyamine (LEVSIN SL) 0.125 MG SL tablet, Place 1 tablet (0.125 mg total) under the tongue every 4 (four) hours as needed., Disp: 30 tablet, Rfl: 1 .  Lactobacillus Rhamnosus, GG, (CULTURELLE PO), Take by mouth., Disp: , Rfl:  .  metFORMIN (GLUCOPHAGE XR) 500 MG 24 hr tablet, Take 1 tablet (500 mg total) by mouth 3 (three) times daily., Disp: 270 tablet, Rfl: 1 .  metoprolol succinate (TOPROL-XL) 50 MG 24 hr tablet, TAKE 1 TABLET BY MOUTH ONCE DAILY WITH FOOD OR  IMMEDIATELY  FOLLOWING  A  MEAL, Disp: 30 tablet, Rfl: 0 .  Multiple Vitamins-Minerals (MULTIVITAMIN ADULTS) TABS, Take by mouth daily., Disp: , Rfl:  .  naproxen (NAPROSYN) 375 MG tablet, Take 1 tablet (375 mg total) by mouth 2 (two) times daily with a meal., Disp: 60 tablet, Rfl: 1 .  NON FORMULARY, Tumeric, apple cider vinegar and ginger- 1 tablet daily, Disp: , Rfl:  .  Vitamin D, Ergocalciferol, (DRISDOL) 1.25 MG (  50000 UT) CAPS capsule, TAKE 1 CAPSULE BY MOUTH ONCE A WEEK, Disp: 12 capsule, Rfl: 1 .  zinc gluconate 50 MG tablet, Take 50 mg by mouth daily., Disp: , Rfl:   No Known Allergies Review of Systems Objective:   Vitals:   12/03/19 0841  BP: 109/84  Pulse: 98  Resp: 16    General: Well developed, nourished, in no acute distress, alert and oriented x3   Dermatological: Skin is warm, dry and supple bilateral. Nails x 10 are well maintained; remaining integument appears unremarkable at this time. There are no open sores, no preulcerative lesions, no rash or signs of infection present.  Vascular: Dorsalis Pedis artery and Posterior Tibial artery pedal pulses are 2/4 bilateral with immedate capillary fill time. Pedal hair growth  present. No varicosities and no lower extremity edema present bilateral.   Neruologic: Grossly intact via light touch bilateral. Vibratory intact via tuning fork bilateral. Protective threshold with Semmes Wienstein monofilament intact to all pedal sites bilateral. Patellar and Achilles deep tendon reflexes 2+ bilateral. No Babinski or clonus noted bilateral.   Musculoskeletal: No gross boney pedal deformities bilateral. No pain, crepitus, or limitation noted with foot and ankle range of motion bilateral. Muscular strength 5/5 in all groups tested bilateral.  Gait: Unassisted, Nonantalgic.    Radiographs:  Radiographs taken today demonstrate an increase in the first intermetatarsal angle greater than normal value and hallux abductus angles angle greater than normal value.  She has mild pes planus bilateral no coalitions in the rear foot are identifiable.  She does have a hypertrophic medial condyle to the head of the first metatarsal bilaterally no osteoarthritic changes of the first metatarsophalangeal joint bilaterally.  Mild tailor's bunion deformities bilateral with abduction of the fifth toe and a prominent fifth metatarsal head laterally.  Mild increase in the fourth intermetatarsal angle and the lateral deviation angle.  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  At this point she would like to go ahead and consider surgical intervention we consented her today for an [facility] bunion repair left foot with screws as well as the fifth metatarsal osteotomy with screws left foot.  I answered all the questions regarding these procedures to the best of my ability in layman's terms.  We discussed once again her history health history medications allergies her job.  We did discuss possible complications which may  include but are not limited to postop pain bleeding swelling infection recurrence need for further surgery all of these which could prolong her ability to get back to work.  She signed all 3 pages consent form we dispensed information regarding the surgery center anesthesia group and instructions for the morning of surgery.  We dispensed a Cam walker for postop recovery and I will follow-up with her in the near future for surgical intervention.     Roshonda Sperl T. [place], [place]

## 2019-12-04 ENCOUNTER — Telehealth: Payer: Self-pay

## 2019-12-04 NOTE — Telephone Encounter (Signed)
  Follow up Call-  Call back number 12/02/2019  Post procedure Call Back phone  # [phone]  Permission to leave phone message Yes  Some recent data might be hidden     Patient questions:  Do you have a fever, pain , or abdominal swelling? No. Pain Score  0 *  Have you tolerated food without any problems? Yes.    Have you been able to return to your normal activities? Yes.    Do you have any questions about your discharge instructions: Diet   No. Medications  No. Follow up visit  No.  Do you have questions or concerns about your Care? No.  Actions: * If pain score is 4 or above: No action needed, pain <4.  1. Have you developed a fever since your procedure? no  2.   Have you had an respiratory symptoms (SOB or cough) since your procedure? no  3.   Have you tested positive for COVID 19 since your procedure no  4.   Have you had any family members/close contacts diagnosed with the COVID 19 since your procedure?  no   If yes to any of these questions please route to Laverna Peace, RN and Jennye Boroughs, Charity fundraiser.

## 2019-12-21 ENCOUNTER — Other Ambulatory Visit: Payer: Self-pay

## 2019-12-21 ENCOUNTER — Encounter: Payer: Self-pay | Admitting: Obstetrics & Gynecology

## 2019-12-21 ENCOUNTER — Ambulatory Visit (INDEPENDENT_AMBULATORY_CARE_PROVIDER_SITE_OTHER): Payer: 59 | Admitting: Obstetrics & Gynecology

## 2019-12-21 VITALS — BP 148/87 | HR 88 | Wt 236.0 lb

## 2019-12-21 DIAGNOSIS — N939 Abnormal uterine and vaginal bleeding, unspecified: Secondary | ICD-10-CM | POA: Diagnosis not present

## 2019-12-21 DIAGNOSIS — Z3202 Encounter for pregnancy test, result negative: Secondary | ICD-10-CM

## 2019-12-21 DIAGNOSIS — Z30430 Encounter for insertion of intrauterine contraceptive device: Secondary | ICD-10-CM | POA: Diagnosis not present

## 2019-12-21 LAB — POCT URINE PREGNANCY: Preg Test, Ur: NEGATIVE

## 2019-12-21 MED ORDER — LEVONORGESTREL 19.5 MCG/DAY IU IUD: INTRAUTERINE_SYSTEM | Freq: Once | INTRAUTERINE | Status: AC

## 2019-12-21 NOTE — Progress Notes (Signed)
Here to discuss utrasound

## 2019-12-21 NOTE — Addendum Note (Signed)
Addended by: Lorelle Gibbs L on: 12/21/2019 11:40 AM   Modules accepted: Orders

## 2019-12-21 NOTE — Progress Notes (Signed)
History:  49 y.o. [id number] here today for eval of AUB. Pt had an [place] and is here to discuss the results. She works as a Naval architect. She has had no change in sx since her last visit.         The following portions of the patient's history were reviewed and updated as appropriate: allergies, current medications, past family history, past medical history, past social history, past surgical history and problem list.  Review of Systems:  Pertinent items are noted in HPI.    Objective:  Physical Exam Blood pressure (!) 148/87, pulse 88, weight 236 lb (107 kg), last menstrual period 12/14/2019.  CONSTITUTIONAL: Well-developed, well-nourished female in no acute distress.  HENT:  Normocephalic, atraumatic EYES: Conjunctivae and EOM are normal. No scleral icterus.  NECK: Normal range of motion SKIN: Skin is warm and dry. No rash noted. Not diaphoretic.No pallor. NEUROLGIC: Alert and oriented to person, place, and time. Normal coordination.   Pelvic: Normal appearing external genitalia; normal appearing vaginal mucosa and cervix.  Normal discharge.  Small uterus, no other palpable masses, no uterine or adnexal tenderness\  IUD Insertion Procedure Note Patient identified, informed consent performed.  Discussed risks of irregular bleeding, cramping, infection, malpositioning or misplacement of the IUD outside the uterus which may require further procedures. Time out was performed.  Urine pregnancy test negative.  Speculum placed in the vagina.  Cervix visualized.  Cleaned with Betadine x 2.  Grasped anteriorly with a single tooth tenaculum.  Uterus sounded to 10 cm. Liletta IUD placed per manufacturer's recommendations.  Strings trimmed to 3 cm. Tenaculum was removed, good hemostasis noted.  Patient tolerated procedure well.    Labs and Imaging 10/16/2019 CLINICAL DATA:  Uterine enlargement on exam  EXAM: ULTRASOUND PELVIS TRANSVAGINAL  TECHNIQUE: Transvaginal ultrasound examination of the  pelvis was performed including evaluation of the uterus, ovaries, adnexal regions, and pelvic cul-de-sac.  COMPARISON:  None  FINDINGS: Uterus  Measurements: 8.0 x 4.7 x 4.6 cm = volume: 90 mL. Anteverted. Heterogeneous myometrium. Multiple small leiomyomata versus heterogeneous areas of myometrium up to 11 mm diameter, some intramural, several potentially extending submucosal though the myometrial endometrial interface is poorly defined. Scattered areas of shadowing. Few tiny cystic foci.  Endometrium  Thickness: 6 mm.  No endometrial fluid  Right ovary  Not visualized, likely obscured by bowel  Left ovary  Not visualized, likely obscured by bowel  Other findings: No free pelvic fluid. No adnexal masses. Nabothian cysts at cervix.  IMPRESSION: Heterogeneous myometrium with ill-defined with scattered areas of shadowing arising from myometrium and poor definition of the endometrial complex, raising question of adenomyosis.  Question small uterine leiomyomata versus heterogeneous areas of adenomyosis.  Nonvisualization of ovaries.   Assessment & Plan:  AUB and dysmenorrhea thought due to adenomyosis. I have reviewed treatment options with pt including OCPs, LnIUD, surgery with endometrial ablation (which may make her pain worse) and hysterectomy, she opts for the LnIUD.   Patient was given post-procedure instructions.  Patient was asked to follow up in 4 weeks for IUD check.  Jak Haggar L. Harraway-Smith, M.D., [facility]

## 2019-12-21 NOTE — Patient Instructions (Signed)
Levonorgestrel intrauterine device (IUD) What is this medicine? LEVONORGESTREL IUD (LEE voe nor jes trel) is a contraceptive (birth control) device. The device is placed inside the uterus by a healthcare professional. It is used to prevent pregnancy. This device can also be used to treat heavy bleeding that occurs during your period. This medicine may be used for other purposes; ask your health care provider or pharmacist if you have questions. COMMON BRAND NAME(S): Kyleena, LILETTA, Mirena, Skyla What should I tell my health care provider before I take this medicine? They need to know if you have any of these conditions:  abnormal Pap smear  cancer of the breast, uterus, or cervix  diabetes  endometritis  genital or pelvic infection now or in the past  have more than one sexual partner or your partner has more than one partner  heart disease  history of an ectopic or tubal pregnancy  immune system problems  IUD in place  liver disease or tumor  problems with blood clots or take blood-thinners  seizures  use intravenous drugs  uterus of unusual shape  vaginal bleeding that has not been explained  an unusual or allergic reaction to levonorgestrel, other hormones, silicone, or polyethylene, medicines, foods, dyes, or preservatives  pregnant or trying to get pregnant  breast-feeding How should I use this medicine? This device is placed inside the uterus by a health care professional. Talk to your pediatrician regarding the use of this medicine in children. Special care may be needed. Overdosage: If you think you have taken too much of this medicine contact a poison control center or emergency room at once. NOTE: This medicine is only for you. Do not share this medicine with others. What if I miss a dose? This does not apply. Depending on the brand of device you have inserted, the device will need to be replaced every 3 to 6 years if you wish to continue using this type  of birth control. What may interact with this medicine? Do not take this medicine with any of the following medications:  amprenavir  bosentan  fosamprenavir This medicine may also interact with the following medications:  aprepitant  armodafinil  barbiturate medicines for inducing sleep or treating seizures  bexarotene  boceprevir  griseofulvin  medicines to treat seizures like carbamazepine, ethotoin, felbamate, oxcarbazepine, phenytoin, topiramate  modafinil  pioglitazone  rifabutin  rifampin  rifapentine  some medicines to treat HIV infection like atazanavir, efavirenz, indinavir, lopinavir, nelfinavir, tipranavir, ritonavir  St. John's wort  warfarin This list may not describe all possible interactions. Give your health care provider a list of all the medicines, herbs, non-prescription drugs, or dietary supplements you use. Also tell them if you smoke, drink alcohol, or use illegal drugs. Some items may interact with your medicine. What should I watch for while using this medicine? Visit your doctor or health care professional for regular check ups. See your doctor if you or your partner has sexual contact with others, becomes HIV positive, or gets a sexual transmitted disease. This product does not protect you against HIV infection (AIDS) or other sexually transmitted diseases. You can check the placement of the IUD yourself by reaching up to the top of your vagina with clean fingers to feel the threads. Do not pull on the threads. It is a good habit to check placement after each menstrual period. Call your doctor right away if you feel more of the IUD than just the threads or if you cannot feel the threads at   all. The IUD may come out by itself. You may become pregnant if the device comes out. If you notice that the IUD has come out use a backup birth control method like condoms and call your health care provider. Using tampons will not change the position of the  IUD and are okay to use during your period. This IUD can be safely scanned with magnetic resonance imaging (MRI) only under specific conditions. Before you have an MRI, tell your healthcare provider that you have an IUD in place, and which type of IUD you have in place. What side effects may I notice from receiving this medicine? Side effects that you should report to your doctor or health care professional as soon as possible:  allergic reactions like skin rash, itching or hives, swelling of the face, lips, or tongue  fever, flu-like symptoms  genital sores  high blood pressure  no menstrual period for 6 weeks during use  pain, swelling, warmth in the leg  pelvic pain or tenderness  severe or sudden headache  signs of pregnancy  stomach cramping  sudden shortness of breath  trouble with balance, talking, or walking  unusual vaginal bleeding, discharge  yellowing of the eyes or skin Side effects that usually do not require medical attention (report to your doctor or health care professional if they continue or are bothersome):  acne  breast pain  change in sex drive or performance  changes in weight  cramping, dizziness, or faintness while the device is being inserted  headache  irregular menstrual bleeding within first 3 to 6 months of use  nausea This list may not describe all possible side effects. Call your doctor for medical advice about side effects. You may report side effects to FDA at 1-800-FDA-1088. Where should I keep my medicine? This does not apply. NOTE: This sheet is a summary. It may not cover all possible information. If you have questions about this medicine, talk to your doctor, pharmacist, or health care provider.  2020 Elsevier/Gold Standard (2018-07-15 13:22:01) IUD PLACEMENT POST-PROCEDURE INSTRUCTIONS  1. You may take Ibuprofen, Aleve or Tylenol for pain if needed.  Cramping should resolve within in 24 hours.  2. You may have a small  amount of spotting.  You should wear a mini pad for the next few days.  3. You may have intercourse after 24 hours.  If you using this for birth control, it is effective immediately.  4. You need to call if you have any pelvic pain, fever, heavy bleeding or foul smelling vaginal discharge.  Irregular bleeding is common the first several months after having an IUD placed. You do not need to call for this reason unless you are concerned.  5. Shower or bathe as normal  6. You should have a follow-up appointment in 4-8 weeks for a re-check to make sure you are not having any problems. 

## 2020-01-18 ENCOUNTER — Ambulatory Visit: Payer: 59 | Admitting: Obstetrics & Gynecology

## 2020-01-29 ENCOUNTER — Other Ambulatory Visit: Payer: Self-pay | Admitting: Family Medicine

## 2020-01-29 DIAGNOSIS — I10 Essential (primary) hypertension: Secondary | ICD-10-CM

## 2020-02-01 MED ORDER — METOPROLOL SUCCINATE ER 50 MG PO TB24: ORAL_TABLET | ORAL | 0 refills | Status: DC

## 2020-02-08 ENCOUNTER — Other Ambulatory Visit: Payer: Self-pay

## 2020-02-08 ENCOUNTER — Ambulatory Visit (INDEPENDENT_AMBULATORY_CARE_PROVIDER_SITE_OTHER): Payer: 59 | Admitting: Obstetrics & Gynecology

## 2020-02-08 ENCOUNTER — Encounter: Payer: Self-pay | Admitting: Obstetrics & Gynecology

## 2020-02-08 VITALS — BP 130/85 | HR 92 | Ht 65.0 in | Wt 240.0 lb

## 2020-02-08 DIAGNOSIS — Z30431 Encounter for routine checking of intrauterine contraceptive device: Secondary | ICD-10-CM

## 2020-02-08 DIAGNOSIS — R102 Pelvic and perineal pain: Secondary | ICD-10-CM | POA: Diagnosis not present

## 2020-02-08 DIAGNOSIS — N939 Abnormal uterine and vaginal bleeding, unspecified: Secondary | ICD-10-CM | POA: Diagnosis not present

## 2020-02-08 NOTE — Patient Instructions (Signed)
Levonorgestrel intrauterine device (IUD) What is this medicine? LEVONORGESTREL IUD (LEE voe nor jes trel) is a contraceptive (birth control) device. The device is placed inside the uterus by a healthcare professional. It is used to prevent pregnancy. This device can also be used to treat heavy bleeding that occurs during your period. This medicine may be used for other purposes; ask your health care provider or pharmacist if you have questions. COMMON BRAND NAME(S): Kyleena, LILETTA, Mirena, Skyla What should I tell my health care provider before I take this medicine? They need to know if you have any of these conditions:  abnormal Pap smear  cancer of the breast, uterus, or cervix  diabetes  endometritis  genital or pelvic infection now or in the past  have more than one sexual partner or your partner has more than one partner  heart disease  history of an ectopic or tubal pregnancy  immune system problems  IUD in place  liver disease or tumor  problems with blood clots or take blood-thinners  seizures  use intravenous drugs  uterus of unusual shape  vaginal bleeding that has not been explained  an unusual or allergic reaction to levonorgestrel, other hormones, silicone, or polyethylene, medicines, foods, dyes, or preservatives  pregnant or trying to get pregnant  breast-feeding How should I use this medicine? This device is placed inside the uterus by a health care professional. Talk to your pediatrician regarding the use of this medicine in children. Special care may be needed. Overdosage: If you think you have taken too much of this medicine contact a poison control center or emergency room at once. NOTE: This medicine is only for you. Do not share this medicine with others. What if I miss a dose? This does not apply. Depending on the brand of device you have inserted, the device will need to be replaced every 3 to 6 years if you wish to continue using this type  of birth control. What may interact with this medicine? Do not take this medicine with any of the following medications:  amprenavir  bosentan  fosamprenavir This medicine may also interact with the following medications:  aprepitant  armodafinil  barbiturate medicines for inducing sleep or treating seizures  bexarotene  boceprevir  griseofulvin  medicines to treat seizures like carbamazepine, ethotoin, felbamate, oxcarbazepine, phenytoin, topiramate  modafinil  pioglitazone  rifabutin  rifampin  rifapentine  some medicines to treat HIV infection like atazanavir, efavirenz, indinavir, lopinavir, nelfinavir, tipranavir, ritonavir  St. John's wort  warfarin This list may not describe all possible interactions. Give your health care provider a list of all the medicines, herbs, non-prescription drugs, or dietary supplements you use. Also tell them if you smoke, drink alcohol, or use illegal drugs. Some items may interact with your medicine. What should I watch for while using this medicine? Visit your doctor or health care professional for regular check ups. See your doctor if you or your partner has sexual contact with others, becomes HIV positive, or gets a sexual transmitted disease. This product does not protect you against HIV infection (AIDS) or other sexually transmitted diseases. You can check the placement of the IUD yourself by reaching up to the top of your vagina with clean fingers to feel the threads. Do not pull on the threads. It is a good habit to check placement after each menstrual period. Call your doctor right away if you feel more of the IUD than just the threads or if you cannot feel the threads at   all. The IUD may come out by itself. You may become pregnant if the device comes out. If you notice that the IUD has come out use a backup birth control method like condoms and call your health care provider. Using tampons will not change the position of the  IUD and are okay to use during your period. This IUD can be safely scanned with magnetic resonance imaging (MRI) only under specific conditions. Before you have an MRI, tell your healthcare provider that you have an IUD in place, and which type of IUD you have in place. What side effects may I notice from receiving this medicine? Side effects that you should report to your doctor or health care professional as soon as possible:  allergic reactions like skin rash, itching or hives, swelling of the face, lips, or tongue  fever, flu-like symptoms  genital sores  high blood pressure  no menstrual period for 6 weeks during use  pain, swelling, warmth in the leg  pelvic pain or tenderness  severe or sudden headache  signs of pregnancy  stomach cramping  sudden shortness of breath  trouble with balance, talking, or walking  unusual vaginal bleeding, discharge  yellowing of the eyes or skin Side effects that usually do not require medical attention (report to your doctor or health care professional if they continue or are bothersome):  acne  breast pain  change in sex drive or performance  changes in weight  cramping, dizziness, or faintness while the device is being inserted  headache  irregular menstrual bleeding within first 3 to 6 months of use  nausea This list may not describe all possible side effects. Call your doctor for medical advice about side effects. You may report side effects to FDA at 1-800-FDA-1088. Where should I keep my medicine? This does not apply. NOTE: This sheet is a summary. It may not cover all possible information. If you have questions about this medicine, talk to your doctor, pharmacist, or health care provider.  2020 Elsevier/Gold Standard (2018-07-15 13:22:01)  

## 2020-02-08 NOTE — Progress Notes (Signed)
IUD string check. Patient had abnormal bleeding since insertion.  Armandina Stammer RN

## 2020-02-08 NOTE — Progress Notes (Signed)
  GYNECOLOGY OFFICE ENCOUNTER NOTE  History:  49 y.o. [id number] here today for today for IUD string check; Liletta  IUD was placed  12/21/2019. Pt reports that the pelvic pain has completely resolved however she has had continued bleeding She does feel that the bleeding is significantly less in amount. She was not expecting the daily spotting for 2 weeks. There are no concerning side effects.  The following portions of the patient's history were reviewed and updated as appropriate: allergies, current medications, past family history, past medical history, past social history, past surgical history and problem list..  Review of Systems:  Pertinent items are noted in HPI.   Objective:  Physical Exam Blood pressure 130/85, pulse 92, height 5\' 5"  (1.651 m), weight 240 lb (108.9 kg). CONSTITUTIONAL: Well-developed, well-nourished female in no acute distress.  HENT:  Normocephalic, atraumatic. External right and left ear normal. Oropharynx is clear and moist EYES: Conjunctivae and EOM are normal. Pupils are equal, round, and reactive to light. No scleral icterus.  NECK: Normal range of motion, supple, no masses CARDIOVASCULAR: Normal heart rate noted RESPIRATORY: Effort and breath sounds normal, no problems with respiration noted ABDOMEN: Soft, no distention noted.   PELVIC: Normal appearing external genitalia; normal appearing vaginal mucosa and cervix.  IUD strings visualized, about 6 cm in length outside cervix. The strings were trimmed to 3 cm.   Assessment & Plan:  AUB and Pelvic pain- both improved with the LnIUD. Pt is still concerned about the bleeding pattern  F/u in 2 months   Reviewed sx and S.E. related to the LnIUD. All questions answered  Pt gets her 2nd Covid vaccine today    Ladona Rosten L. Harraway-Smith, MD, FACOG Obstetrician & Gynecologist, [facility] for [facility]

## 2020-02-26 ENCOUNTER — Encounter: Payer: Self-pay | Admitting: Family Medicine

## 2020-04-11 ENCOUNTER — Telehealth: Payer: 59 | Admitting: Obstetrics & Gynecology

## 2020-04-14 ENCOUNTER — Ambulatory Visit: Payer: 59 | Admitting: Family Medicine

## 2020-04-18 ENCOUNTER — Other Ambulatory Visit: Payer: Self-pay

## 2020-04-18 ENCOUNTER — Ambulatory Visit: Payer: 59 | Admitting: Family Medicine

## 2020-04-18 ENCOUNTER — Ambulatory Visit (HOSPITAL_BASED_OUTPATIENT_CLINIC_OR_DEPARTMENT_OTHER)
Admission: RE | Admit: 2020-04-18 | Discharge: 2020-04-18 | Disposition: A | Discharge status: Home / Self Care | Payer: 59 | Source: Ambulatory Visit | Attending: Medical | Admitting: Medical

## 2020-04-18 ENCOUNTER — Ambulatory Visit: Payer: 59 | Admitting: Medical

## 2020-04-18 VITALS — BP 143/85 | HR 99 | Resp 18 | Ht 65.0 in | Wt 239.2 lb

## 2020-04-18 DIAGNOSIS — R600 Localized edema: Secondary | ICD-10-CM | POA: Diagnosis not present

## 2020-04-18 DIAGNOSIS — M2550 Pain in unspecified joint: Secondary | ICD-10-CM | POA: Diagnosis not present

## 2020-04-18 DIAGNOSIS — E1165 Type 2 diabetes mellitus with hyperglycemia: Secondary | ICD-10-CM | POA: Diagnosis not present

## 2020-04-18 LAB — COMPREHENSIVE METABOLIC PANEL
ALT: 18 U/L (ref 0–35)
AST: 14 U/L (ref 0–37)
Albumin: 4 g/dL (ref 3.5–5.2)
Alkaline Phosphatase: 63 U/L (ref 39–117)
BUN: 13 mg/dL (ref 6–23)
CO2: 28 mEq/L (ref 19–32)
Calcium: 9.7 mg/dL (ref 8.4–10.5)
Chloride: 102 mEq/L (ref 96–112)
Creatinine, Ser: 0.92 mg/dL (ref 0.40–1.20)
GFR: 78.23 mL/min (ref >60.00)
Glucose, Bld: 283 mg/dL — ABNORMAL HIGH (ref 70–99)
Potassium: 4.3 mEq/L (ref 3.5–5.1)
Sodium: 137 mEq/L (ref 135–145)
Total Bilirubin: 0.4 mg/dL (ref 0.2–1.2)
Total Protein: 6.7 g/dL (ref 6.0–8.3)

## 2020-04-18 LAB — BRAIN NATRIURETIC PEPTIDE: Pro B Natriuretic peptide (BNP): 33 pg/mL (ref 0.0–100.0)

## 2020-04-18 LAB — HEMOGLOBIN A1C: Hgb A1c MFr Bld: 9.6 % — ABNORMAL HIGH (ref 4.6–6.5)

## 2020-04-18 NOTE — Patient Instructions (Addendum)
For diabetes will get cmp and a1c. Then decide on adding med. 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  For arthralgia can restart flax seed oil as you report that helped. Let me know if that does not help. You can get back in with your specialist. If you need referral let me know.  For pedal edema I do think you have dependant edema. Raise legs daily after work. Use compression house stocking. If with your job you ever get asymetric swelling or poplitial pain then need to get [place]. Will get cxr and bnp today.  Follow up 3 months or as needed

## 2020-04-18 NOTE — Progress Notes (Signed)
Subjective:    Patient ID: Chelsea Bray, female    DOB: October 24, 1970, 49 y.o.   MRN: [id number]  HPI  Pt in for follow up.   Pt last a1c was 8.1 Pt states in past she was on metformin 3 times a day. She had severe side effects with metformin. Had diarrhea. With metformin sugar was elevated.   Pt states lows sugar 132. Highest was 221.   Sometimes fasting in the morning was 200.  Pt checks fasting in the morning and most meals.   She has stopped drinking sodas.   Pt states was eating a lot of yogurt and realized had a lot of sugars.  Pt exercises once or twice a week.   Pt bp little high but she did not check her medication today.    Review of Systems  Constitutional: Negative for chills, fatigue and fever.  Respiratory: Negative for cough, chest tightness, shortness of breath and wheezing.   Cardiovascular: Negative for chest pain and palpitations.  Gastrointestinal: Negative for abdominal pain, anal bleeding and constipation.  Genitourinary: Negative for dysuria, flank pain and frequency.  Musculoskeletal: Positive for arthralgias. Negative for back pain and myalgias.       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 When she lifts legs after driving. Legs will decrease swelling. No popliteal pain.    Some arthralgia after work and some rt hip area. Sometimes pain in morning.  Some hand pain after driving.  Skin: Negative for rash.  Neurological: Negative for dizziness, syncope, weakness, numbness and headaches.  Hematological: Negative for adenopathy. Does not bruise/bleed easily.  Psychiatric/Behavioral: Negative for behavioral problems, decreased concentration and sleep disturbance. The patient is not nervous/anxious.      Past Medical History:  Diagnosis Date  . Acid reflux disease   . Arthritis   . Colon polyps 02/21/2017  . Diabetes (HCC) 11/17/2011  . Diabetes mellitus type 2 in obese (HCC) 11/17/2011  . History of esophageal stricture 02/21/2017    . Hyperlipidemia   . Hypertension   . IBS (irritable bowel syndrome)   . Insomnia 03/08/2016  . Lactose intolerance   . Morbid obesity (HCC) 01/10/2013  . Pain in joint, upper arm 11/08/2015  . Preventative health care 11/29/2013  . Sleep apnea    c pap  . Swelling of both lower extremities   . TOS (thoracic outlet syndrome) 11/20/2015  . Vaginitis and vulvovaginitis 12/23/2014  . Vitamin D deficiency      Social History   Socioeconomic History  . Marital status: Single    Spouse name: Not on file  . Number of children: Not on file  . Years of education: Not on file  . Highest education level: Not on file  Occupational History  . Occupation: Midwife  Tobacco Use  . Smoking status: Never Smoker  . Smokeless tobacco: Never Used  Vaping Use  . Vaping Use: Never used  Substance and Sexual Activity  . Alcohol use: Yes    Alcohol/week: 0.0 standard drinks    Comment: wine ocassion  . Drug use: No  . Sexual activity: Yes    Birth control/protection: None, Surgical    Comment: avoid dairy, lives with husband, daughters. works at school bus driver  Other Topics Concern  . Not on file  Social History Narrative  . Not on file   Social Determinants of Health   Financial Resource Strain:   . Difficulty of Paying Living Expenses:   Food Insecurity:   .  Worried About Programme researcher, broadcasting/film/video in the Last Year:   . Barista in the Last Year:   Transportation Needs:   . Freight forwarder (Medical):   Marland Kitchen Lack of Transportation (Non-Medical):   Physical Activity:   . Days of Exercise per Week:   . Minutes of Exercise per Session:   Stress:   . Feeling of Stress :   Social Connections:   . Frequency of Communication with Friends and Family:   . Frequency of Social Gatherings with Friends and Family:   . Attends Religious Services:   . Active Member of Clubs or Organizations:   . Attends Banker Meetings:   Marland Kitchen Marital Status:   Intimate Partner Violence:    . Fear of Current or Ex-Partner:   . Emotionally Abused:   Marland Kitchen Physically Abused:   . Sexually Abused:     Past Surgical History:  Procedure Laterality Date  . BREAST REDUCTION SURGERY  2001  . CEREBRAL MICROVASCULAR DECOMPRESSION  2006   back of her head  . CHOLECYSTECTOMY    . COLONOSCOPY    . GALLBLADDER SURGERY  1991   gall stone removed  . MOLE REMOVAL    . POLYPECTOMY    . REDUCTION MAMMAPLASTY Bilateral 2001    Family History  Problem Relation Age of Onset  . Heart disease Maternal Grandmother        MI  . Stroke Maternal Grandmother   . Diabetes Maternal Grandmother   . Arrhythmia Mother   . Diabetes Mother        maternal grandmother and aunts  . Hypertension Mother   . Colon polyps Mother   . Heart attack Father   . Stroke Father   . Hyperlipidemia Father   . Hypertension Father   . Cancer Father        lung, smoker  . Heart disease Father        CABG x 4  . Alcoholism Father   . Prostate cancer Maternal Grandfather   . Cancer Maternal Grandfather        colon cancer, prostate  . Colon cancer Maternal Grandfather   . Alcohol abuse Paternal Grandmother        died of pneumonia  . Heart disease Paternal Grandfather        MI  . Asthma Daughter        allergies  . Hypertension Other        parents and maternal parents  . Breast cancer Neg Hx   . Esophageal cancer Neg Hx   . Rectal cancer Neg Hx   . Stomach cancer Neg Hx     No Known Allergies  Current Outpatient Medications on File Prior to Visit  Medication Sig Dispense Refill  . Ascorbic Acid (VITAMIN C PO) Take by mouth daily.    Marland Kitchen atorvastatin (LIPITOR) 20 MG tablet Take 1 tablet (20 mg total) by mouth daily. 90 tablet 3  . azelastine (OPTIVAR) 0.05 % ophthalmic solution Place 1 drop into both eyes 2 (two) times daily. 6 mL 1  . BIOTIN PO Take by mouth.    . diphenoxylate-atropine (LOMOTIL) 2.5-0.025 MG tablet Take 1 tablet by mouth 4 (four) times daily as needed for diarrhea or loose  stools. 30 tablet 1  . glimepiride (AMARYL) 1 MG tablet Take 1 tablet (1 mg total) by mouth daily with breakfast. 30 tablet 3  . hydrochlorothiazide (HYDRODIURIL) 25 MG tablet Take 1 tablet (25 mg total) by  mouth daily. 90 tablet 1  . hyoscyamine (LEVSIN SL) 0.125 MG SL tablet Place 1 tablet (0.125 mg total) under the tongue every 4 (four) hours as needed. 30 tablet 1  . Lactobacillus Rhamnosus, GG, (CULTURELLE PO) Take by mouth.    . metFORMIN (GLUCOPHAGE XR) 500 MG 24 hr tablet Take 1 tablet (500 mg total) by mouth 3 (three) times daily. 270 tablet 1  . metoprolol succinate (TOPROL-XL) 50 MG 24 hr tablet TAKE 1 TABLET BY MOUTH ONCE DAILY WITH FOOD OR  IMMEDIATELY  FOLLOWING  A  MEAL 30 tablet 0  . Multiple Vitamins-Minerals (MULTIVITAMIN ADULTS) TABS Take by mouth daily.    . naproxen (NAPROSYN) 375 MG tablet Take 1 tablet (375 mg total) by mouth 2 (two) times daily with a meal. 60 tablet 1  . NON FORMULARY Tumeric, apple cider vinegar and ginger- 1 tablet daily    . Vitamin D, Ergocalciferol, (DRISDOL) 1.25 MG (50000 UT) CAPS capsule TAKE 1 CAPSULE BY MOUTH ONCE A WEEK 12 capsule 1  . zinc gluconate 50 MG tablet Take 50 mg by mouth daily.     No current facility-administered medications on file prior to visit.    BP (!) 143/85   Pulse 99   Resp 18   Ht 5\' 5"  (1.651 m)   Wt (!) 239 lb 3.2 oz (108.5 kg)   LMP 04/12/2020 (Approximate)   SpO2 99%   BMI 39.80 kg/m       Objective:   Physical Exam   General Mental Status- Alert. General Appearance- Not in acute distress.   Skin General: Color- Normal Color. Moisture- Normal Moisture.  Neck Carotid Arteries- Normal color. Moisture- Normal Moisture. No carotid bruits. No JVD.  Chest and Lung Exam Auscultation: Breath Sounds:-Normal.  Cardiovascular Auscultation:Rythm- Regular. Murmurs & Other Heart Sounds:Auscultation of the heart reveals- No Murmurs.  Abdomen Inspection:-Inspeection Normal. Palpation/Percussion:Note:No  mass. Palpation and Percussion of the abdomen reveal- Non Tender, Non Distended + BS, no rebound or guarding.   Neurologic Cranial Nerve exam:- CN III-XII intact(No nystagmus), symmetric smile. Strength:- 5/5 equal and symmetric strength both upper and lower extremities.  Lower ext- faint 1+ pedal edema bilaterally at ankle  Level. Negative homan sign bilaterally.       Assessment & Plan:  For diabetes will get cmp and a1c. Then decide on adding med. 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  For arthralgia can restart flax seed oil as you report that helped. Let me know if that does not help. You can get back in with your specialist. If you need referral let me know.  For pedal edema I do think you have dependant edema. Raise legs daily after work. Use compression house stocking. If with your job you ever get asymetric swelling or poplitial pain then need to get 04/14/2020. Will get cxr and bnp today.  Follow up 3 months or as needed  [place], PA-C   Time spent with patient today was  30 minutes which consisted of chart review, discussing diagnosis, work up,  treatment and documentation.

## 2020-05-02 ENCOUNTER — Telehealth: Payer: Self-pay | Admitting: Podiatry

## 2020-05-02 NOTE — Telephone Encounter (Signed)
I'm scheduled for sx on 9/17. I need to cxl that sx and reschedule it. Thank you.

## 2020-05-04 ENCOUNTER — Other Ambulatory Visit: Payer: Self-pay | Admitting: Family Medicine

## 2020-05-19 ENCOUNTER — Encounter: Payer: 59 | Admitting: Podiatry

## 2020-05-26 ENCOUNTER — Encounter: Payer: 59 | Admitting: Podiatry

## 2020-05-27 ENCOUNTER — Telehealth: Payer: Self-pay

## 2020-05-27 NOTE — Telephone Encounter (Signed)
Chelsea Bray called to cancel her surgery scheduled for 06/03/20 with Dr. Al Corpus. She stated she wanted to wait till next year before having surgery. I told her I would cancel everything and for her to schedule an appointment with Dr. Al Corpus when she was ready to reschedule. She will need to sign new consent forms. Notified [name] at [facility] and Dr. Al Corpus.

## 2020-05-30 ENCOUNTER — Encounter: Payer: Self-pay | Admitting: Family Medicine

## 2020-05-30 ENCOUNTER — Other Ambulatory Visit: Payer: Self-pay | Admitting: Family Medicine

## 2020-05-30 DIAGNOSIS — I10 Essential (primary) hypertension: Secondary | ICD-10-CM

## 2020-05-30 MED ORDER — SAXAGLIPTIN HCL 5 MG PO TABS: 5.0000 mg | ORAL_TABLET | Freq: Every day | ORAL | 2 refills | Status: DC

## 2020-05-30 MED ORDER — METOPROLOL SUCCINATE ER 50 MG PO TB24: ORAL_TABLET | ORAL | 1 refills | Status: DC

## 2020-05-30 MED ORDER — GLIMEPIRIDE 1 MG PO TABS: 1.0000 mg | ORAL_TABLET | Freq: Two times a day (BID) | ORAL | 0 refills | Status: DC

## 2020-05-30 MED ORDER — METFORMIN HCL ER 500 MG PO TB24: 500.0000 mg | ORAL_TABLET | Freq: Every day | ORAL | 0 refills | Status: DC

## 2020-06-02 ENCOUNTER — Telehealth: Payer: Self-pay | Admitting: *Deleted

## 2020-06-02 MED ORDER — SITAGLIPTIN PHOSPHATE 100 MG PO TABS
100.0000 mg | ORAL_TABLET | Freq: Every day | ORAL | 5 refills | Status: DC
Start: 2020-06-02 — End: 2020-08-10

## 2020-06-02 NOTE — Telephone Encounter (Signed)
Rx sent 

## 2020-06-02 NOTE — Telephone Encounter (Signed)
OK try Januvia 100 mg tabs, 1 tab po daily disp #30 with 5 rf or #90 with 1 at patient discretion

## 2020-06-02 NOTE — Addendum Note (Signed)
Addended byConrad Luxemburg D on: 06/02/2020 01:31 PM   Modules accepted: Orders

## 2020-06-02 NOTE — Telephone Encounter (Signed)
Insurance does not cover Onglyza.    Insurance will cover Januvia or Janumet

## 2020-06-09 ENCOUNTER — Encounter: Payer: 59 | Admitting: Podiatry

## 2020-06-16 ENCOUNTER — Encounter: Payer: 59 | Admitting: Podiatry

## 2020-06-23 ENCOUNTER — Encounter: Payer: 59 | Admitting: Podiatry

## 2020-06-30 ENCOUNTER — Encounter: Payer: 59 | Admitting: Podiatry

## 2020-07-14 ENCOUNTER — Encounter: Payer: 59 | Admitting: Podiatry

## 2020-07-25 ENCOUNTER — Encounter: Payer: 59 | Admitting: Family Medicine

## 2020-08-08 ENCOUNTER — Other Ambulatory Visit: Payer: Self-pay

## 2020-08-08 ENCOUNTER — Ambulatory Visit (INDEPENDENT_AMBULATORY_CARE_PROVIDER_SITE_OTHER): Payer: 59 | Admitting: Medical

## 2020-08-08 VITALS — BP 148/91 | HR 77 | Resp 18 | Ht 65.0 in | Wt 242.0 lb

## 2020-08-08 DIAGNOSIS — Z23 Encounter for immunization: Secondary | ICD-10-CM

## 2020-08-08 DIAGNOSIS — G5603 Carpal tunnel syndrome, bilateral upper limbs: Secondary | ICD-10-CM | POA: Diagnosis not present

## 2020-08-08 DIAGNOSIS — Z0000 Encounter for general adult medical examination without abnormal findings: Secondary | ICD-10-CM

## 2020-08-08 DIAGNOSIS — I10 Essential (primary) hypertension: Secondary | ICD-10-CM | POA: Diagnosis not present

## 2020-08-08 DIAGNOSIS — E1165 Type 2 diabetes mellitus with hyperglycemia: Secondary | ICD-10-CM

## 2020-08-08 DIAGNOSIS — Z6841 Body Mass Index (BMI) 40.0 and over, adult: Secondary | ICD-10-CM

## 2020-08-08 DIAGNOSIS — Z1231 Encounter for screening mammogram for malignant neoplasm of breast: Secondary | ICD-10-CM

## 2020-08-08 LAB — CBC WITH DIFFERENTIAL/PLATELET
Basophils Absolute: 0 10*3/uL (ref 0.0–0.1)
Basophils Relative: 0.5 % (ref 0.0–3.0)
Eosinophils Absolute: 0.1 10*3/uL (ref 0.0–0.7)
Eosinophils Relative: 0.9 % (ref 0.0–5.0)
HCT: 40.8 % (ref 36.0–46.0)
Hemoglobin: 13.6 g/dL (ref 12.0–15.0)
Lymphocytes Relative: 20.8 % (ref 12.0–46.0)
Lymphs Abs: 1.7 10*3/uL (ref 0.7–4.0)
MCHC: 33.3 g/dL (ref 30.0–36.0)
MCV: 91.9 fl (ref 78.0–100.0)
Monocytes Absolute: 0.5 10*3/uL (ref 0.1–1.0)
Monocytes Relative: 5.5 % (ref 3.0–12.0)
Neutro Abs: 5.9 10*3/uL (ref 1.4–7.7)
Neutrophils Relative %: 72.3 % (ref 43.0–77.0)
Platelets: 303 10*3/uL (ref 150.0–400.0)
RBC: 4.44 Mil/uL (ref 3.87–5.11)
RDW: 14.1 % (ref 11.5–15.5)
WBC: 8.2 10*3/uL (ref 4.0–10.5)

## 2020-08-08 LAB — COMPREHENSIVE METABOLIC PANEL
ALT: 18 U/L (ref 0–35)
AST: 15 U/L (ref 0–37)
Albumin: 4 g/dL (ref 3.5–5.2)
Alkaline Phosphatase: 57 U/L (ref 39–117)
BUN: 9 mg/dL (ref 6–23)
CO2: 29 mEq/L (ref 19–32)
Calcium: 9.2 mg/dL (ref 8.4–10.5)
Chloride: 101 mEq/L (ref 96–112)
Creatinine, Ser: 1 mg/dL (ref 0.40–1.20)
GFR: 65.85 mL/min (ref >60.00)
Glucose, Bld: 197 mg/dL — ABNORMAL HIGH (ref 70–99)
Potassium: 4.3 mEq/L (ref 3.5–5.1)
Sodium: 138 mEq/L (ref 135–145)
Total Bilirubin: 0.5 mg/dL (ref 0.2–1.2)
Total Protein: 6.6 g/dL (ref 6.0–8.3)

## 2020-08-08 LAB — LIPID PANEL
Cholesterol: 157 mg/dL (ref 0–200)
HDL: 39.6 mg/dL (ref >39.00)
LDL Cholesterol: 98 mg/dL (ref 0–99)
NonHDL: 117.56
Total CHOL/HDL Ratio: 4
Triglycerides: 98 mg/dL (ref 0.0–149.0)
VLDL: 19.6 mg/dL (ref 0.0–40.0)

## 2020-08-08 LAB — HEMOGLOBIN A1C: Hgb A1c MFr Bld: 9.2 % — ABNORMAL HIGH (ref 4.6–6.5)

## 2020-08-08 NOTE — Progress Notes (Signed)
Subjective:    Patient ID: Chelsea Bray, female    DOB: October 16, 1970, 49 y.o.   MRN: [id number]  HPI  Pt in for cpe/wellness exam.  Pt is fasting. Pt has been walking little more/only once a week. Working out of town prevented from working out. Pt states diet is better/less sugar.   3 months ago a1c was 9.6. Pt states above 500 mg once daily is bother her stomach and she had diarrhea. Pt is also on amryl 2 mg daily. Also added onglyza. But had to switch to [place]. Would cost her 200 dollars a month.   Pt got flu vaccine today. Got covid vaccine.  Pt mammogram is up to date.  April 2021 pap done and negative.    Review of Systems  Constitutional: Negative for chills, fatigue and fever.  Respiratory: Negative for cough, chest tightness, shortness of breath and wheezing.   Cardiovascular: Negative for chest pain and palpitations.  Gastrointestinal: Negative for abdominal pain, blood in stool, diarrhea and nausea.  Genitourinary: Negative for dysuria and urgency.  Musculoskeletal: Negative for back pain and joint swelling.  Skin: Negative for rash.  Neurological: Negative for dizziness, seizures, speech difficulty, weakness and light-headedness.       Randome tingle in hands but no motor funtion issues. Night time sometems wore.  Hematological: Negative for adenopathy. Does not bruise/bleed easily.  Psychiatric/Behavioral: Negative for behavioral problems and confusion.    Past Medical History:  Diagnosis Date  . Acid reflux disease   . Arthritis   . Colon polyps 02/21/2017  . Diabetes (HCC) 11/17/2011  . Diabetes mellitus type 2 in obese (HCC) 11/17/2011  . History of esophageal stricture 02/21/2017  . Hyperlipidemia   . Hypertension   . IBS (irritable bowel syndrome)   . Insomnia 03/08/2016  . Lactose intolerance   . Morbid obesity (HCC) 01/10/2013  . Pain in joint, upper arm 11/08/2015  . Preventative health care 11/29/2013  . Sleep apnea    c pap  . Swelling of both  lower extremities   . TOS (thoracic outlet syndrome) 11/20/2015  . Vaginitis and vulvovaginitis 12/23/2014  . Vitamin D deficiency      Social History   Socioeconomic History  . Marital status: Single    Spouse name: Not on file  . Number of children: Not on file  . Years of education: Not on file  . Highest education level: Not on file  Occupational History  . Occupation: Midwife  Tobacco Use  . Smoking status: Never Smoker  . Smokeless tobacco: Never Used  Vaping Use  . Vaping Use: Never used  Substance and Sexual Activity  . Alcohol use: Yes    Alcohol/week: 0.0 standard drinks    Comment: wine ocassion  . Drug use: No  . Sexual activity: Yes    Birth control/protection: None, Surgical    Comment: avoid dairy, lives with husband, daughters. works at school bus driver  Other Topics Concern  . Not on file  Social History Narrative  . Not on file   Social Determinants of Health   Financial Resource Strain:   . Difficulty of Paying Living Expenses: Not on file  Food Insecurity:   . Worried About Programme researcher, broadcasting/film/video in the Last Year: Not on file  . Ran Out of Food in the Last Year: Not on file  Transportation Needs:   . Lack of Transportation (Medical): Not on file  . Lack of Transportation (Non-Medical): Not on file  Physical  Activity:   . Days of Exercise per Week: Not on file  . Minutes of Exercise per Session: Not on file  Stress:   . Feeling of Stress : Not on file  Social Connections:   . Frequency of Communication with Friends and Family: Not on file  . Frequency of Social Gatherings with Friends and Family: Not on file  . Attends Religious Services: Not on file  . Active Member of Clubs or Organizations: Not on file  . Attends Banker Meetings: Not on file  . Marital Status: Not on file  Intimate Partner Violence:   . Fear of Current or Ex-Partner: Not on file  . Emotionally Abused: Not on file  . Physically Abused: Not on file  . Sexually  Abused: Not on file    Past Surgical History:  Procedure Laterality Date  . BREAST REDUCTION SURGERY  2001  . CEREBRAL MICROVASCULAR DECOMPRESSION  2006   back of her head  . CHOLECYSTECTOMY    . COLONOSCOPY    . GALLBLADDER SURGERY  1991   gall stone removed  . MOLE REMOVAL    . POLYPECTOMY    . REDUCTION MAMMAPLASTY Bilateral 2001    Family History  Problem Relation Age of Onset  . Heart disease Maternal Grandmother        MI  . Stroke Maternal Grandmother   . Diabetes Maternal Grandmother   . Arrhythmia Mother   . Diabetes Mother        maternal grandmother and aunts  . Hypertension Mother   . Colon polyps Mother   . Heart attack Father   . Stroke Father   . Hyperlipidemia Father   . Hypertension Father   . Cancer Father        lung, smoker  . Heart disease Father        CABG x 4  . Alcoholism Father   . Prostate cancer Maternal Grandfather   . Cancer Maternal Grandfather        colon cancer, prostate  . Colon cancer Maternal Grandfather   . Alcohol abuse Paternal Grandmother        died of pneumonia  . Heart disease Paternal Grandfather        MI  . Asthma Daughter        allergies  . Hypertension Other        parents and maternal parents  . Breast cancer Neg Hx   . Esophageal cancer Neg Hx   . Rectal cancer Neg Hx   . Stomach cancer Neg Hx     No Known Allergies  Current Outpatient Medications on File Prior to Visit  Medication Sig Dispense Refill  . Ascorbic Acid (VITAMIN C PO) Take by mouth daily.    Marland Kitchen atorvastatin (LIPITOR) 20 MG tablet Take 1 tablet (20 mg total) by mouth daily. 90 tablet 3  . azelastine (OPTIVAR) 0.05 % ophthalmic solution Place 1 drop into both eyes 2 (two) times daily. 6 mL 1  . BIOTIN PO Take by mouth.    . diphenoxylate-atropine (LOMOTIL) 2.5-0.025 MG tablet TAKE 1 TABLET BY MOUTH 4 TIMES DAILY AS NEEDED FOR DIARRHEA OR  LOOSE  STOOLS 4 tablet 0  . glimepiride (AMARYL) 1 MG tablet Take 1 tablet (1 mg total) by mouth in the  morning and at bedtime. 180 tablet 0  . hydrochlorothiazide (HYDRODIURIL) 25 MG tablet Take 1 tablet (25 mg total) by mouth daily. 90 tablet 1  . hyoscyamine (LEVSIN SL) 0.125 MG  SL tablet Place 1 tablet (0.125 mg total) under the tongue every 4 (four) hours as needed. 30 tablet 1  . Lactobacillus Rhamnosus, GG, (CULTURELLE PO) Take by mouth.    . metFORMIN (GLUCOPHAGE XR) 500 MG 24 hr tablet Take 1 tablet (500 mg total) by mouth daily with breakfast. 90 tablet 0  . metoprolol succinate (TOPROL-XL) 50 MG 24 hr tablet TAKE 1 TABLET BY MOUTH ONCE DAILY WITH FOOD OR  IMMEDIATELY  FOLLOWING  A  MEAL 90 tablet 1  . Multiple Vitamins-Minerals (MULTIVITAMIN ADULTS) TABS Take by mouth daily.    . naproxen (NAPROSYN) 375 MG tablet Take 1 tablet (375 mg total) by mouth 2 (two) times daily with a meal. 60 tablet 1  . NON FORMULARY Tumeric, apple cider vinegar and ginger- 1 tablet daily    . sitaGLIPtin (JANUVIA) 100 MG tablet Take 1 tablet (100 mg total) by mouth daily. 30 tablet 5  . Vitamin D, Ergocalciferol, (DRISDOL) 1.25 MG (50000 UT) CAPS capsule TAKE 1 CAPSULE BY MOUTH ONCE A WEEK 12 capsule 1  . zinc gluconate 50 MG tablet Take 50 mg by mouth daily.     No current facility-administered medications on file prior to visit.    BP (!) 148/91   Pulse 77   Resp 18   Ht 5\' 5"  (1.651 m)   Wt 242 lb (109.8 kg)   SpO2 95%   BMI 40.27 kg/m       Objective:   Physical Exam  General Mental Status- Alert. General Appearance- Not in acute distress.   Skin General: Color- Normal Color. Moisture- Normal Moisture.  Neck Carotid Arteries- Normal color. Moisture- Normal Moisture. No carotid bruits. No JVD.  Chest and Lung Exam Auscultation: Breath Sounds:-Normal.  Cardiovascular Auscultation:Rythm- Regular. Murmurs & Other Heart Sounds:Auscultation of the heart reveals- No Murmurs.  Abdomen Inspection:-Inspeection Normal. Palpation/Percussion:Note:No mass. Palpation and Percussion of the  abdomen reveal- Non Tender, Non Distended + BS, no rebound or guarding.   Neurologic Cranial Nerve exam:- CN III-XII intact(No nystagmus), symmetric smile. Strength:- 5/5 equal and symmetric strength both upper and lower extremities.      Assessment & Plan:  For you wellness exam today I have ordered cbc, cmp, lipid panel and a1c.  Vaccine pneumonia vaccine psv 13.   Recommend exercise and healthy diet.  We will let you know lab results as they come in.  Follow up date appointment will be determined after lab review.   For diabetes will repeat a1c today and see if need to change med regimen.  htn- continue metoprolol. Bp borderline today at 140/80 but no med for 2 days. Please restart.  Pt is obese. Will refer to wt loss clinic.   Placed mammogram order today.  , Esperanza Richters   [place] as as addressed diabetes, htn . carepel tunnel syndromeand referred to weight loss management.

## 2020-08-08 NOTE — Patient Instructions (Addendum)
For you wellness exam today I have ordered cbc, cmp, lipid panel and a1c.  Vaccine pneumonia vaccine psv 13.   Recommend exercise and healthy diet.  We will let you know lab results as they come in.  Follow up date appointment will be determined after lab review.   For diabetes will repeat a1c today and see if need to change med regimen.  htn- continue metoprolol. Bp borderline today at 140/80 but no med for 2 days. Please restart.   Pt is obese. Will refer to wt loss clinic.   Placed mammogram order today.  Conservative measure carpel tunnel discussed. If bp controlle low dose ibuprofen and wrist cock up splint.   Preventive Care 50-67 Years Old, Female Preventive care refers to visits with your health care provider and lifestyle choices that can promote health and wellness. This includes:  A yearly physical exam. This may also be called an annual well check.  Regular dental visits and eye exams.  Immunizations.  Screening for certain conditions.  Healthy lifestyle choices, such as eating a healthy diet, getting regular exercise, not using drugs or products that contain nicotine and tobacco, and limiting alcohol use. What can I expect for my preventive care visit? Physical exam Your health care provider will check your:  Height and weight. This may be used to calculate body mass index (BMI), which tells if you are at a healthy weight.  Heart rate and blood pressure.  Skin for abnormal spots. Counseling Your health care provider may ask you questions about your:  Alcohol, tobacco, and drug use.  Emotional well-being.  Home and relationship well-being.  Sexual activity.  Eating habits.  Work and work Statistician.  Method of birth control.  Menstrual cycle.  Pregnancy history. What immunizations do I need?  Influenza (flu) vaccine  This is recommended every year. Tetanus, diphtheria, and pertussis (Tdap) vaccine  You may need a Td booster every 10  years. Varicella (chickenpox) vaccine  You may need this if you have not been vaccinated. Zoster (shingles) vaccine  You may need this after age 50. Measles, mumps, and rubella (MMR) vaccine  You may need at least one dose of MMR if you were born in 1957 or later. You may also need a second dose. Pneumococcal conjugate (PCV13) vaccine  You may need this if you have certain conditions and were not previously vaccinated. Pneumococcal polysaccharide (PPSV23) vaccine  You may need one or two doses if you smoke cigarettes or if you have certain conditions. Meningococcal conjugate (MenACWY) vaccine  You may need this if you have certain conditions. Hepatitis A vaccine  You may need this if you have certain conditions or if you travel or work in places where you may be exposed to hepatitis A. Hepatitis B vaccine  You may need this if you have certain conditions or if you travel or work in places where you may be exposed to hepatitis B. Haemophilus influenzae type b (Hib) vaccine  You may need this if you have certain conditions. Human papillomavirus (HPV) vaccine  If recommended by your health care provider, you may need three doses over 6 months. You may receive vaccines as individual doses or as more than one vaccine together in one shot (combination vaccines). Talk with your health care provider about the risks and benefits of combination vaccines. What tests do I need? Blood tests  Lipid and cholesterol levels. These may be checked every 5 years, or more frequently if you are over 3 years old.  Hepatitis C test.  Hepatitis B test. Screening  Lung cancer screening. You may have this screening every year starting at age 50 if you have a 30-pack-year history of smoking and currently smoke or have quit within the past 15 years.  Colorectal cancer screening. All adults should have this screening starting at age 50 and continuing until age 20. Your health care provider may  recommend screening at age 26 if you are at increased risk. You will have tests every 1-10 years, depending on your results and the type of screening test.  Diabetes screening. This is done by checking your blood sugar (glucose) after you have not eaten for a while (fasting). You may have this done every 1-3 years.  Mammogram. This may be done every 1-2 years. Talk with your health care provider about when you should start having regular mammograms. This may depend on whether you have a family history of breast cancer.  BRCA-related cancer screening. This may be done if you have a family history of breast, ovarian, tubal, or peritoneal cancers.  Pelvic exam and Pap test. This may be done every 3 years starting at age 50. Starting at age 20, this may be done every 5 years if you have a Pap test in combination with an HPV test. Other tests  Sexually transmitted disease (STD) testing.  Bone density scan. This is done to screen for osteoporosis. You may have this scan if you are at high risk for osteoporosis. Follow these instructions at home: Eating and drinking  Eat a diet that includes fresh fruits and vegetables, whole grains, lean protein, and low-fat dairy.  Take vitamin and mineral supplements as recommended by your health care provider.  Do not drink alcohol if: ? Your health care provider tells you not to drink. ? You are pregnant, may be pregnant, or are planning to become pregnant.  If you drink alcohol: ? Limit how much you have to 0-1 drink a day. ? Be aware of how much alcohol is in your drink. In the [place] one drink equals one 12 oz bottle of beer (355 mL), one 5 oz glass of wine (148 mL), or one 1 oz glass of hard liquor (44 mL). Lifestyle  Take daily care of your teeth and gums.  Stay active. Exercise for at least 30 minutes on 5 or more days each week.  Do not use any products that contain nicotine or tobacco, such as cigarettes, e-cigarettes, and chewing tobacco. If  you need help quitting, ask your health care provider.  If you are sexually active, practice safe sex. Use a condom or other form of birth control (contraception) in order to prevent pregnancy and STIs (sexually transmitted infections).  If told by your health care provider, take low-dose aspirin daily starting at age 44. What's next?  Visit your health care provider once a year for a well check visit.  Ask your health care provider how often you should have your eyes and teeth checked.  Stay up to date on all vaccines. This information is not intended to replace advice given to you by your health care provider. Make sure you discuss any questions you have with your health care provider. Document Revised: 05/15/2018 Document Reviewed: 05/15/2018 Elsevier Patient Education  2020 [employer].

## 2020-08-09 ENCOUNTER — Encounter: Payer: Self-pay | Admitting: Family Medicine

## 2020-08-09 LAB — MICROALBUMIN, URINE: Microalb, Ur: 3.3 mg/dL

## 2020-08-10 ENCOUNTER — Other Ambulatory Visit: Payer: Self-pay

## 2020-08-10 DIAGNOSIS — E1165 Type 2 diabetes mellitus with hyperglycemia: Secondary | ICD-10-CM

## 2020-08-10 MED ORDER — SITAGLIPTIN PHOSPHATE 100 MG PO TABS: 100.0000 mg | ORAL_TABLET | Freq: Every day | ORAL | 5 refills | Status: DC

## 2020-08-10 NOTE — Telephone Encounter (Signed)
1st attempt left message for patient to return call to office in reference to message sent by Dr. Abner Greenspan.

## 2020-08-10 NOTE — Telephone Encounter (Signed)
Patient was informed of providers message and will contact the manufacturing company for [facility] for patient assistance and coupon information and sent rx as advised by provider to pharmacy per patient request.

## 2020-08-16 ENCOUNTER — Other Ambulatory Visit: Payer: Self-pay

## 2020-08-16 ENCOUNTER — Telehealth: Payer: Self-pay

## 2020-08-16 DIAGNOSIS — R197 Diarrhea, unspecified: Secondary | ICD-10-CM

## 2020-08-16 DIAGNOSIS — E1165 Type 2 diabetes mellitus with hyperglycemia: Secondary | ICD-10-CM

## 2020-08-16 MED ORDER — SITAGLIPTIN PHOSPHATE 100 MG PO TABS: 100.0000 mg | ORAL_TABLET | Freq: Every day | ORAL | 5 refills | Status: DC

## 2020-08-16 MED ORDER — DIPHENOXYLATE-ATROPINE 2.5-0.025 MG PO TABS: ORAL_TABLET | ORAL | 0 refills | Status: DC

## 2020-08-16 NOTE — Telephone Encounter (Signed)
Pt is aware of medication management. Pt wanted her medication for diarrhea sent with other medication. Pt states that she is in [place] and to resend them [facility] in [place], [place]. Done

## 2020-10-07 ENCOUNTER — Encounter: Payer: Self-pay | Admitting: Family Medicine

## 2020-10-07 ENCOUNTER — Other Ambulatory Visit: Payer: Self-pay | Admitting: Family Medicine

## 2020-10-07 MED ORDER — VITAMIN D (ERGOCALCIFEROL) 1.25 MG (50000 UNIT) PO CAPS: ORAL_CAPSULE | ORAL | 0 refills | Status: AC

## 2020-10-07 MED ORDER — GLIMEPIRIDE 1 MG PO TABS: 1.0000 mg | ORAL_TABLET | Freq: Two times a day (BID) | ORAL | 0 refills | Status: DC

## 2020-10-11 NOTE — Telephone Encounter (Signed)
Appt made for follow up and cpe will be made at that visit.

## 2020-10-20 ENCOUNTER — Encounter (INDEPENDENT_AMBULATORY_CARE_PROVIDER_SITE_OTHER): Payer: Self-pay

## 2021-01-02 ENCOUNTER — Ambulatory Visit (INDEPENDENT_AMBULATORY_CARE_PROVIDER_SITE_OTHER): Payer: 59 | Admitting: Family Medicine

## 2021-01-02 ENCOUNTER — Other Ambulatory Visit: Payer: Self-pay

## 2021-01-02 ENCOUNTER — Encounter: Payer: Self-pay | Admitting: Family Medicine

## 2021-01-02 VITALS — BP 128/80 | HR 83 | Temp 98.6°F | Resp 12 | Ht 65.0 in | Wt 242.0 lb

## 2021-01-02 DIAGNOSIS — E782 Mixed hyperlipidemia: Secondary | ICD-10-CM | POA: Diagnosis not present

## 2021-01-02 DIAGNOSIS — M25511 Pain in right shoulder: Secondary | ICD-10-CM

## 2021-01-02 DIAGNOSIS — E559 Vitamin D deficiency, unspecified: Secondary | ICD-10-CM

## 2021-01-02 DIAGNOSIS — R0981 Nasal congestion: Secondary | ICD-10-CM

## 2021-01-02 DIAGNOSIS — E669 Obesity, unspecified: Secondary | ICD-10-CM

## 2021-01-02 DIAGNOSIS — E1169 Type 2 diabetes mellitus with other specified complication: Secondary | ICD-10-CM

## 2021-01-02 DIAGNOSIS — R0982 Postnasal drip: Secondary | ICD-10-CM

## 2021-01-02 DIAGNOSIS — Z7289 Other problems related to lifestyle: Secondary | ICD-10-CM | POA: Diagnosis not present

## 2021-01-02 DIAGNOSIS — G4733 Obstructive sleep apnea (adult) (pediatric): Secondary | ICD-10-CM

## 2021-01-02 DIAGNOSIS — I10 Essential (primary) hypertension: Secondary | ICD-10-CM | POA: Diagnosis not present

## 2021-01-02 DIAGNOSIS — E1165 Type 2 diabetes mellitus with hyperglycemia: Secondary | ICD-10-CM

## 2021-01-02 DIAGNOSIS — M79671 Pain in right foot: Secondary | ICD-10-CM

## 2021-01-02 MED ORDER — ATORVASTATIN CALCIUM 20 MG PO TABS: 20.0000 mg | ORAL_TABLET | Freq: Every day | ORAL | 3 refills | Status: DC

## 2021-01-02 MED ORDER — METOPROLOL SUCCINATE ER 50 MG PO TB24: ORAL_TABLET | ORAL | 1 refills | Status: DC

## 2021-01-02 NOTE — Assessment & Plan Note (Signed)
Heal and arch pain. Encouraged icing and stretching and referred to sports med for further consideration

## 2021-01-02 NOTE — Assessment & Plan Note (Signed)
Encouraged heart healthy diet, increase exercise, avoid trans fats, consider a krill oil cap daily 

## 2021-01-02 NOTE — Assessment & Plan Note (Addendum)
Uses CPAP daily but has had her current equipment for years. Previously seen at [facility] clinic. She is referred to pulmonology for ongoing care and reevaluation.

## 2021-01-02 NOTE — Assessment & Plan Note (Signed)
Well controlled, no changes to meds. Encouraged heart healthy diet such as the DASH diet and exercise as tolerated.  °

## 2021-01-02 NOTE — Assessment & Plan Note (Signed)
Persistent with PND and discomfort is referred to ENT for evaluation

## 2021-01-02 NOTE — Patient Instructions (Signed)

## 2021-01-02 NOTE — Assessment & Plan Note (Signed)
Has been bothering her for about 2 months. No fall or trauma. Referred to sports medicine for further evaluation. Can try topical treatments for now.

## 2021-01-02 NOTE — Assessment & Plan Note (Signed)
,   minimize simple carbs. Increase exercise as tolerated. Continue current meds  

## 2021-01-02 NOTE — Progress Notes (Signed)
Patient ID: Chelsea Bray, female    DOB: 1970/09/27  Age: 50 y.o. MRN: [id number]    Subjective:  Subjective  HPI Chelsea Bray presents for office visit today for medication management and c/o pain in right arm and right foot in the arch and heel. She expresses interest in taking insulin shots over taking pills to manage her diabetes. She denies any chest pain, SOB, fever, abdominal pain, cough, chills, sore throat, dysuria, urinary incontinence, back pain, HA, or N/VD. She states that her uncle has passed away 2 weeks ago at the age of 51 y/o. She endorses her highest glucose was 220 and her lowest was 122, but she states that she rarely checks it due to fear of it being high.   She reports that the pain in the arch and heel of right foot has started 2 weeks ago and the pain in her right arm has started 2 months ago. She states that putting pressure on her left foot causes her pain. She states that lifting her right arm to do activities like reaching out to her phone while driving causes her more pain. She states that she experiences a numbness sensation in her right hand. She states that she feels weakness in her right hand that leads to pain. She reports neck pain localized to the left.  She states that she has a chronic congestion problem since childhood and endorses having sleep apnea, but she uses CPAP daily.   Review of Systems  Constitutional: Negative for chills, fatigue and fever.  HENT: Positive for congestion (chronic), sinus pressure and sinus pain. Negative for rhinorrhea and sore throat.   Eyes: Negative for pain.  Respiratory: Negative for cough and shortness of breath.   Cardiovascular: Negative for chest pain, palpitations and leg swelling.  Gastrointestinal: Negative for abdominal pain, blood in stool, diarrhea, nausea and vomiting.  Genitourinary: Negative for decreased urine volume, flank pain, frequency, vaginal bleeding and vaginal discharge.  Musculoskeletal:  Positive for neck pain. Negative for back pain.       (+) right foot pain local to arch and heel (+) right arm pain and weakness  Neurological: Negative for headaches.    History Past Medical History:  Diagnosis Date  . Acid reflux disease   . Arthritis   . Colon polyps 02/21/2017  . Diabetes (HCC) 11/17/2011  . Diabetes mellitus type 2 in obese (HCC) 11/17/2011  . History of esophageal stricture 02/21/2017  . Hyperlipidemia   . Hypertension   . IBS (irritable bowel syndrome)   . Insomnia 03/08/2016  . Lactose intolerance   . Morbid obesity (HCC) 01/10/2013  . Pain in joint, upper arm 11/08/2015  . Preventative health care 11/29/2013  . Sleep apnea    c pap  . Swelling of both lower extremities   . TOS (thoracic outlet syndrome) 11/20/2015  . Vaginitis and vulvovaginitis 12/23/2014  . Vitamin D deficiency     She has a past surgical history that includes Gallbladder surgery (1991); Cerebral microvascular decompression (2006); Breast reduction surgery (2001); Cholecystectomy; Mole removal; Reduction mammaplasty (Bilateral, 2001); Colonoscopy; and Polypectomy.   Her family history includes Alcohol abuse in her paternal grandmother; Alcoholism in her father; Arrhythmia in her mother; Asthma in her daughter; Cancer in her father and maternal grandfather; Colon cancer in her maternal grandfather; Colon polyps in her mother; Diabetes in her maternal grandmother and mother; Heart attack in her father; Heart disease in her father, maternal grandmother, and paternal grandfather; Hyperlipidemia in her father; Hypertension  in her father, mother, and another family member; Prostate cancer in her maternal grandfather; Stroke in her father and maternal grandmother.She reports that she has never smoked. She has never used smokeless tobacco. She reports current alcohol use. She reports that she does not use drugs.  Current Outpatient Medications on File Prior to Visit  Medication Sig Dispense Refill  . Ascorbic  Acid (VITAMIN C PO) Take by mouth daily.    Marland Kitchen azelastine (OPTIVAR) 0.05 % ophthalmic solution Place 1 drop into both eyes 2 (two) times daily. 6 mL 1  . BENFOTIAMINE PO Take 300 mg by mouth daily.    Marland Kitchen BIOTIN PO Take by mouth.    . diphenoxylate-atropine (LOMOTIL) 2.5-0.025 MG tablet TAKE 1 TABLET BY MOUTH 4 TIMES DAILY AS NEEDED FOR DIARRHEA OR  LOOSE  STOOLS 15 tablet 0  . ELDERBERRY PO Take 1 tablet by mouth daily.    Marland Kitchen glimepiride (AMARYL) 1 MG tablet Take 1 tablet (1 mg total) by mouth in the morning and at bedtime. 180 tablet 0  . hydrochlorothiazide (HYDRODIURIL) 25 MG tablet Take 1 tablet (25 mg total) by mouth daily. 90 tablet 1  . hyoscyamine (LEVSIN SL) 0.125 MG SL tablet Place 1 tablet (0.125 mg total) under the tongue every 4 (four) hours as needed. 30 tablet 1  . Lactobacillus Rhamnosus, GG, (CULTURELLE PO) Take by mouth.    . levonorgestrel (MIRENA) 20 MCG/24HR IUD 1 each by Intrauterine route once.    . metFORMIN (GLUCOPHAGE XR) 500 MG 24 hr tablet Take 1 tablet (500 mg total) by mouth daily with breakfast. 90 tablet 0  . Multiple Vitamins-Minerals (MULTIVITAMIN ADULTS) TABS Take by mouth daily.    . naproxen (NAPROSYN) 375 MG tablet Take 1 tablet (375 mg total) by mouth 2 (two) times daily with a meal. 60 tablet 1  . sitaGLIPtin (JANUVIA) 100 MG tablet Take 1 tablet (100 mg total) by mouth daily. 30 tablet 5  . UNABLE TO FIND Take 2 tablets by mouth daily. Med Name: [place]    . Vitamin D, Ergocalciferol, (DRISDOL) 1.25 MG (50000 UNIT) CAPS capsule TAKE 1 CAPSULE BY MOUTH ONCE A WEEK 12 capsule 0  . zinc gluconate 50 MG tablet Take 50 mg by mouth daily.     No current facility-administered medications on file prior to visit.     Objective:  Objective  Physical Exam Constitutional:      General: She is not in acute distress.    Appearance: Normal appearance. She is not ill-appearing or toxic-appearing.  HENT:     Head: Normocephalic and atraumatic.     Right Ear:  Tympanic membrane, ear canal and external ear normal.     Left Ear: Tympanic membrane, ear canal and external ear normal.     Nose: No congestion or rhinorrhea.  Eyes:     Extraocular Movements: Extraocular movements intact.     Pupils: Pupils are equal, round, and reactive to light.  Cardiovascular:     Rate and Rhythm: Normal rate and regular rhythm.     Pulses: Normal pulses.     Heart sounds: Normal heart sounds. No murmur heard.   Pulmonary:     Effort: Pulmonary effort is normal. No respiratory distress.     Breath sounds: Normal breath sounds. No wheezing, rhonchi or rales.  Abdominal:     General: Bowel sounds are normal.     Palpations: Abdomen is soft. There is no mass.     Tenderness: There is no  abdominal tenderness. There is no guarding.     Hernia: No hernia is present.  Musculoskeletal:        General: Normal range of motion.     Cervical back: Normal range of motion and neck supple.  Skin:    General: Skin is warm and dry.  Neurological:     Mental Status: She is alert and oriented to person, place, and time.  Psychiatric:        Behavior: Behavior normal.    BP 128/80 (BP Location: Left Arm, Cuff Size: Large)   Pulse 83   Temp 98.6 F (37 C) (Oral)   Resp 12   Ht 5\' 5"  (1.651 m)   Wt 242 lb (109.8 kg)   SpO2 96%   BMI 40.27 kg/m  Wt Readings from Last 3 Encounters:  01/02/21 242 lb (109.8 kg)  08/08/20 242 lb (109.8 kg)  04/18/20 (!) 239 lb 3.2 oz (108.5 kg)     Lab Results  Component Value Date   WBC 8.2 08/08/2020   HGB 13.6 08/08/2020   HCT 40.8 08/08/2020   PLT 303.0 08/08/2020   GLUCOSE 197 (H) 08/08/2020   CHOL 157 08/08/2020   TRIG 98.0 08/08/2020   HDL 39.60 08/08/2020   LDLDIRECT 149.0 02/21/2017   LDLCALC 98 08/08/2020   ALT 18 08/08/2020   AST 15 08/08/2020   NA 138 08/08/2020   K 4.3 08/08/2020   CL 101 08/08/2020   CREATININE 1.00 08/08/2020   BUN 9 08/08/2020   CO2 29 08/08/2020   TSH 0.86 10/05/2019   HGBA1C 9.2 (H)  08/08/2020   MICROALBUR 3.3 08/08/2020    DG Chest 2 View  Result Date: 04/18/2020 CLINICAL DATA:  Pedal edema EXAM: CHEST - 2 VIEW COMPARISON:  January eleventh, 2015 FINDINGS: The cardiomediastinal silhouette is unchanged in contour. No pleural effusion. No pneumothorax. No acute pleuroparenchymal abnormality. Surgical clips project over the RIGHT upper quadrant. Multilevel degenerative changes of the thoracic spine. IMPRESSION: No acute cardiopulmonary abnormality. Electronically Signed   By: Meda KlinefelterStephanie  Peacock MD   On: 04/18/2020 11:14     Assessment & Plan:  Plan    Meds ordered this encounter  Medications  . metoprolol succinate (TOPROL-XL) 50 MG 24 hr tablet    Sig: TAKE 1 TABLET BY MOUTH ONCE DAILY WITH FOOD OR  IMMEDIATELY  FOLLOWING  A  MEAL    Dispense:  90 tablet    Refill:  1  . atorvastatin (LIPITOR) 20 MG tablet    Sig: Take 1 tablet (20 mg total) by mouth daily.    Dispense:  90 tablet    Refill:  3    Problem List Items Addressed This Visit    Hyperlipidemia, mixed    Encouraged heart healthy diet, increase exercise, avoid trans fats, consider a krill oil cap daily      Relevant Medications   metoprolol succinate (TOPROL-XL) 50 MG 24 hr tablet   atorvastatin (LIPITOR) 20 MG tablet   Other Relevant Orders   Lipid panel   OSA (obstructive sleep apnea)    Uses CPAP daily but has had her current equipment for years. Previously seen at [facility]. She is referred to pulmonology for ongoing care and reevaluation.       Relevant Orders   Ambulatory referral to Pulmonology   Diabetes mellitus type 2 in obese (HCC)    hgba1c acceptable, minimize simple carbs. Increase exercise as tolerated. Continue current meds      Relevant Medications  atorvastatin (LIPITOR) 20 MG tablet   Other Relevant Orders   Hemoglobin A1c   Microalbumin / creatinine urine ratio   Essential hypertension    Well controlled, no changes to meds. Encouraged heart healthy diet such as  the DASH diet and exercise as tolerated.       Relevant Medications   metoprolol succinate (TOPROL-XL) 50 MG 24 hr tablet   atorvastatin (LIPITOR) 20 MG tablet   Other Relevant Orders   Comprehensive metabolic panel   CBC   TSH   Vitamin D deficiency    Supplement and monitor      Relevant Orders   VITAMIN D 25 Hydroxy (Vit-D Deficiency, Fractures)   Type 2 diabetes mellitus with hyperglycemia, without long-term current use of insulin (HCC)    , minimize simple carbs. Increase exercise as tolerated. Continue current meds      Relevant Medications   atorvastatin (LIPITOR) 20 MG tablet   Right foot pain    Heal and arch pain. Encouraged icing and stretching and referred to sports med for further consideration      Relevant Orders   Ambulatory referral to Sports Medicine   Nasal congestion    Persistent with PND and discomfort is referred to ENT for evaluation       Relevant Orders   Ambulatory referral to ENT   Right shoulder pain    Has been bothering her for about 2 months. No fall or trauma. Referred to sports medicine for further evaluation. Can try topical treatments for now.       Relevant Orders   Ambulatory referral to Sports Medicine    Other Visit Diagnoses    Other problems related to lifestyle    -  Primary   Relevant Orders   Hepatitis C antibody   PND (post-nasal drip)       Relevant Orders   Ambulatory referral to ENT      Follow-up: Return in about 3 months (around 04/03/2021), or 3 mn f/u 6 mn CPE.   I,David Hanna,acting as a scribe for Danise Edge, MD.,have documented all relevant documentation on the behalf of Danise Edge, MD,as directed by  Danise Edge, MD while in the presence of Danise Edge, MD.  I, [name], MD personally performed the services described in this documentation. All medical record entries made by the scribe were at my direction and in my presence. I have reviewed the chart and agree that the record reflects my  personal performance and is accurate and complete

## 2021-01-02 NOTE — Assessment & Plan Note (Signed)
Supplement and monitor 

## 2021-01-02 NOTE — Assessment & Plan Note (Signed)
hgba1c acceptable, minimize simple carbs. Increase exercise as tolerated. Continue current meds 

## 2021-01-03 LAB — LIPID PANEL
Cholesterol: 162 mg/dL (ref 0–200)
HDL: 32.8 mg/dL — ABNORMAL LOW (ref >39.00)
NonHDL: 129.58
Total CHOL/HDL Ratio: 5
Triglycerides: 234 mg/dL — ABNORMAL HIGH (ref 0.0–149.0)
VLDL: 46.8 mg/dL — ABNORMAL HIGH (ref 0.0–40.0)

## 2021-01-03 LAB — COMPREHENSIVE METABOLIC PANEL
ALT: 20 U/L (ref 0–35)
AST: 19 U/L (ref 0–37)
Albumin: 3.8 g/dL (ref 3.5–5.2)
Alkaline Phosphatase: 55 U/L (ref 39–117)
BUN: 12 mg/dL (ref 6–23)
CO2: 28 mEq/L (ref 19–32)
Calcium: 9 mg/dL (ref 8.4–10.5)
Chloride: 102 mEq/L (ref 96–112)
Creatinine, Ser: 0.83 mg/dL (ref 0.40–1.20)
GFR: 82.12 mL/min (ref >60.00)
Glucose, Bld: 163 mg/dL — ABNORMAL HIGH (ref 70–99)
Potassium: 3.9 mEq/L (ref 3.5–5.1)
Sodium: 137 mEq/L (ref 135–145)
Total Bilirubin: 0.3 mg/dL (ref 0.2–1.2)
Total Protein: 6.8 g/dL (ref 6.0–8.3)

## 2021-01-03 LAB — CBC
HCT: 39.5 % (ref 36.0–46.0)
Hemoglobin: 13.4 g/dL (ref 12.0–15.0)
MCHC: 33.9 g/dL (ref 30.0–36.0)
MCV: 93.6 fl (ref 78.0–100.0)
Platelets: 327 10*3/uL (ref 150.0–400.0)
RBC: 4.22 Mil/uL (ref 3.87–5.11)
RDW: 13.8 % (ref 11.5–15.5)
WBC: 6.7 10*3/uL (ref 4.0–10.5)

## 2021-01-03 LAB — HEMOGLOBIN A1C: Hgb A1c MFr Bld: 9.9 % — ABNORMAL HIGH (ref 4.6–6.5)

## 2021-01-03 LAB — MICROALBUMIN / CREATININE URINE RATIO
Creatinine,U: 125 mg/dL
Microalb Creat Ratio: 3 mg/g (ref 0.0–30.0)
Microalb, Ur: 3.7 mg/dL — ABNORMAL HIGH (ref 0.0–1.9)

## 2021-01-03 LAB — TSH: TSH: 0.9 u[IU]/mL (ref 0.35–4.50)

## 2021-01-03 LAB — VITAMIN D 25 HYDROXY (VIT D DEFICIENCY, FRACTURES): VITD: 73.81 ng/mL (ref 30.00–100.00)

## 2021-01-03 LAB — HEPATITIS C ANTIBODY
Hepatitis C Ab: NONREACTIVE
SIGNAL TO CUT-OFF: 0.29 (ref <1.00)

## 2021-01-03 LAB — LDL CHOLESTEROL, DIRECT: Direct LDL: 122 mg/dL

## 2021-01-05 NOTE — Addendum Note (Signed)
Addended by: Lisbeth Renshaw, Allahna Husband HUA on: 01/05/2021 04:01 PM   Modules accepted: Orders

## 2021-01-11 ENCOUNTER — Other Ambulatory Visit: Payer: Self-pay | Admitting: Family Medicine

## 2021-01-11 ENCOUNTER — Other Ambulatory Visit: Payer: Self-pay

## 2021-01-11 ENCOUNTER — Telehealth: Payer: Self-pay | Admitting: Family Medicine

## 2021-01-11 DIAGNOSIS — R197 Diarrhea, unspecified: Secondary | ICD-10-CM

## 2021-01-11 DIAGNOSIS — E1169 Type 2 diabetes mellitus with other specified complication: Secondary | ICD-10-CM

## 2021-01-11 MED ORDER — GLIMEPIRIDE 2 MG PO TABS: 2.0000 mg | ORAL_TABLET | Freq: Two times a day (BID) | ORAL | 3 refills | Status: DC

## 2021-01-11 MED ORDER — DIPHENOXYLATE-ATROPINE 2.5-0.025 MG PO TABS: ORAL_TABLET | ORAL | 0 refills | Status: AC

## 2021-01-11 NOTE — Telephone Encounter (Signed)
Please advise on refill. She has not gotten it since 10/05/19 and her last visit was 01/02/21.

## 2021-01-11 NOTE — Telephone Encounter (Signed)
Medication sent in. 

## 2021-01-11 NOTE — Telephone Encounter (Signed)
Patient is calling in reference to medication change , patient states pharmacy  has not received new medication    note from lab results   Please discontinue her Glimeperide 1 mg tabs and send in 2 mg tabs, sig: Glimiperide 2 mg tabs, 1 tab po twice a day, disp #60 with 3 refills  Please advise

## 2021-01-12 NOTE — Progress Notes (Signed)
Subjective:    I'm seeing this patient as a consultation for:  Dr. Abner Greenspan. Note will be routed back to referring provider/PCP.  CC: R arm and R heel/arch pain  I, Molly Weber, LAT, ATC, am serving as scribe for Dr. Clementeen Graham.  HPI: Pt is a 50 y/o female presenting w/ R heel/arch and R arm pain. Pt works as a Naval architect.  R arm pain: Pain x 2 months w/ no known MOI.  Pt reports location of pain varies; R-side neck, thumb, R upper arm. -Radiating pain: yes -Neck pain: yes -R UE numbness/tingling: yes in R hand -Aggravating factors: reaching activities w/ her R arm, at night -Treatments tried: propping arm up w/ a pillow, Tylenol, naproxen, Voltaren  R heel/arch pain: Pain x 2 weeks w/ no known MOI. Pt notes pain has lessened over the last few days. She specifically locates her pain to R arch and into R heel along the plantar aspect. -Swelling: no -Aggravating factors: pressure to plantar surface of her foot, walking, standing -Treatments tried: arch supports (Dr. Idelle Jo)  Diagnostic imaging: R foot XR- 12/03/19  Past medical history, Surgical history, Family history, Social history, Allergies, and medications have been entered into the medical record, reviewed.   Review of Systems: No new headache, visual changes, nausea, vomiting, diarrhea, constipation, dizziness, abdominal pain, skin rash, fevers, chills, night sweats, weight loss, swollen lymph nodes, body aches, joint swelling, muscle aches, chest pain, shortness of breath, mood changes, visual or auditory hallucinations.   Objective:    Vitals:   01/13/21 0835  BP: (!) 162/113  Pulse: 64  SpO2: 99%   General: Well Developed, well nourished, and in no acute distress.  Neuro/Psych: Alert and oriented x3, extra-ocular muscles intact, able to move all 4 extremities, sensation grossly intact. Skin: Warm and dry, no rashes noted.  Respiratory: Not using accessory muscles, speaking in full sentences, trachea midline.   Cardiovascular: Pulses palpable, no extremity edema. Abdomen: Does not appear distended. MSK: C-spine normal-appearing Nontender midline. Decreased cervical motion. Negative Spurling's test. Upper extremity strength is intact. Reflexes are intact.  Right shoulder normal-appearing Tender palpation right trapezius. Normal shoulder motion.  Negative impingement testing.  Intact strength.  Right foot normal-appearing nontender normal motion.   Lab and Radiology Results X-ray images C-spine obtained today personally and independently interpreted. DDD with anterior spurring C5-6. Abnormal appearance C1 with possible fusion right facet articulation No acute fractures visible. Await formal radiology review   Impression and Recommendations:    Assessment and Plan: 50 y.o. female with right arm pain most consistent with right C6 radiculopathy which is consistent with the degenerative changes seen on cervical spine x-ray today.  Plan for physical therapy.  Additionally using gabapentin and prednisone.  Patient also has some trapezius spasm and dysfunction which would improve with PT as well..  Patient also has right plantar calcaneus pain which is most consistent with plantar fasciitis.  Discussed treatment plan and options for that.  Practical pragmatic solutions including heel pads and icing and ATC reviewed home exercise program.  Recheck in 6 weeks.  PDMP not reviewed this encounter. Orders Placed This Encounter  Procedures  . DG Cervical Spine 2 or 3 views    Standing Status:   Future    Number of Occurrences:   1    Standing Expiration Date:   01/13/2022    Order Specific Question:   Reason for Exam (SYMPTOM  OR DIAGNOSIS REQUIRED)    Answer:   eval rt c6  rad    Order Specific Question:   Is patient pregnant?    Answer:   No    Order Specific Question:   Preferred imaging location?    Answer:   [facility]  . Ambulatory referral to Physical Therapy    Referral  Priority:   Routine    Referral Type:   Physical Medicine    Referral Reason:   Specialty Services Required    Requested Specialty:   Physical Therapy   Meds ordered this encounter  Medications  . gabapentin (NEURONTIN) 300 MG capsule    Sig: Take 1 capsule (300 mg total) by mouth 3 (three) times daily as needed.    Dispense:  90 capsule    Refill:  3  . predniSONE (DELTASONE) 50 MG tablet    Sig: Take 1 tablet (50 mg total) by mouth daily.    Dispense:  5 tablet    Refill:  0    Discussed warning signs or symptoms. Please see discharge instructions. Patient expresses understanding.   The above documentation has been reviewed and is accurate and complete Clementeen Graham, M.D.

## 2021-01-13 ENCOUNTER — Ambulatory Visit (INDEPENDENT_AMBULATORY_CARE_PROVIDER_SITE_OTHER): Payer: 59

## 2021-01-13 ENCOUNTER — Other Ambulatory Visit: Payer: Self-pay

## 2021-01-13 ENCOUNTER — Encounter: Payer: Self-pay | Admitting: Family Medicine

## 2021-01-13 ENCOUNTER — Ambulatory Visit (INDEPENDENT_AMBULATORY_CARE_PROVIDER_SITE_OTHER): Payer: 59 | Admitting: Family Medicine

## 2021-01-13 VITALS — BP 162/113 | HR 64 | Ht 65.0 in | Wt 242.8 lb

## 2021-01-13 DIAGNOSIS — M5412 Radiculopathy, cervical region: Secondary | ICD-10-CM | POA: Diagnosis not present

## 2021-01-13 DIAGNOSIS — M62838 Other muscle spasm: Secondary | ICD-10-CM | POA: Diagnosis not present

## 2021-01-13 DIAGNOSIS — M722 Plantar fascial fibromatosis: Secondary | ICD-10-CM | POA: Diagnosis not present

## 2021-01-13 MED ORDER — PREDNISONE 50 MG PO TABS
50.0000 mg | ORAL_TABLET | Freq: Every day | ORAL | 0 refills | Status: DC
Start: 2021-01-13 — End: 2021-01-24

## 2021-01-13 MED ORDER — GABAPENTIN 300 MG PO CAPS: 300.0000 mg | ORAL_CAPSULE | Freq: Three times a day (TID) | ORAL | 3 refills | Status: DC | PRN

## 2021-01-13 NOTE — Patient Instructions (Addendum)
Thank you for coming in today.  Please complete the exercises that the athletic trainer went over with you: View at [email or web address] using code: [id number]   Please get an Xray today before you leave.  I've referred you to Physical Therapy.  Let us know if you don't hear from them in one week.  Take the prednisone if needed.   Gel heel cup   Ice massage.   Recheck in 6 weeks.   TENS UNIT: This is helpful for muscle pain and spasm.   Search and Purchase a TENS 7000 2nd edition at  www.tenspros.com or www.Amazon.com It should be less than $30.     TENS unit instructions: Do not shower or bathe with the unit on . Turn the unit off before removing electrodes or batteries . If the electrodes lose stickiness add a drop of water to the electrodes after they are disconnected from the unit and place on plastic sheet. If you continued to have difficulty, call the TENS unit company to purchase more electrodes. . Do not apply lotion on the skin area prior to use. Make sure the skin is clean and dry as this will help prolong the life of the electrodes. . After use, always check skin for unusual red areas, rash or other skin difficulties. If there are any skin problems, does not apply electrodes to the same area. . Never remove the electrodes from the unit by pulling the wires. . Do not use the TENS unit or electrodes other than as directed. . Do not change electrode placement without consultating your therapist or physician. Marland Kitchen Keep 2 fingers with between each electrode. . Wear time ratio is 2:1, on to off times.    For example on for 30 minutes off for 15 minutes and then on for 30 minutes off for 15 minutes

## 2021-01-16 NOTE — Progress Notes (Signed)
X-ray cervical spine shows some arthritis changes at C5-C6

## 2021-01-24 ENCOUNTER — Ambulatory Visit: Payer: 59 | Admitting: Internal Medicine

## 2021-01-24 ENCOUNTER — Ambulatory Visit: Payer: 59 | Attending: Family Medicine

## 2021-01-24 ENCOUNTER — Ambulatory Visit (HOSPITAL_BASED_OUTPATIENT_CLINIC_OR_DEPARTMENT_OTHER)
Admission: RE | Admit: 2021-01-24 | Discharge: 2021-01-24 | Disposition: A | Discharge status: Home / Self Care | Payer: 59 | Source: Ambulatory Visit | Attending: Medical | Admitting: Medical

## 2021-01-24 ENCOUNTER — Other Ambulatory Visit: Payer: Self-pay

## 2021-01-24 ENCOUNTER — Encounter (HOSPITAL_BASED_OUTPATIENT_CLINIC_OR_DEPARTMENT_OTHER): Payer: Self-pay

## 2021-01-24 ENCOUNTER — Encounter: Payer: Self-pay | Admitting: Internal Medicine

## 2021-01-24 VITALS — BP 134/84 | HR 79 | Ht 65.0 in | Wt 241.0 lb

## 2021-01-24 DIAGNOSIS — E1165 Type 2 diabetes mellitus with hyperglycemia: Secondary | ICD-10-CM

## 2021-01-24 DIAGNOSIS — M542 Cervicalgia: Secondary | ICD-10-CM | POA: Diagnosis present

## 2021-01-24 DIAGNOSIS — E669 Obesity, unspecified: Secondary | ICD-10-CM | POA: Diagnosis not present

## 2021-01-24 DIAGNOSIS — E785 Hyperlipidemia, unspecified: Secondary | ICD-10-CM | POA: Diagnosis not present

## 2021-01-24 DIAGNOSIS — M6281 Muscle weakness (generalized): Secondary | ICD-10-CM | POA: Diagnosis present

## 2021-01-24 DIAGNOSIS — E1169 Type 2 diabetes mellitus with other specified complication: Secondary | ICD-10-CM

## 2021-01-24 DIAGNOSIS — Z1231 Encounter for screening mammogram for malignant neoplasm of breast: Secondary | ICD-10-CM | POA: Insufficient documentation

## 2021-01-24 DIAGNOSIS — R293 Abnormal posture: Secondary | ICD-10-CM

## 2021-01-24 DIAGNOSIS — M79601 Pain in right arm: Secondary | ICD-10-CM | POA: Diagnosis present

## 2021-01-24 LAB — POCT GLUCOSE (DEVICE FOR HOME USE): POC Glucose: 161 mg/dl — AB (ref 70–99)

## 2021-01-24 MED ORDER — GLIPIZIDE 5 MG PO TABS: 5.0000 mg | ORAL_TABLET | Freq: Two times a day (BID) | ORAL | 1 refills | Status: DC

## 2021-01-24 MED ORDER — DAPAGLIFLOZIN PROPANEDIOL 5 MG PO TABS: 5.0000 mg | ORAL_TABLET | Freq: Every day | ORAL | 6 refills | Status: DC

## 2021-01-24 NOTE — Progress Notes (Signed)
Name: Chelsea Bray  MRN/ DOB: [id number], 1971-05-01   Age/ Sex: 50 y.o., female    PCP: [name], MD   Reason for Endocrinology Evaluation: Type 2 Diabetes Mellitus     Date of Initial Endocrinology Visit: 01/24/2021     PATIENT IDENTIFIER: [name] is a 50 y.o. female with a past medical history of T2Dm, HTN and dyslipidemia . The patient presented for initial endocrinology clinic visit on 01/24/2021 for consultative assistance with her diabetes management.    HPI: Chelsea Bray was    Diagnosed with DM since 2014 Prior Medications tried/Intolerance: higher doses of metformin  Currently checking blood sugars occasionally Hypoglycemia episodes : no      Hemoglobin A1c has ranged from 6.5% in 2013, peaking at 9.9% in 2022. Patient required assistance for hypoglycemia: no Patient has required hospitalization within the last 1 year from hyper or hypoglycemia: no  In terms of diet, the patient eats 2-3x a day, snacks x1 day . Avoids sugar sweetened beverages   Intolerant to higher doses of metformin   She is on Gabapentin for neck pain   HOME DIABETES REGIMEN: Glimepiride 2 mg BID  Metformin 500 mg XR 1 tablet daily  Januvia 100 mg daily - has not taken since 12/2020 due to cost      Statin: yes ACE-I/ARB: no Prior Diabetic Education: yes    DIABETIC COMPLICATIONS: Microvascular complications:    Denies: CKD, retinopathy, neuropathy   Last eye exam: Completed 2020  Macrovascular complications:    Denies: CAD, PVD, CVA   PAST HISTORY: Past Medical History:  Past Medical History:  Diagnosis Date  . Acid reflux disease   . Arthritis   . Colon polyps 02/21/2017  . Diabetes (HCC) 11/17/2011  . Diabetes mellitus type 2 in obese (HCC) 11/17/2011  . History of esophageal stricture 02/21/2017  . Hyperlipidemia   . Hypertension   . IBS (irritable bowel syndrome)   . Insomnia 03/08/2016  . Lactose intolerance   . Morbid obesity (HCC) 01/10/2013   . Pain in joint, upper arm 11/08/2015  . Preventative health care 11/29/2013  . Sleep apnea    c pap  . Swelling of both lower extremities   . TOS (thoracic outlet syndrome) 11/20/2015  . Vaginitis and vulvovaginitis 12/23/2014  . Vitamin D deficiency    Past Surgical History:  Past Surgical History:  Procedure Laterality Date  . BREAST REDUCTION SURGERY  2001  . CEREBRAL MICROVASCULAR DECOMPRESSION  2006   back of her head  . CHOLECYSTECTOMY    . COLONOSCOPY    . GALLBLADDER SURGERY  1991   gall stone removed  . MOLE REMOVAL    . POLYPECTOMY    . REDUCTION MAMMAPLASTY Bilateral 2001      Social History:  reports that she has never smoked. She has never used smokeless tobacco. She reports current alcohol use. She reports that she does not use drugs. Family History:  Family History  Problem Relation Age of Onset  . Heart disease Maternal Grandmother        MI  . Stroke Maternal Grandmother   . Diabetes Maternal Grandmother   . Arrhythmia Mother   . Diabetes Mother        maternal grandmother and aunts  . Hypertension Mother   . Colon polyps Mother   . Heart attack Father   . Stroke Father   . Hyperlipidemia Father   . Hypertension Father   . Cancer Father  lung, smoker  . Heart disease Father        CABG x 4  . Alcoholism Father   . Prostate cancer Maternal Grandfather   . Cancer Maternal Grandfather        colon cancer, prostate  . Colon cancer Maternal Grandfather   . Alcohol abuse Paternal Grandmother        died of pneumonia  . Heart disease Paternal Grandfather        MI  . Asthma Daughter        allergies  . Hypertension Other        parents and maternal parents  . Breast cancer Neg Hx   . Esophageal cancer Neg Hx   . Rectal cancer Neg Hx   . Stomach cancer Neg Hx      HOME MEDICATIONS: Allergies as of 01/24/2021   No Known Allergies     Medication List       Accurate as of Jan 24, 2021  8:20 AM. If you have any questions, ask your nurse  or doctor.        STOP taking these medications   glimepiride 2 MG tablet Commonly known as: AMARYL Stopped by: Scarlette ShortsIbtehal J Yvonne Petite, MD   predniSONE 50 MG tablet Commonly known as: DELTASONE Stopped by: Scarlette ShortsIbtehal J Hinton Luellen, MD   sitaGLIPtin 100 MG tablet Commonly known as: Januvia Stopped by: Scarlette ShortsIbtehal J Akeema Broder, MD     TAKE these medications   atorvastatin 20 MG tablet Commonly known as: LIPITOR Take 1 tablet (20 mg total) by mouth daily.   azelastine 0.05 % ophthalmic solution Commonly known as: OPTIVAR Place 1 drop into both eyes 2 (two) times daily.   BENFOTIAMINE PO Take 300 mg by mouth daily.   BIOTIN PO Take by mouth.   CULTURELLE PO Take by mouth.   dapagliflozin propanediol 5 MG Tabs tablet Commonly known as: Farxiga Take 1 tablet (5 mg total) by mouth daily before breakfast. Started by: Scarlette ShortsIbtehal J Jean Alejos, MD   diphenoxylate-atropine 2.5-0.025 MG tablet Commonly known as: LOMOTIL TAKE 1 TABLET BY MOUTH 4 TIMES DAILY AS NEEDED FOR DIARRHEA OR  LOOSE  STOOLS   ELDERBERRY PO Take 1 tablet by mouth daily.   gabapentin 300 MG capsule Commonly known as: NEURONTIN Take 1 capsule (300 mg total) by mouth 3 (three) times daily as needed.   glipiZIDE 5 MG tablet Commonly known as: GLUCOTROL Take 1 tablet (5 mg total) by mouth 2 (two) times daily before a meal. Started by: Scarlette ShortsIbtehal J Adelis Docter, MD   hydrochlorothiazide 25 MG tablet Commonly known as: HYDRODIURIL Take 1 tablet (25 mg total) by mouth daily.   hyoscyamine 0.125 MG SL tablet Commonly known as: LEVSIN SL Place 1 tablet (0.125 mg total) under the tongue every 4 (four) hours as needed.   levonorgestrel 20 MCG/24HR IUD Commonly known as: MIRENA 1 each by Intrauterine route once.   metFORMIN 500 MG 24 hr tablet Commonly known as: Glucophage XR Take 1 tablet (500 mg total) by mouth daily with breakfast.   metoprolol succinate 50 MG 24 hr tablet Commonly known as: TOPROL-XL TAKE 1  TABLET BY MOUTH ONCE DAILY WITH FOOD OR  IMMEDIATELY  FOLLOWING  A  MEAL   Multivitamin Adults Tabs Take by mouth daily.   naproxen 375 MG tablet Commonly known as: NAPROSYN Take 1 tablet (375 mg total) by mouth 2 (two) times daily with a meal.   UNABLE TO FIND Take 2 tablets by mouth daily. Med Name: [place]  [place]   VITAMIN C PO Take by mouth daily.   Vitamin D (Ergocalciferol) 1.25 MG (50000 UNIT) Caps capsule Commonly known as: DRISDOL TAKE 1 CAPSULE BY MOUTH ONCE A WEEK   zinc gluconate 50 MG tablet Take 50 mg by mouth daily.        ALLERGIES: No Known Allergies   REVIEW OF SYSTEMS: A comprehensive ROS was conducted with the patient and is negative except as per HPI and below:  Review of Systems  Gastrointestinal: Negative for nausea and vomiting.  Genitourinary: Negative for frequency.  Endo/Heme/Allergies: Negative for polydipsia.      OBJECTIVE:   VITAL SIGNS: BP 134/84   Pulse 79   Ht 5\' 5"  (1.651 m)   Wt 241 lb (109.3 kg)   SpO2 98%   BMI 40.10 kg/m    PHYSICAL EXAM:  General: Pt appears well and is in NAD  HEENT: Head: Unremarkable with good dentition. Oropharynx clear without exudate.  Eyes: External eye exam normal without stare, lid lag or exophthalmos.  EOM intact. .  Neck: General: Supple without adenopathy or carotid bruits. Thyroid: Thyroid size normal.  No goiter or nodules appreciated. No thyroid bruit.  Lungs: Clear with good BS bilat with no rales, rhonchi, or wheezes  Heart: RRR   Abdomen: Normoactive bowel sounds, soft, nontender, without masses or organomegaly palpable  Extremities:  Lower extremities - No pretibial edema. No lesions.  Skin: Normal texture and temperature to palpation. No rash noted.   Neuro: MS is good with appropriate affect, pt is alert and Ox3    DM foot exam: 01/24/2021  The skin of the feet is intact without sores or ulcerations. The pedal pulses are 2+ on right and 2+ on left. The sensation is intact to a  screening 5.07, 10 gram monofilament bilaterally   DATA REVIEWED:  Lab Results  Component Value Date   HGBA1C 9.9 (H) 01/02/2021   HGBA1C 9.2 (H) 08/08/2020   HGBA1C 9.6 (H) 04/18/2020   Lab Results  Component Value Date   MICROALBUR 3.7 (H) 01/02/2021   LDLCALC 98 08/08/2020   CREATININE 0.83 01/02/2021   Lab Results  Component Value Date   MICRALBCREAT 3.0 01/02/2021    Lab Results  Component Value Date   CHOL 162 01/02/2021   HDL 32.80 (L) 01/02/2021   LDLCALC 98 08/08/2020   LDLDIRECT 122.0 01/02/2021   TRIG 234.0 (H) 01/02/2021   CHOLHDL 5 01/02/2021        ASSESSMENT / PLAN / RECOMMENDATIONS:   1) Type 2Diabetes Mellitus, Poorly controlled, Without complications - Most recent A1c of 9.9 %. Goal A1c < 7.0 %.    Plan: GENERAL: I have discussed with the patient the pathophysiology of diabetes. We went over the natural progression of the disease. We talked about both insulin resistance and insulin deficiency. We stressed the importance of lifestyle changes including diet and exercise. I explained the complications associated with diabetes including retinopathy, nephropathy, neuropathy as well as increased risk of cardiovascular disease. We went over the benefit seen with glycemic control.    I explained to the patient that diabetic patients are at higher than normal risk for amputations.   We discussed the importance of low carb diet as well as importance of incorprating exercise up to 150 minutes a week   Discussed risk of hypoglycemia with SU and the importance of taking this within 30 minutes of eating   Januvia - cost prohibitive   Discussed adding SGLT-2 inhibitors, coupon provided, discussed benefits  as well as risk of genital infections   Intolerant to higher doses of metformin   MEDICATIONS:  Stop Glimepiride - Start Glipizide 5 mg, 1 tablet before Breakfast and 1 tablet before Supper - Continue Metformin 500 mg XR  - Start Farxiga 5 mg, 1 tablet in  the morning    EDUCATION / INSTRUCTIONS:  BG monitoring instructions: Patient is instructed to check her blood sugars 1  times a day, fasting . Marland Kitchen Call [facility] [facility] [facility] if: BG persistently < 70 I reviewed the Rule of 15 for the treatment of hypoglycemia in detail with the patient. Literature supplied.   2) Diabetic complications:   Eye: Does not have known diabetic retinopathy.   Neuro/ Feet: Does not have known diabetic peripheral neuropathy.  Renal: Patient does not have known baseline CKD. She is not on an ACEI/ARB at present.   3) Mixed Dyslipidemia: LDL elevated at 122 mg/dL and Tg elevated at 007 mg/dL from 09/2195  Labs. Discussed cardiovascular benefits of stating.  - Discussed importance of low fat/low carb diet and exercise . Will monitor at this time   Medication  Continue Atorvastatin 20 mg daily    F/U in 3 months     Signed electronically by: Lyndle Herrlich, MD  [facility] Endocrinology  [facility] [address] [place], [place] [address] Phone: [phone] FAX: [phone]   CC: [name], MD [address] [place] [address] Phone: [phone]  Fax: [phone]    Return to Endocrinology clinic as below: Future Appointments  Date Time Provider Department Center  01/24/2021  9:40 AM [facility]  01/24/2021  2:45 PM Marcelline Mates, PT [facility]  02/21/2021  1:45 PM Rodolph Bong, MD [facility] None  02/21/2021  3:30 PM Altamese Cabal, RD [facility]  04/10/2021  2:20 PM [name], MD [facility]  08/29/2021  9:00 AM [name], MD [facility]

## 2021-01-24 NOTE — Therapy (Addendum)
[facility] [address]  [place], [place], [address] Phone: [phone]   Fax:  [phone]  Physical Therapy Evaluation  Patient Details  Name: Chelsea Bray MRN: [id number] Date of Birth: 1971/08/15 Referring Provider (PT): Lynne Leader, MD   Encounter Date: 01/24/2021   PT End of Session - 01/24/21 1510     Visit Number 1    Number of Visits 8    Date for PT Re-Evaluation 04/18/21    Authorization Type [employer]    PT Start Time 0881    PT Stop Time 1031    PT Time Calculation (min) 57 min    Activity Tolerance Patient tolerated treatment well    Behavior During Therapy [facility] for tasks assessed/performed             Past Medical History:  Diagnosis Date   Acid reflux disease    Arthritis    Colon polyps 02/21/2017   Diabetes ([place]) 11/17/2011   Diabetes mellitus type 2 in obese ([place]) 11/17/2011   History of esophageal stricture 02/21/2017   Hyperlipidemia    Hypertension    IBS (irritable bowel syndrome)    Insomnia 03/08/2016   Lactose intolerance    Morbid obesity ([place]) 01/10/2013   Pain in joint, upper arm 11/08/2015   Preventative health care 11/29/2013   Sleep apnea    c pap   Swelling of both lower extremities    TOS (thoracic outlet syndrome) 11/20/2015   Vaginitis and vulvovaginitis 12/23/2014   Vitamin D deficiency     Past Surgical History:  Procedure Laterality Date   BREAST REDUCTION SURGERY  2001   CEREBRAL MICROVASCULAR DECOMPRESSION  2006   back of her head   CHOLECYSTECTOMY     COLONOSCOPY     GALLBLADDER SURGERY  1991   gall stone removed   MOLE REMOVAL     POLYPECTOMY     REDUCTION MAMMAPLASTY Bilateral 2001    There were no vitals filed for this visit.    Subjective Assessment - 01/24/21 1455     Subjective Pt reports right arm pain of 2 months onset, insidious in nature. Her doctor told her it was coming from her neck. Yesterday, she was having swelling in the right  UT and pain in her R thumb. Nighttime is the worst for some reason. She can't really pinpoint what triggers it. Though certain movements like using her R UE for maps in the car hurt her shoulder. She was told she has some arthritis. 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    Pertinent History Chiari Malformation decompression 2006, Cholecystectomy 1991    Limitations Other (comment)   driving   Diagnostic tests XR Cervical: Degenerative changes at C5-6.  No other significant abnormalities.    Patient Stated Goals Get pain free mobility back, get rid of tailbone pain by correcting posture    Currently in Pain? Yes    Pain Score 3    8/10 at worst   Pain Location Arm    Pain Orientation Right   thumb right now   Pain Descriptors / Indicators Other (Comment)   stinging   Pain Type Acute pain    Pain Radiating Towards down arm    Pain Onset More than a month ago    Pain Frequency Intermittent    Aggravating Factors  nighttime, prolonged sitting, poor posture    Pain Relieving Factors Aleve  back and muscle    Effect of Pain on Daily Activities hard to fall asleep                [facility] PT Assessment - 01/24/21 0001       Assessment   Medical Diagnosis M54.12 (ICD-10-CM) - Cervical radiculitis  M62.838 (ICD-10-CM) - Neck muscle spasm    Referring Provider (PT) Lynne Leader, MD    Onset Date/Surgical Date 11/30/20    Hand Dominance Right    Next MD Visit 02/21/21    Prior Therapy for R UT pain and swelling      Precautions   Precautions None      Restrictions   Weight Bearing Restrictions No      Balance Screen   Has the patient fallen in the past 6 months No    Has the patient had a decrease in activity level because of a fear of falling?  No    Is the patient reluctant to leave their home because of a fear of falling?  No      Prior Function   Level of Independence Independent    Vocation Full time employment    Vocation Requirements Driving       Observation/Other Assessments   Focus on Therapeutic Outcomes (FOTO)  63% ability, 72% predicted      Posture/Postural Control   Posture/Postural Control Postural limitations    Postural Limitations Forward head;Rounded Shoulders;Increased thoracic kyphosis    Posture Comments significant anterior translation of C5-6 on C7      ROM / Strength   AROM / PROM / Strength AROM;PROM;Strength      AROM   Overall AROM Comments shoulders [facility]    AROM Assessment Site Cervical    Cervical Flexion 45    Cervical Extension 35    Cervical - Right Side Bend 33    Cervical - Left Side Bend 32    Cervical - Right Rotation 70    Cervical - Left Rotation 75      Strength   Strength Assessment Site Shoulder    Right/Left Shoulder Right;Left    Right Shoulder Flexion 4-/5   pain   Right Shoulder ABduction 4-/5   pain   Right Shoulder Internal Rotation 4+/5    Right Shoulder External Rotation 3+/5    Left Shoulder Flexion 4/5    Left Shoulder ABduction 4/5    Left Shoulder Internal Rotation 4+/5    Left Shoulder External Rotation 4+/5      Palpation   Palpation comment increased soft tissue tension to R pecs and R UT                        Objective measurements completed on examination: See above findings.               PT Education - 01/24/21 1554     Education Details FOTO, diagnosis, prognosis, posture, HEP, and POC    Person(s) Educated Patient    Methods Explanation;Demonstration;Tactile cues;Verbal cues;Handout   emailed HEP   Comprehension Verbalized understanding;Returned demonstration;Verbal cues required;Tactile cues required              PT Short Term Goals - 01/24/21 1555       PT SHORT TERM GOAL #1   Title Pt will be independent and compliant with HEP.    Baseline provided at eval    Time 4    Period Weeks    Status New  Target Date 02/21/21               PT Long Term Goals - 01/24/21 1555       PT LONG TERM GOAL #1   Title  Pt will be independent with long term HEP for maintenance and to address any reoccurrence.    Time 8    Period Weeks    Status New    Target Date 03/21/21      PT LONG TERM GOAL #2   Title Pt will demonstrate pain free R shoulder MMT of at least 4+/5.    Baseline see flowsheet, pain    Time 8    Period Weeks    Status New    Target Date 03/21/21      PT LONG TERM GOAL #3   Title Pt will report </= 3/10 pain at worst with prolonged driving for work.    Baseline 7/10    Time 8    Period Weeks    Status New    Target Date 03/21/21      PT LONG TERM GOAL #4   Title Pt will verbalize techniques, postures, tools and exercises to manage neck/shoulder pain and report ability to control pain with prolonged driving, as needed for work.    Time 8    Period Weeks    Status New    Target Date 03/21/21      PT LONG TERM GOAL #5   Title Pt will increase FOTO ability to at least 72% ability, in order to demonstrate meaningful change in perceived level of functional ability.    Baseline 63% ability    Time 8    Period Weeks    Status New    Target Date 03/21/21                    Plan - 01/24/21 1548     Clinical Impression Statement Pt is a 50 yo female who presents to OP PT with 2 months onset of neck/R UE pain. She denies n/t, but reports pain that can refer down to R thumb. She demonstrates impairments in postural limitations including forward head and shoulders, decreased AROM cervical spine and painful R shoulder IR, decreased R shoulder MMT, and TTP R UT/pecs. Pt was educated on FOTO, diagnosis, prognosis, posture, HEP, and POC verbalizing understanding and consent to tx. She is a full time driver, requiring long periods of sitting which causes pain at the end of the day. She requested an HEP for now, due to likely traveling the next 2-3 weeks, but will schedule for when she returns. She would benefit from skilled PT 1-2x/week for 6-8 weeks to address impairments for ability  to participate in job without pain.    Personal Factors and Comorbidities Comorbidity 3+;Fitness;Past/Current Experience;Profession    Comorbidities Arthritis, HLD, HTN, DM II    Examination-Activity Limitations Sleep;Sit    Examination-Participation Restrictions Occupation;Driving    Stability/Clinical Decision Making Stable/Uncomplicated    Clinical Decision Making Low    Rehab Potential Good    PT Frequency --   1-2x/week   PT Duration 8 weeks    PT Treatment/Interventions ADLs/Self Care Home Management;Cryotherapy;Electrical Stimulation;Iontophoresis 24m/ml Dexamethasone;Moist Heat;Traction;Therapeutic activities;Therapeutic exercise;Neuromuscular re-education;Patient/family education;Manual techniques;Passive range of motion;Dry needling;Taping;Vasopneumatic Device;Spinal Manipulations;Joint Manipulations    PT Next Visit Plan Assess HEP/update PRN, pec flexibility, postural alignment, cervical posture, DNF strength, periscapular strength    PT Home Exercise Plan [email or web address]    Consulted and Agree with Plan of Care  Patient             Patient will benefit from skilled therapeutic intervention in order to improve the following deficits and impairments:  Hypomobility,Postural dysfunction,Pain,Improper body mechanics,Impaired tone,Impaired perceived functional ability,Decreased strength,Decreased range of motion  Visit Diagnosis: Cervicalgia  Pain in right arm  Abnormal posture  Muscle weakness (generalized)     Problem List Patient Active Problem List   Diagnosis Date Noted   Right foot pain 01/02/2021   Nasal congestion 01/02/2021   Right shoulder pain 01/02/2021   Diarrhea 10/05/2019   Educated about COVID-19 virus infection 01/07/2019   Myalgia 11/12/2018   Type 2 diabetes mellitus with hyperglycemia, without long-term current use of insulin ([place]) 08/12/2018   Hip pain, chronic, left 05/11/2018   Colon polyps 02/21/2017   History of esophageal stricture 02/21/2017    Insomnia 03/08/2016   Palpitations 11/20/2015   TOS (thoracic outlet syndrome) 11/20/2015   Arthralgia 11/08/2015   Allergic rhinitis 11/18/2014   Atypical chest pain 11/29/2013   Vitamin D deficiency 11/29/2013   Preventative health care 11/29/2013   Carpal tunnel syndrome of right wrist 07/21/2013   Headache(784.0) 03/11/2013   Class 2 severe obesity with serious comorbidity and body mass index (BMI) of 38.0 to 38.9 in adult ([place]) 01/10/2013   Eustachian tube dysfunction 08/13/2012   Essential hypertension 06/13/2012   Neuralgia 12/12/2011   Diabetes mellitus type 2 in obese ([place]) 11/17/2011   Back pain 11/16/2011   Knee pain 09/29/2011   Hyperlipidemia, mixed 09/29/2011   GERD (gastroesophageal reflux disease) 09/29/2011   OSA (obstructive sleep apnea) 09/29/2011    Izell Finland, PT, DPT 01/24/2021, 4:04 PM  [facility] [address]  [place], [place], [address] Phone: [phone]   Fax:  [phone]  Name: Chelsea Bray MRN: [id number] Date of Birth: July 21, 1971   PHYSICAL THERAPY DISCHARGE SUMMARY  Visits from Start of Care: 1  Current functional level related to goals / functional outcomes: See above clinical impression; patient did not return   Remaining deficits: See above   Education / Equipment: HEP  Plan: Patient agrees to discharge.  Patient goals were not met. Patient is being discharged due not returning since last session.     Janene Harvey, PT, DPT 02/23/21 11:48 AM

## 2021-01-24 NOTE — Patient Instructions (Signed)
-   Stop Glimepiride - Start Glipizide 5 mg, 1 tablet before Breakfast and 1 tablet before Supper - Continue Metformin 500 mg XR  - Start Farxiga 5 mg, 1 tablet in the morning       HOW TO TREAT LOW BLOOD SUGARS (Blood sugar LESS THAN 70 MG/DL)  Please follow the RULE OF 15 for the treatment of hypoglycemia treatment (when your (blood sugars are less than 70 mg/dL)    STEP 1: Take 15 grams of carbohydrates when your blood sugar is low, which includes:   3-4 GLUCOSE TABS  OR  3-4 OZ OF JUICE OR REGULAR SODA OR  ONE TUBE OF GLUCOSE GEL     STEP 2: RECHECK blood sugar in 15 MINUTES STEP 3: If your blood sugar is still low at the 15 minute recheck --> then, go back to STEP 1 and treat AGAIN with another 15 grams of carbohydrates.

## 2021-01-24 NOTE — Patient Instructions (Signed)
Access Code: [email or web address] URL: [email or web address] Date: 01/24/2021 Prepared by: Gardiner Rhyme  Exercises Open Books - 1-2 x daily - 7 x weekly - 1-2 sets - 10-15 reps Supine Cervical Retraction with Towel - 1-2 x daily - 7 x weekly - 1 sets - 10 reps - 5 seconds hold Seated Cervical Retraction - 3-10 x daily - 7 x weekly - 1 sets - 10 reps - 5 seconds hold Seated Scapular Retraction with External Rotation - 1-2 x daily - 7 x weekly - 1-2 sets - 10 reps - 3-5 seconds hold Standing Shoulder Extension with Resistance - 1 x daily - 7 x weekly - 3 sets - 10 reps Standing Bilateral Low Shoulder Row with Anchored Resistance - 1 x daily - 7 x weekly - 2 sets - 10-15 reps Corner Pec Major Stretch - 2 x daily - 7 x weekly - 3 sets - 20-30 seconds hold Corner Pec Minor Stretch - 2 x daily - 7 x weekly - 3 sets - 20-30 seconds hold

## 2021-02-16 ENCOUNTER — Other Ambulatory Visit: Payer: Self-pay | Admitting: Family Medicine

## 2021-02-21 ENCOUNTER — Ambulatory Visit: Payer: 59 | Admitting: Dietician

## 2021-02-21 ENCOUNTER — Ambulatory Visit: Payer: 59 | Admitting: Family Medicine

## 2021-03-31 ENCOUNTER — Encounter: Payer: Self-pay | Admitting: Pulmonary Disease

## 2021-03-31 ENCOUNTER — Other Ambulatory Visit: Payer: Self-pay

## 2021-03-31 ENCOUNTER — Ambulatory Visit (INDEPENDENT_AMBULATORY_CARE_PROVIDER_SITE_OTHER): Payer: 59 | Admitting: Pulmonary Disease

## 2021-03-31 VITALS — BP 140/86 | HR 55 | Ht 65.0 in | Wt 238.4 lb

## 2021-03-31 DIAGNOSIS — G4726 Circadian rhythm sleep disorder, shift work type: Secondary | ICD-10-CM

## 2021-03-31 DIAGNOSIS — J310 Chronic rhinitis: Secondary | ICD-10-CM | POA: Diagnosis not present

## 2021-03-31 DIAGNOSIS — G4733 Obstructive sleep apnea (adult) (pediatric): Secondary | ICD-10-CM

## 2021-03-31 DIAGNOSIS — G4730 Sleep apnea, unspecified: Secondary | ICD-10-CM | POA: Diagnosis not present

## 2021-03-31 DIAGNOSIS — T485X5A Adverse effect of other anti-common-cold drugs, initial encounter: Secondary | ICD-10-CM

## 2021-03-31 DIAGNOSIS — E669 Obesity, unspecified: Secondary | ICD-10-CM

## 2021-03-31 NOTE — Progress Notes (Signed)
[facility]  Chief Complaint  Patient presents with   Consult    Patient reports having to use her CPAP x 2 days and feeling more nauseated and tired.     Constitutional:  BP 140/86 (BP Location: Right Arm, Patient Position: Sitting, Cuff Size: Normal)   Pulse (!) 55   Ht 5\' 5"  (1.651 m)   Wt 238 lb 6.4 oz (108.1 kg)   SpO2 99%   BMI 39.67 kg/m   Past Medical History:  GERD, OA, Colon polyps, DM type 2, HLD, HTN, IBS, Lactose intolerance, Thoracic outlet syndrome, Vit D deficiency  Past Surgical History:  She  has a past surgical history that includes Gallbladder surgery (1991); Cerebral microvascular decompression (2006); Breast reduction surgery (2001); Cholecystectomy; Mole removal; Reduction mammaplasty (Bilateral, 2001); Colonoscopy; and Polypectomy.  Brief Summary:  Chelsea Bray is a 50 y.o. female with obstructive sleep apnea.  She has a DOT license.      Subjective:   I saw her previously I 2017.  She had home sleep study then that showed severe obstructive sleep apnea.  She was to get new auto CPAP then, but had issue with lack of insurance coverage.  As a result she has been using same device since 2008.  She has been purchasing replacement parts from 2009.  She uses a nasal mask.  Her sleep schedule fluctuates based on her work schedule.  She gets up at 3 am on work days, but 8 am on weekends.  She tries to get about 6.5 hrs sleep per day.  It takes her about 30 minutes to fall asleep.  She uses white noise to help sleep.  She drinks coffee in the morning on work days.  Not using anything to help sleep.  She denies sleep walking, sleep talking, bruxism, or nightmares.  There is no history of restless legs.  She denies sleep hallucinations, sleep paralysis, or cataplexy.  The Epworth score is 10 out of 24.  She has trouble with sinus congestion and allergies.  She has been using afrin, but is trying to limit this.   Tried flonase briefly before, but didn't seem to help.  Physical Exam:   Appearance - well kempt   ENMT - no sinus tenderness, no oral exudate, no LAN, Mallampati 4 airway, no stridor  Respiratory - equal breath sounds bilaterally, no wheezing or rales  CV - s1s2 regular rate and rhythm, no murmurs  Ext - no clubbing, no edema  Skin - no rashes  Psych - normal mood and affect   Sleep Tests:  HST 02/06/16 >> AHI 30.4, SaO2 low 84%.  Cardiac Tests:  Echo 03/06/17 >> EF 55 to 60%  Social History:  She  reports that she has never smoked. She has never used smokeless tobacco. She reports current alcohol use. She reports that she does not use drugs.  Family History:  Her family history includes Alcohol abuse in her paternal grandmother; Alcoholism in her father; Arrhythmia in her mother; Asthma in her daughter; Cancer in her father and maternal grandfather; Colon cancer in her maternal grandfather; Colon polyps in her mother; Diabetes in her maternal grandmother and mother; Heart attack in her father; Heart disease in her father, maternal grandmother, and paternal grandfather; Hyperlipidemia in her father; Hypertension in her father, mother, and another family member; Prostate cancer in her maternal grandfather; Stroke in her father and maternal grandmother.     Assessment/Plan:   Obstructive sleep apnea. -  she is compliant with therapy and reports benefit from CPAP - her current machine is ancient and she will need a new device - will need to repeat home sleep study to updated current status of sleep apnea before arranging for new equipment - discussed different treatment options for treating sleep apnea - she is okay with getting a Luna device - she might purchase a travel machine on her own  CPAP rhinitis. 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 - discussed importance of avoiding use of afrin and that it can take several weeks if not longer to adjust to not using afrin  Obesity. - discussed how her weight can impact her health, particularly in relation to sleep apnea  Time Spent Involved in Patient Care on Day of Examination:  47 minutes  Follow up:   Patient Instructions  Try using saline nasal spray and flonase prior to going to bed Try to limit use of afrin Will arrange for home sleep study Will call to arrange for follow up after sleep study reviewed  Medication List:   Allergies as of 03/31/2021   No Known Allergies      Medication List        Accurate as of March 31, 2021  9:49 AM. If you have any questions, ask your nurse or doctor.          atorvastatin 20 MG tablet Commonly known as: LIPITOR Take 1 tablet (20 mg total) by mouth daily.   azelastine 0.05 % ophthalmic solution Commonly known as: OPTIVAR Place 1 drop into both eyes 2 (two) times daily.   BENFOTIAMINE PO Take 300 mg by mouth daily.   BIOTIN PO Take by mouth.   CULTURELLE PO Take by mouth.   dapagliflozin propanediol 5 MG Tabs tablet Commonly known as: Farxiga Take 1 tablet (5 mg total) by mouth daily before breakfast.   diphenoxylate-atropine 2.5-0.025 MG tablet Commonly known as: LOMOTIL TAKE 1 TABLET BY MOUTH 4 TIMES DAILY AS NEEDED FOR DIARRHEA OR  LOOSE  STOOLS   ELDERBERRY PO Take 1 tablet by mouth daily.   gabapentin 300 MG capsule Commonly known as: NEURONTIN Take 1 capsule (300 mg total) by mouth 3 (three) times daily as needed.   glipiZIDE 5 MG tablet Commonly known as: GLUCOTROL Take 1 tablet (5 mg total) by mouth 2 (two) times daily before a meal.   hydrochlorothiazide 25 MG tablet Commonly known as: HYDRODIURIL Take 1 tablet (25 mg total) by mouth daily.   hyoscyamine 0.125 MG SL tablet Commonly known as: LEVSIN SL Place 1 tablet (0.125 mg total) under the tongue  every 4 (four) hours as needed.   levonorgestrel 20 MCG/24HR IUD Commonly known as: MIRENA 1 each by Intrauterine route once.   metFORMIN 500 MG 24 hr tablet Commonly known as: GLUCOPHAGE-XR Take 1 tablet by mouth once daily with breakfast   metoprolol succinate 50 MG 24 hr tablet Commonly known as: TOPROL-XL TAKE 1 TABLET BY MOUTH ONCE DAILY WITH FOOD OR  IMMEDIATELY  FOLLOWING  A  MEAL   Multivitamin Adults Tabs Take by mouth daily.   naproxen 375 MG tablet Commonly known as: NAPROSYN Take 1 tablet (375 mg total) by mouth 2 (two) times daily with a meal.   UNABLE TO FIND Take 2 tablets by mouth daily. Med Name: Ivan Anchors   VITAMIN C  PO Take by mouth daily.   Vitamin D (Ergocalciferol) 1.25 MG (50000 UNIT) Caps capsule Commonly known as: DRISDOL TAKE 1 CAPSULE BY MOUTH ONCE A WEEK   zinc gluconate 50 MG tablet Take 50 mg by mouth daily.        Signature:  Coralyn Helling, MD [facility]/Critical Care Pager - [phone] 03/31/2021, 9:49 AM

## 2021-03-31 NOTE — Patient Instructions (Signed)
Try using saline nasal spray and flonase prior to going to bed Try to limit use of afrin Will arrange for home sleep study Will call to arrange for follow up after sleep study reviewed

## 2021-04-10 ENCOUNTER — Ambulatory Visit: Payer: 59 | Admitting: Family Medicine

## 2021-04-10 ENCOUNTER — Telehealth: Payer: Self-pay | Admitting: Pulmonary Disease

## 2021-04-10 NOTE — Telephone Encounter (Signed)
Dee from [facility] is calling in regards to a PA for a sleep study; procedure code; 67591. She stated that it was approved. Pls regard; [phone].  Authorization Number; [id number]

## 2021-04-10 NOTE — Progress Notes (Incomplete)
Subjective:   By signing my name below, I, Chelsea Bray, attest that this documentation has been prepared under the direction and in the presence of Chelsea Canary, MD 04/10/2021   Patient ID: Chelsea Bray, female    DOB: Apr 07, 1971, 50 y.o.   MRN: [id number]  No chief complaint on file.   HPI Patient is in today for an office visit.  Past Medical History:  Diagnosis Date   Acid reflux disease    Arthritis    Colon polyps 02/21/2017   Diabetes (HCC) 11/17/2011   Diabetes mellitus type 2 in obese (HCC) 11/17/2011   History of esophageal stricture 02/21/2017   Hyperlipidemia    Hypertension    IBS (irritable bowel syndrome)    Insomnia 03/08/2016   Lactose intolerance    Morbid obesity (HCC) 01/10/2013   Pain in joint, upper arm 11/08/2015   Preventative health care 11/29/2013   Sleep apnea    c pap   Swelling of both lower extremities    TOS (thoracic outlet syndrome) 11/20/2015   Vaginitis and vulvovaginitis 12/23/2014   Vitamin D deficiency     Past Surgical History:  Procedure Laterality Date   BREAST REDUCTION SURGERY  2001   CEREBRAL MICROVASCULAR DECOMPRESSION  2006   back of her head   CHOLECYSTECTOMY     COLONOSCOPY     GALLBLADDER SURGERY  1991   gall stone removed   MOLE REMOVAL     POLYPECTOMY     REDUCTION MAMMAPLASTY Bilateral 2001    Family History  Problem Relation Age of Onset   Heart disease Maternal Grandmother        MI   Stroke Maternal Grandmother    Diabetes Maternal Grandmother    Arrhythmia Mother    Diabetes Mother        maternal grandmother and aunts   Hypertension Mother    Colon polyps Mother    Heart attack Father    Stroke Father    Hyperlipidemia Father    Hypertension Father    Cancer Father        lung, smoker   Heart disease Father        CABG x 4   Alcoholism Father    Prostate cancer Maternal Grandfather    Cancer Maternal Grandfather        colon cancer, prostate   Colon cancer Maternal Grandfather    Alcohol  abuse Paternal Grandmother        died of pneumonia   Heart disease Paternal Grandfather        MI   Asthma Daughter        allergies   Hypertension Other        parents and maternal parents   Breast cancer Neg Hx    Esophageal cancer Neg Hx    Rectal cancer Neg Hx    Stomach cancer Neg Hx     Social History   Socioeconomic History   Marital status: Single    Spouse name: Not on file   Number of children: Not on file   Years of education: Not on file   Highest education level: Not on file  Occupational History   Occupation: Midwife  Tobacco Use   Smoking status: Never   Smokeless tobacco: Never  Vaping Use   Vaping Use: Never used  Substance and Sexual Activity   Alcohol use: Yes    Alcohol/week: 0.0 standard drinks    Comment: wine ocassion   Drug use:  No   Sexual activity: Yes    Birth control/protection: None, Surgical    Comment: avoid dairy, lives with husband, daughters. works at school bus driver  Other Topics Concern   Not on file  Social History Narrative   Not on file   Social Determinants of Health   Financial Resource Strain: Not on file  Food Insecurity: Not on file  Transportation Needs: Not on file  Physical Activity: Not on file  Stress: Not on file  Social Connections: Not on file  Intimate Partner Violence: Not on file    Outpatient Medications Prior to Visit  Medication Sig Dispense Refill   Ascorbic Acid (VITAMIN C PO) Take by mouth daily.     atorvastatin (LIPITOR) 20 MG tablet Take 1 tablet (20 mg total) by mouth daily. 90 tablet 3   azelastine (OPTIVAR) 0.05 % ophthalmic solution Place 1 drop into both eyes 2 (two) times daily. 6 mL 1   BENFOTIAMINE PO Take 300 mg by mouth daily.     BIOTIN PO Take by mouth.     dapagliflozin propanediol (FARXIGA) 5 MG TABS tablet Take 1 tablet (5 mg total) by mouth daily before breakfast. 30 tablet 6   diphenoxylate-atropine (LOMOTIL) 2.5-0.025 MG tablet TAKE 1 TABLET BY MOUTH 4 TIMES DAILY AS  NEEDED FOR DIARRHEA OR  LOOSE  STOOLS 15 tablet 0   ELDERBERRY PO Take 1 tablet by mouth daily.     gabapentin (NEURONTIN) 300 MG capsule Take 1 capsule (300 mg total) by mouth 3 (three) times daily as needed. 90 capsule 3   glipiZIDE (GLUCOTROL) 5 MG tablet Take 1 tablet (5 mg total) by mouth 2 (two) times daily before a meal. 180 tablet 1   hydrochlorothiazide (HYDRODIURIL) 25 MG tablet Take 1 tablet (25 mg total) by mouth daily. 90 tablet 1   hyoscyamine (LEVSIN SL) 0.125 MG SL tablet Place 1 tablet (0.125 mg total) under the tongue every 4 (four) hours as needed. 30 tablet 1   Lactobacillus Rhamnosus, GG, (CULTURELLE PO) Take by mouth.     levonorgestrel (MIRENA) 20 MCG/24HR IUD 1 each by Intrauterine route once.     metFORMIN (GLUCOPHAGE-XR) 500 MG 24 hr tablet Take 1 tablet by mouth once daily with breakfast 90 tablet 1   metoprolol succinate (TOPROL-XL) 50 MG 24 hr tablet TAKE 1 TABLET BY MOUTH ONCE DAILY WITH FOOD OR  IMMEDIATELY  FOLLOWING  A  MEAL 90 tablet 1   Multiple Vitamins-Minerals (MULTIVITAMIN ADULTS) TABS Take by mouth daily.     naproxen (NAPROSYN) 375 MG tablet Take 1 tablet (375 mg total) by mouth 2 (two) times daily with a meal. 60 tablet 1   UNABLE TO FIND Take 2 tablets by mouth daily. Med Name: [facility]     Vitamin D, Ergocalciferol, (DRISDOL) 1.25 MG (50000 UNIT) CAPS capsule TAKE 1 CAPSULE BY MOUTH ONCE A WEEK 12 capsule 0   zinc gluconate 50 MG tablet Take 50 mg by mouth daily.     No facility-administered medications prior to visit.    No Known Allergies  ROS     Objective:    Physical Exam Constitutional:      General: She is not in acute distress. HENT:     Head: Normocephalic and atraumatic.     Right Ear: External ear normal.     Left Ear: External ear normal.  Eyes:     Conjunctiva/sclera: Conjunctivae normal.  Cardiovascular:     Rate and Rhythm: Normal rate  and regular rhythm.     Heart sounds: Normal heart sounds. No murmur  heard. Pulmonary:     Effort: No respiratory distress.     Breath sounds: Normal breath sounds.  Abdominal:     General: Bowel sounds are normal. There is no distension.     Palpations: Abdomen is soft.     Tenderness: There is no abdominal tenderness.  Musculoskeletal:     Cervical back: Neck supple.  Lymphadenopathy:     Cervical: No cervical adenopathy.  Skin:    General: Skin is warm and dry.  Neurological:     Mental Status: She is alert and oriented to person, place, and time.  Psychiatric:        Behavior: Behavior normal.    There were no vitals taken for this visit. Wt Readings from Last 3 Encounters:  03/31/21 238 lb 6.4 oz (108.1 kg)  01/24/21 241 lb (109.3 kg)  01/13/21 242 lb 12.8 oz (110.1 kg)    Diabetic Foot Exam - Simple   No data filed    Lab Results  Component Value Date   WBC 6.7 01/02/2021   HGB 13.4 01/02/2021   HCT 39.5 01/02/2021   PLT 327.0 01/02/2021   GLUCOSE 163 (H) 01/02/2021   CHOL 162 01/02/2021   TRIG 234.0 (H) 01/02/2021   HDL 32.80 (L) 01/02/2021   LDLDIRECT 122.0 01/02/2021   LDLCALC 98 08/08/2020   ALT 20 01/02/2021   AST 19 01/02/2021   NA 137 01/02/2021   K 3.9 01/02/2021   CL 102 01/02/2021   CREATININE 0.83 01/02/2021   BUN 12 01/02/2021   CO2 28 01/02/2021   TSH 0.90 01/02/2021   HGBA1C 9.9 (H) 01/02/2021   MICROALBUR 3.7 (H) 01/02/2021    Lab Results  Component Value Date   TSH 0.90 01/02/2021   Lab Results  Component Value Date   WBC 6.7 01/02/2021   HGB 13.4 01/02/2021   HCT 39.5 01/02/2021   MCV 93.6 01/02/2021   PLT 327.0 01/02/2021   Lab Results  Component Value Date   NA 137 01/02/2021   K 3.9 01/02/2021   CO2 28 01/02/2021   GLUCOSE 163 (H) 01/02/2021   BUN 12 01/02/2021   CREATININE 0.83 01/02/2021   BILITOT 0.3 01/02/2021   ALKPHOS 55 01/02/2021   AST 19 01/02/2021   ALT 20 01/02/2021   PROT 6.8 01/02/2021   ALBUMIN 3.8 01/02/2021   CALCIUM 9.0 01/02/2021   ANIONGAP 10 03/22/2015    GFR 82.12 01/02/2021   Lab Results  Component Value Date   CHOL 162 01/02/2021   Lab Results  Component Value Date   HDL 32.80 (L) 01/02/2021   Lab Results  Component Value Date   LDLCALC 98 08/08/2020   Lab Results  Component Value Date   TRIG 234.0 (H) 01/02/2021   Lab Results  Component Value Date   CHOLHDL 5 01/02/2021   Lab Results  Component Value Date   HGBA1C 9.9 (H) 01/02/2021       Assessment & Plan:   Problem List Items Addressed This Visit   None    No orders of the defined types were placed in this encounter.   Arvil Persons, MD , personally preformed the services described in this documentation.  All medical record entries made by the scribe were at my direction and in my presence.  I have reviewed the chart and discharge instructions (if applicable) and agree that the record reflects my personal performance and  is accurate and complete. 04/10/2021   I,Chelsea Bray,acting as a scribe for Danise Edge, MD.,have documented all relevant documentation on the behalf of Danise Edge, MD,as directed by  Danise Edge, MD while in the presence of Danise Edge, MD.     Mateo Flow

## 2021-05-08 NOTE — Progress Notes (Deleted)
Name: Chelsea Bray  Age/ Sex: 50 y.o., female   MRN/ DOB: [id number], December 19, 1970     PCP: [name], MD   Reason for Endocrinology Evaluation: Type 2 Diabetes Mellitus  Initial Endocrine Consultative Visit: 01/24/2021    PATIENT IDENTIFIER: [name] is a 50 y.o. female with a past medical history of T2DM, HTN and dyslipidemia . The patient has followed with Endocrinology clinic since 5 /06/2021 for consultative assistance with management of her diabetes.  DIABETIC HISTORY:  Ms. Hayton was diagnosed with DM in 2014. She is intolerant to high doses of metformin. Her hemoglobin A1c has ranged from  6.5% in 2013, peaking at 9.9% in 2022.     On her initial visit she had an A1c of 9.9% , she was on Metformin, Glimepiride and had [place] on her med list but was not taking due to cost.    SUBJECTIVE:   During the last visit (01/24/2021): A1c 9.9%  Today (05/08/2021): Ms. Hornik  She checks her blood sugars *** times daily, preprandial to breakfast and ***. The patient has *** had hypoglycemic episodes since the last clinic visit, which typically occur *** x / - most often occuring ***. The patient is *** symptomatic with these episodes, with symptoms of {symptoms; hypoglycemia:9084048}.    HOME DIABETES REGIMEN:  Glipizide 5 mg, 1 tablet before Breakfast and 1 tablet before Supper Metformin 500 mg XR  Farxiga 5 mg, 1 tablet in the morning   Statin: yes ACE-I/ARB: no Prior Diabetic Education: yes   METER DOWNLOAD SUMMARY: Date range evaluated: *** Fingerstick Blood Glucose Tests = *** Average Number Tests/Day = *** Overall Mean FS Glucose = *** Standard Deviation = ***  BG Ranges: Low = *** High = ***   Hypoglycemic Events/30 Days: BG < 50 = *** Episodes of symptomatic severe hypoglycemia = ***    DIABETIC COMPLICATIONS: Microvascular complications:  *** Denies:  Last Eye Exam: Completed   Macrovascular complications:   Denies: CAD,  CVA, PVD   HISTORY:  Past Medical History:  Past Medical History:  Diagnosis Date   Acid reflux disease    Arthritis    Colon polyps 02/21/2017   Diabetes (HCC) 11/17/2011   Diabetes mellitus type 2 in obese (HCC) 11/17/2011   History of esophageal stricture 02/21/2017   Hyperlipidemia    Hypertension    IBS (irritable bowel syndrome)    Insomnia 03/08/2016   Lactose intolerance    Morbid obesity (HCC) 01/10/2013   Pain in joint, upper arm 11/08/2015   Preventative health care 11/29/2013   Sleep apnea    c pap   Swelling of both lower extremities    TOS (thoracic outlet syndrome) 11/20/2015   Vaginitis and vulvovaginitis 12/23/2014   Vitamin D deficiency    Past Surgical History:  Past Surgical History:  Procedure Laterality Date   BREAST REDUCTION SURGERY  2001   CEREBRAL MICROVASCULAR DECOMPRESSION  2006   back of her head   CHOLECYSTECTOMY     COLONOSCOPY     GALLBLADDER SURGERY  1991   gall stone removed   MOLE REMOVAL     POLYPECTOMY     REDUCTION MAMMAPLASTY Bilateral 2001   Social History:  reports that she has never smoked. She has never used smokeless tobacco. She reports current alcohol use. She reports that she does not use drugs. Family History:  Family History  Problem Relation Age of Onset   Heart disease Maternal Grandmother  MI   Stroke Maternal Grandmother    Diabetes Maternal Grandmother    Arrhythmia Mother    Diabetes Mother        maternal grandmother and aunts   Hypertension Mother    Colon polyps Mother    Heart attack Father    Stroke Father    Hyperlipidemia Father    Hypertension Father    Cancer Father        lung, smoker   Heart disease Father        CABG x 4   Alcoholism Father    Prostate cancer Maternal Grandfather    Cancer Maternal Grandfather        colon cancer, prostate   Colon cancer Maternal Grandfather    Alcohol abuse Paternal Grandmother        died of pneumonia   Heart disease Paternal Grandfather        MI    Asthma Daughter        allergies   Hypertension Other        parents and maternal parents   Breast cancer Neg Hx    Esophageal cancer Neg Hx    Rectal cancer Neg Hx    Stomach cancer Neg Hx      HOME MEDICATIONS: Allergies as of 05/09/2021   No Known Allergies      Medication List        Accurate as of May 08, 2021  2:22 PM. If you have any questions, ask your nurse or doctor.          atorvastatin 20 MG tablet Commonly known as: LIPITOR Take 1 tablet (20 mg total) by mouth daily.   azelastine 0.05 % ophthalmic solution Commonly known as: OPTIVAR Place 1 drop into both eyes 2 (two) times daily.   BENFOTIAMINE PO Take 300 mg by mouth daily.   BIOTIN PO Take by mouth.   CULTURELLE PO Take by mouth.   dapagliflozin propanediol 5 MG Tabs tablet Commonly known as: Farxiga Take 1 tablet (5 mg total) by mouth daily before breakfast.   diphenoxylate-atropine 2.5-0.025 MG tablet Commonly known as: LOMOTIL TAKE 1 TABLET BY MOUTH 4 TIMES DAILY AS NEEDED FOR DIARRHEA OR  LOOSE  STOOLS   ELDERBERRY PO Take 1 tablet by mouth daily.   gabapentin 300 MG capsule Commonly known as: NEURONTIN Take 1 capsule (300 mg total) by mouth 3 (three) times daily as needed.   glipiZIDE 5 MG tablet Commonly known as: GLUCOTROL Take 1 tablet (5 mg total) by mouth 2 (two) times daily before a meal.   hydrochlorothiazide 25 MG tablet Commonly known as: HYDRODIURIL Take 1 tablet (25 mg total) by mouth daily.   hyoscyamine 0.125 MG SL tablet Commonly known as: LEVSIN SL Place 1 tablet (0.125 mg total) under the tongue every 4 (four) hours as needed.   levonorgestrel 20 MCG/24HR IUD Commonly known as: MIRENA 1 each by Intrauterine route once.   metFORMIN 500 MG 24 hr tablet Commonly known as: GLUCOPHAGE-XR Take 1 tablet by mouth once daily with breakfast   metoprolol succinate 50 MG 24 hr tablet Commonly known as: TOPROL-XL TAKE 1 TABLET BY MOUTH ONCE DAILY WITH FOOD OR   IMMEDIATELY  FOLLOWING  A  MEAL   Multivitamin Adults Tabs Take by mouth daily.   naproxen 375 MG tablet Commonly known as: NAPROSYN Take 1 tablet (375 mg total) by mouth 2 (two) times daily with a meal.   UNABLE TO FIND Take 2 tablets by mouth  daily. Med Name: [facility]   VITAMIN C PO Take by mouth daily.   Vitamin D (Ergocalciferol) 1.25 MG (50000 UNIT) Caps capsule Commonly known as: DRISDOL TAKE 1 CAPSULE BY MOUTH ONCE A WEEK   zinc gluconate 50 MG tablet Take 50 mg by mouth daily.         OBJECTIVE:   Vital Signs: There were no vitals taken for this visit.  Wt Readings from Last 3 Encounters:  03/31/21 238 lb 6.4 oz (108.1 kg)  01/24/21 241 lb (109.3 kg)  01/13/21 242 lb 12.8 oz (110.1 kg)     Exam: General: Pt appears well and is in NAD  Neck: General: Supple without adenopathy. Thyroid: Thyroid size normal.  No goiter or nodules appreciated. No thyroid bruit.  Lungs: Clear with good BS bilat with no rales, rhonchi, or wheezes  Heart: RRR with normal S1 and S2 and no gallops; no murmurs; no rub  Abdomen: Normoactive bowel sounds, soft, nontender, without masses or organomegaly palpable  Extremities: No pretibial edema.   Neuro: MS is good with appropriate affect, pt is alert and Ox3    DM foot exam: 01/24/2021   The skin of the feet is intact without sores or ulcerations. The pedal pulses are 2+ on right and 2+ on left. The sensation is intact to a screening 5.07, 10 gram monofilament bilaterally      DATA REVIEWED:  Lab Results  Component Value Date   HGBA1C 9.9 (H) 01/02/2021   HGBA1C 9.2 (H) 08/08/2020   HGBA1C 9.6 (H) 04/18/2020   Lab Results  Component Value Date   MICROALBUR 3.7 (H) 01/02/2021   LDLCALC 98 08/08/2020   CREATININE 0.83 01/02/2021   Lab Results  Component Value Date   MICRALBCREAT 3.0 01/02/2021     Lab Results  Component Value Date   CHOL 162 01/02/2021   HDL 32.80 (L) 01/02/2021   LDLCALC 98 08/08/2020    LDLDIRECT 122.0 01/02/2021   TRIG 234.0 (H) 01/02/2021   CHOLHDL 5 01/02/2021         ASSESSMENT / PLAN / RECOMMENDATIONS:   1) Type 2 Diabetes Mellitus, ***controlled, With *** complications - Most recent A1c of *** %. Goal A1c < 7.0 %.    Plan: MEDICATIONS: ***  EDUCATION / INSTRUCTIONS: BG monitoring instructions: Patient is instructed to check her blood sugars *** times a day, ***. Call [facility] if: BG persistently < 70 or > 300. I reviewed the Rule of 15 for the treatment of hypoglycemia in detail with the patient. Literature supplied.  REFERRALS: ***.   2) Diabetic complications:  Eye: Does *** have known diabetic retinopathy.  Neuro/ Feet: Does *** have known diabetic peripheral neuropathy .  Renal: Patient does *** have known baseline CKD. She   is *** on an ACEI/ARB at present. Check urine albumin/creatinine ratio yearly starting at time of diagnosis. If albuminuria is positive, treatment is geared toward better glucose, blood pressure control and use of ACE inhibitors or ARBs. Monitor electrolytes and creatinine once to twice yearly.   3) Lipids: Patient is *** on a statin.  4) Hypertension: *** at goal of < 140/90 mmHg.    F/U in ***    Signed electronically by: Lyndle Herrlich, MD  [facility]  [facility] [address] [place] [place], [place] [address] Phone: [phone] FAX: [phone]   CC: [name], MD [address] [place] [address] Phone: [phone]  Fax: [phone]  Return to Endocrinology clinic as below: Future Appointments  Date Time Provider Department Center  05/09/2021  8:10 AM Dedric Ethington, Konrad Dolores, MD [facility]  05/12/2021  4:30 PM [facility] HOME SLEEP STUDY [facility] None  08/29/2021  9:00 AM [name], MD [facility]

## 2021-05-09 ENCOUNTER — Ambulatory Visit: Payer: 59 | Admitting: Internal Medicine

## 2021-05-12 ENCOUNTER — Other Ambulatory Visit: Payer: Self-pay | Admitting: Family Medicine

## 2021-07-26 ENCOUNTER — Ambulatory Visit: Payer: 59

## 2021-07-26 ENCOUNTER — Other Ambulatory Visit: Payer: Self-pay

## 2021-07-26 DIAGNOSIS — G4733 Obstructive sleep apnea (adult) (pediatric): Secondary | ICD-10-CM

## 2021-07-31 ENCOUNTER — Telehealth: Payer: Self-pay | Admitting: Pulmonary Disease

## 2021-07-31 DIAGNOSIS — G4733 Obstructive sleep apnea (adult) (pediatric): Secondary | ICD-10-CM | POA: Diagnosis not present

## 2021-07-31 NOTE — Telephone Encounter (Signed)
HST 07/26/21 >> AHI 21.4, SpO2 low 77%   Please let her know her sleep study shows moderate obstructive sleep apnea.  I have sent order to get her set up with a DME and arrange for new CPAP machine and supplies.  Please schedule ROV with me or NP in 4 to 5 months.

## 2021-08-01 NOTE — Telephone Encounter (Signed)
ATC patient x1 unable to leave VM due to mailbox being full

## 2021-08-03 NOTE — Telephone Encounter (Signed)
Called and spoke with patient to go over sleep study results. She expressed understanding. I told her to be on the look out for a call from [employer] and also provided her with number. Recall placed for follow up. Nothing further needed at this time.

## 2021-08-29 ENCOUNTER — Other Ambulatory Visit: Payer: Self-pay

## 2021-08-29 ENCOUNTER — Telehealth: Payer: Self-pay | Admitting: Family Medicine

## 2021-08-29 ENCOUNTER — Encounter: Payer: 59 | Admitting: Family Medicine

## 2021-08-29 DIAGNOSIS — I10 Essential (primary) hypertension: Secondary | ICD-10-CM

## 2021-08-29 MED ORDER — METOPROLOL SUCCINATE ER 50 MG PO TB24: ORAL_TABLET | ORAL | 1 refills | Status: DC

## 2021-08-29 MED ORDER — DAPAGLIFLOZIN PROPANEDIOL 5 MG PO TABS: 5.0000 mg | ORAL_TABLET | Freq: Every day | ORAL | 6 refills | Status: DC

## 2021-08-29 NOTE — Telephone Encounter (Signed)
Pt was scheduled to for a cpe today 12/13 however provider out of the office. 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   Medication:  metoprolol succinate (TOPROL-XL) 50 MG 24 hr tablet [[id number]]      Has the patient contacted their pharmacy? No. (If no, request that the patient contact the pharmacy for the refill.) (If yes, when and what did the pharmacy advise?)     Preferred Pharmacy (with phone number or street name):  [facility] [address] [place], [place] - [address]  [address] [place] [address]  Phone:  [phone]  Fax:  [phone]     Agent: Please be advised that RX refills may take up to 3 business days. We ask that you follow-up with your pharmacy.

## 2021-08-29 NOTE — Telephone Encounter (Signed)
Medication sent.

## 2021-08-31 ENCOUNTER — Other Ambulatory Visit: Payer: Self-pay | Admitting: Internal Medicine

## 2021-08-31 ENCOUNTER — Telehealth: Payer: Self-pay | Admitting: Family Medicine

## 2021-08-31 NOTE — Telephone Encounter (Signed)
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 Now that she is on her way back to [place] she would like to make sure they will be able to give her the refill. Please advise.   Medication: glipiZIDE (GLUCOTROL) 5 MG tablet   Has the patient contacted their pharmacy? Yes.    Preferred Pharmacy: [facility] [address] [place], [place] - [address]  [address] [place] [address]  Phone:  [phone]  Fax:  [phone]

## 2021-09-01 NOTE — Telephone Encounter (Signed)
sent 

## 2021-09-19 ENCOUNTER — Encounter: Payer: 59 | Admitting: Family

## 2021-09-27 ENCOUNTER — Ambulatory Visit: Payer: Self-pay | Admitting: Family

## 2021-10-31 ENCOUNTER — Other Ambulatory Visit: Payer: Self-pay

## 2021-10-31 ENCOUNTER — Telehealth: Payer: Self-pay | Admitting: Family Medicine

## 2021-10-31 MED ORDER — METFORMIN HCL ER 500 MG PO TB24: 500.0000 mg | ORAL_TABLET | Freq: Every day | ORAL | 1 refills | Status: DC

## 2021-10-31 NOTE — Telephone Encounter (Signed)
Medication sent.

## 2021-10-31 NOTE — Telephone Encounter (Signed)
Medication: metFORMIN (GLUCOPHAGE-XR) 500 MG 24 hr tablet   Has the patient contacted their pharmacy? Yes.     Preferred Pharmacy: [facility] [address] [place], [place] [phone]

## 2021-12-04 ENCOUNTER — Other Ambulatory Visit: Payer: Self-pay | Admitting: Internal Medicine

## 2022-02-07 ENCOUNTER — Other Ambulatory Visit: Payer: Self-pay

## 2022-02-07 MED ORDER — GLIPIZIDE 5 MG PO TABS: 5.0000 mg | ORAL_TABLET | Freq: Two times a day (BID) | ORAL | 0 refills | Status: DC

## 2022-02-07 NOTE — Telephone Encounter (Signed)
I have patient rescheduled with [facility] for a CPE in July..... Can you help her out with medication question?

## 2022-02-09 NOTE — Telephone Encounter (Signed)
Glipizide refill sent on 5/24

## 2022-03-19 ENCOUNTER — Encounter: Payer: Self-pay | Admitting: Family Medicine

## 2022-03-25 ENCOUNTER — Other Ambulatory Visit: Payer: Self-pay | Admitting: Family Medicine

## 2022-03-25 DIAGNOSIS — I10 Essential (primary) hypertension: Secondary | ICD-10-CM

## 2022-04-02 ENCOUNTER — Other Ambulatory Visit: Payer: Self-pay | Admitting: Family Medicine

## 2022-04-05 ENCOUNTER — Ambulatory Visit (INDEPENDENT_AMBULATORY_CARE_PROVIDER_SITE_OTHER): Payer: Self-pay | Admitting: Family Medicine

## 2022-04-05 ENCOUNTER — Encounter: Payer: Self-pay | Admitting: Family Medicine

## 2022-04-05 ENCOUNTER — Telehealth (HOSPITAL_BASED_OUTPATIENT_CLINIC_OR_DEPARTMENT_OTHER): Payer: Self-pay

## 2022-04-05 VITALS — BP 135/82 | HR 99 | Ht 65.0 in | Wt 244.6 lb

## 2022-04-05 DIAGNOSIS — E1165 Type 2 diabetes mellitus with hyperglycemia: Secondary | ICD-10-CM

## 2022-04-05 DIAGNOSIS — Z0000 Encounter for general adult medical examination without abnormal findings: Secondary | ICD-10-CM

## 2022-04-05 DIAGNOSIS — E782 Mixed hyperlipidemia: Secondary | ICD-10-CM

## 2022-04-05 DIAGNOSIS — Z1231 Encounter for screening mammogram for malignant neoplasm of breast: Secondary | ICD-10-CM

## 2022-04-05 DIAGNOSIS — I10 Essential (primary) hypertension: Secondary | ICD-10-CM

## 2022-04-05 LAB — CBC
HCT: 40.2 % (ref 36.0–46.0)
Hemoglobin: 13.3 g/dL (ref 12.0–15.0)
MCHC: 33 g/dL (ref 30.0–36.0)
MCV: 94.6 fl (ref 78.0–100.0)
Platelets: 307 10*3/uL (ref 150.0–400.0)
RBC: 4.25 Mil/uL (ref 3.87–5.11)
RDW: 13.7 % (ref 11.5–15.5)
WBC: 6.6 10*3/uL (ref 4.0–10.5)

## 2022-04-05 LAB — COMPREHENSIVE METABOLIC PANEL
ALT: 21 U/L (ref 0–35)
AST: 15 U/L (ref 0–37)
Albumin: 4.2 g/dL (ref 3.5–5.2)
Alkaline Phosphatase: 58 U/L (ref 39–117)
BUN: 10 mg/dL (ref 6–23)
CO2: 26 mEq/L (ref 19–32)
Calcium: 9.5 mg/dL (ref 8.4–10.5)
Chloride: 102 mEq/L (ref 96–112)
Creatinine, Ser: 0.82 mg/dL (ref 0.40–1.20)
GFR: 82.59 mL/min (ref >60.00)
Glucose, Bld: 248 mg/dL — ABNORMAL HIGH (ref 70–99)
Potassium: 4.4 mEq/L (ref 3.5–5.1)
Sodium: 139 mEq/L (ref 135–145)
Total Bilirubin: 0.3 mg/dL (ref 0.2–1.2)
Total Protein: 6.8 g/dL (ref 6.0–8.3)

## 2022-04-05 LAB — LIPID PANEL
Cholesterol: 220 mg/dL — ABNORMAL HIGH (ref 0–200)
HDL: 42.1 mg/dL (ref >39.00)
LDL Cholesterol: 146 mg/dL — ABNORMAL HIGH (ref 0–99)
NonHDL: 177.68
Total CHOL/HDL Ratio: 5
Triglycerides: 156 mg/dL — ABNORMAL HIGH (ref 0.0–149.0)
VLDL: 31.2 mg/dL (ref 0.0–40.0)

## 2022-04-05 LAB — MICROALBUMIN / CREATININE URINE RATIO
Creatinine,U: 137.8 mg/dL
Microalb Creat Ratio: 4.3 mg/g (ref 0.0–30.0)
Microalb, Ur: 6 mg/dL — ABNORMAL HIGH (ref 0.0–1.9)

## 2022-04-05 LAB — HEMOGLOBIN A1C: Hgb A1c MFr Bld: 9.6 % — ABNORMAL HIGH (ref 4.6–6.5)

## 2022-04-05 LAB — TSH: TSH: 1.75 u[IU]/mL (ref 0.35–5.50)

## 2022-04-05 MED ORDER — METFORMIN HCL ER 500 MG PO TB24: 500.0000 mg | ORAL_TABLET | Freq: Every day | ORAL | 1 refills | Status: AC

## 2022-04-05 MED ORDER — ATORVASTATIN CALCIUM 20 MG PO TABS: 20.0000 mg | ORAL_TABLET | Freq: Every day | ORAL | 3 refills | Status: DC

## 2022-04-05 NOTE — Assessment & Plan Note (Signed)
No recent A1c. Checking today. She is going to check with insurance about possible costs for Ozempic and let us know.

## 2022-04-05 NOTE — Progress Notes (Signed)
Complete physical exam  Patient: Chelsea Bray   DOB: [birth year]   51 y.o. Female  MRN: [id number]  Subjective:    CC: CPE    Chelsea Bray is a 51 y.o. female who presents today for a complete physical exam. She reports consuming a  low-carb  diet. Home exercise routine includes walking. She generally feels well. She reports sleeping well. She does not have additional problems to discuss today.    DM: home readings vary widely; currently on glipizide and metformin; she has not been taking [place] due to cost. She would like to try Ozempic - she is going to check with her insurance to see about preferred medications/coverage and will let us know.   HTN: usually controlled. Ran out of meds a few days ago.      Most recent fall risk assessment:    04/05/2022   12:23 PM  [place] in the past year? 0  Number falls in past yr: 0  Injury with Fall? 0     Most recent depression screenings:    04/05/2022   12:23 PM 01/02/2021    3:00 PM  PHQ 2/9 Scores  PHQ - 2 Score 0 0    Vision:Within last year and Dental: No current dental problems and No regular dental care     Patient Care Team: Mosie Lukes, MD as PCP - General (Family Medicine)   Outpatient Medications Prior to Visit  Medication Sig   Ascorbic Acid (VITAMIN C PO) Take by mouth daily.   azelastine (OPTIVAR) 0.05 % ophthalmic solution Place 1 drop into both eyes 2 (two) times daily.   BENFOTIAMINE PO Take 300 mg by mouth daily.   BIOTIN PO Take by mouth.   dapagliflozin propanediol (FARXIGA) 5 MG TABS tablet Take 1 tablet (5 mg total) by mouth daily before breakfast.   diphenoxylate-atropine (LOMOTIL) 2.5-0.025 MG tablet TAKE 1 TABLET BY MOUTH 4 TIMES DAILY AS NEEDED FOR DIARRHEA OR  LOOSE  STOOLS   ELDERBERRY PO Take 1 tablet by mouth daily.   glipiZIDE (GLUCOTROL) 5 MG tablet TAKE 1 TABLET BY MOUTH TWICE DAILY BEFORE A MEAL   hyoscyamine (LEVSIN SL) 0.125 MG SL tablet Place 1 tablet (0.125  mg total) under the tongue every 4 (four) hours as needed.   Lactobacillus Rhamnosus, GG, (CULTURELLE PO) Take by mouth.   levonorgestrel (MIRENA) 20 MCG/24HR IUD 1 each by Intrauterine route once.   metoprolol succinate (TOPROL-XL) 50 MG 24 hr tablet TAKE 1 TABLET BY MOUTH ONCE DAILY WITH FOOD OR IMMEDIATELY FOLLOWING A MEAL   Multiple Vitamins-Minerals (MULTIVITAMIN ADULTS) TABS Take by mouth daily.   naproxen (NAPROSYN) 375 MG tablet Take 1 tablet (375 mg total) by mouth 2 (two) times daily with a meal.   UNABLE TO FIND Take 2 tablets by mouth daily. Med Name: [facility]   Vitamin D, Ergocalciferol, (DRISDOL) 1.25 MG (50000 UNIT) CAPS capsule TAKE 1 CAPSULE BY MOUTH ONCE A WEEK   zinc gluconate 50 MG tablet Take 50 mg by mouth daily.   [DISCONTINUED] atorvastatin (LIPITOR) 20 MG tablet Take 1 tablet (20 mg total) by mouth daily.   [DISCONTINUED] gabapentin (NEURONTIN) 300 MG capsule Take 1 capsule (300 mg total) by mouth 3 (three) times daily as needed.   [DISCONTINUED] hydrochlorothiazide (HYDRODIURIL) 25 MG tablet Take 1 tablet (25 mg total) by mouth daily.   [DISCONTINUED] metFORMIN (GLUCOPHAGE-XR) 500 MG 24 hr tablet Take 1 tablet (500 mg total) by mouth  daily with breakfast.   No facility-administered medications prior to visit.    ROS All review of systems negative except what is listed in the HPI        Objective:     BP 135/82   Pulse 99   Ht 5\' 5"  (1.651 m)   Wt 244 lb 9.6 oz (110.9 kg)   BMI 40.70 kg/m    Physical Exam Vitals reviewed.  Constitutional:      General: She is not in acute distress.    Appearance: Normal appearance. She is obese. She is not ill-appearing.  HENT:     Head: Normocephalic and atraumatic.     Right Ear: Tympanic membrane normal.     Left Ear: Tympanic membrane normal.     Nose: Nose normal.     Mouth/Throat:     Mouth: Mucous membranes are moist.     Pharynx: Oropharynx is clear.  Eyes:     Extraocular Movements: Extraocular  movements intact.     Conjunctiva/sclera: Conjunctivae normal.     Pupils: Pupils are equal, round, and reactive to light.  Neck:     Vascular: No carotid bruit.  Cardiovascular:     Rate and Rhythm: Normal rate and regular rhythm.     Pulses: Normal pulses.     Heart sounds: Normal heart sounds.  Pulmonary:     Effort: Pulmonary effort is normal.     Breath sounds: Normal breath sounds.  Abdominal:     General: Abdomen is flat. Bowel sounds are normal. There is no distension.     Palpations: Abdomen is soft. There is no mass.     Tenderness: There is no abdominal tenderness. There is no right CVA tenderness, left CVA tenderness, guarding or rebound.  Genitourinary:    Comments: Deferred exam Musculoskeletal:        General: Normal range of motion.     Cervical back: Normal range of motion and neck supple. No tenderness.     Right lower leg: No edema.     Left lower leg: No edema.  Lymphadenopathy:     Cervical: No cervical adenopathy.  Skin:    General: Skin is warm and dry.     Capillary Refill: Capillary refill takes less than 2 seconds.  Neurological:     General: No focal deficit present.     Mental Status: She is alert and oriented to person, place, and time. Mental status is at baseline.  Psychiatric:        Mood and Affect: Mood normal.        Behavior: Behavior normal.        Thought Content: Thought content normal.        Judgment: Judgment normal.      No results found for any visits on 04/05/22.     Assessment & Plan:    Routine Health Maintenance and Physical Exam  Immunization History  Administered Date(s) Administered   Influenza Split 06/13/2012   Influenza,inj,Quad PF,6+ Mos 06/27/2016, 05/06/2018, 08/08/2020   PFIZER(Purple Top)SARS-COV-2 Vaccination 01/06/2020, 02/08/2020   Pneumococcal Conjugate-13 01/12/2016, 08/08/2020   Tdap 02/28/2004, 11/23/2013    Health Maintenance  Topic Date Due   FOOT EXAM  01/11/2017   OPHTHALMOLOGY EXAM   05/15/2019   COVID-19 Vaccine (3 - Pfizer series) 04/04/2020   Zoster Vaccines- Shingrix (1 of 2) Never done   HEMOGLOBIN A1C  07/04/2021   URINE MICROALBUMIN  01/02/2022   MAMMOGRAM  01/24/2022   INFLUENZA VACCINE  04/17/2022  PAP SMEAR-Modifier  10/15/2022   TETANUS/TDAP  11/24/2023   COLONOSCOPY (Pts 45-68yrs Insurance coverage will need to be confirmed)  12/01/2029   Hepatitis C Screening  Completed   HIV Screening  Completed   HPV VACCINES  Aged Out    Discussed health benefits of physical activity, and encouraged her to engage in regular exercise appropriate for her age and condition.  Problem List Items Addressed This Visit       Cardiovascular and Mediastinum   Essential hypertension    Blood pressure is at goal for age and co-morbidities.  I recommend continue metoprolol.   - BP goal <130/80 - monitor and log blood pressures at home - check around the same time each day in a relaxed setting - Limit salt to <2000 mg/day - Follow DASH eating plan (heart healthy diet) - limit alcohol to 2 standard drinks per day for men and 1 per day for women - avoid tobacco products - get at least 2 hours of regular aerobic exercise weekly Patient aware of signs/symptoms requiring further/urgent evaluation. Labs updated today.       Relevant Medications   atorvastatin (LIPITOR) 20 MG tablet   Other Relevant Orders   CBC   Comprehensive metabolic panel   Lipid panel   TSH     Endocrine   Type 2 diabetes mellitus with hyperglycemia, without long-term current use of insulin (HCC)    No recent A1c. Checking today. She is going to check with insurance about possible costs for Ozempic and let us know.       Relevant Medications   atorvastatin (LIPITOR) 20 MG tablet   metFORMIN (GLUCOPHAGE-XR) 500 MG 24 hr tablet   Other Relevant Orders   Microalbumin / creatinine urine ratio   Hemoglobin A1c   Comprehensive metabolic panel   Lipid panel   TSH     Other   Hyperlipidemia,  mixed    Continue Lipitor and lifestyle measures. Labs today       Relevant Medications   atorvastatin (LIPITOR) 20 MG tablet   Other Visit Diagnoses     Annual physical exam    -  Primary   Relevant Orders   Microalbumin / creatinine urine ratio   Hemoglobin A1c   CBC   Comprehensive metabolic panel   Lipid panel   TSH   MM DIGITAL SCREENING BILATERAL   Encounter for screening mammogram for malignant neoplasm of breast       Relevant Orders   MM DIGITAL SCREENING BILATERAL      Return in about 3 months (around 07/06/2022) for routine f/u PCP 3-4 months.     Clayborne Dana, NP

## 2022-04-05 NOTE — Assessment & Plan Note (Signed)
Continue Lipitor and lifestyle measures. Labs today

## 2022-04-05 NOTE — Assessment & Plan Note (Signed)
Blood pressure is at goal for age and co-morbidities.  I recommend continue metoprolol.   - BP goal <130/80 - monitor and log blood pressures at home - check around the same time each day in a relaxed setting - Limit salt to <2000 mg/day - Follow DASH eating plan (heart healthy diet) - limit alcohol to 2 standard drinks per day for men and 1 per day for women - avoid tobacco products - get at least 2 hours of regular aerobic exercise weekly Patient aware of signs/symptoms requiring further/urgent evaluation. Labs updated today.

## 2022-04-06 ENCOUNTER — Other Ambulatory Visit: Payer: Self-pay | Admitting: Family Medicine

## 2022-04-06 DIAGNOSIS — E1169 Type 2 diabetes mellitus with other specified complication: Secondary | ICD-10-CM

## 2022-04-06 DIAGNOSIS — E782 Mixed hyperlipidemia: Secondary | ICD-10-CM

## 2022-04-06 DIAGNOSIS — E669 Obesity, unspecified: Secondary | ICD-10-CM

## 2022-04-06 MED ORDER — ATORVASTATIN CALCIUM 40 MG PO TABS: 40.0000 mg | ORAL_TABLET | Freq: Every day | ORAL | 1 refills | Status: AC

## 2022-04-06 MED ORDER — LISINOPRIL 10 MG PO TABS: 10.0000 mg | ORAL_TABLET | Freq: Every day | ORAL | 3 refills | Status: AC

## 2022-04-06 NOTE — Progress Notes (Signed)
-  Your A1c is still elevated. Let us know your thoughts on Ozempic like we discussed. We definitely need to tweak your regimen to get better control.  -Your cholesterol is still elevated. I'm going to increase you to 40 mg of Lipitor. If you've already picked up your prescription, you can take 2 of your 20 mg tabs until you run out and need the new prescription.  -Microalbumin (measuring your kidney health) is slightly worse. I'm going to add a low dose medication (lisinopril) to help protect your kidneys. We will need to recheck this level in the next month or two - please schedule a follow-up.  -Other labs are stable!    The 10-year ASCVD risk score (Arnett DK, et al., 2019) is: 16%   Values used to calculate the score:     Age: 51 years     Sex: Female     Is Non-Hispanic African American: Yes     Diabetic: Yes     Tobacco smoker: No     Systolic Blood Pressure: 135 mmHg     Is BP treated: Yes     HDL Cholesterol: 42.1 mg/dL     Total Cholesterol: 220 mg/dL

## 2022-04-10 ENCOUNTER — Telehealth: Payer: Self-pay | Admitting: Family Medicine

## 2022-04-10 DIAGNOSIS — E1165 Type 2 diabetes mellitus with hyperglycemia: Secondary | ICD-10-CM

## 2022-04-10 MED ORDER — OZEMPIC (0.25 OR 0.5 MG/DOSE) 2 MG/3ML ~~LOC~~ SOPN: 0.2500 mg | PEN_INJECTOR | SUBCUTANEOUS | 1 refills | Status: AC

## 2022-04-10 NOTE — Telephone Encounter (Signed)
Looks like you saw her on 7/20. Was she considering one of these meds?

## 2022-04-10 NOTE — Telephone Encounter (Signed)
Patient called to advise that she reached out to her insurance and found out that Trulicity and Ozempic are both covered and they do not require a prior authorization.

## 2022-04-12 ENCOUNTER — Other Ambulatory Visit: Payer: Self-pay

## 2022-04-12 MED ORDER — DAPAGLIFLOZIN PROPANEDIOL 5 MG PO TABS: 5.0000 mg | ORAL_TABLET | Freq: Every day | ORAL | 6 refills | Status: AC

## 2022-04-12 NOTE — Telephone Encounter (Signed)
Pt stated ozempic is too expensive and she would like to go back on farxiga. She also stated she is hesitant to take lisinopril because it may effect her kidneys.

## 2022-04-12 NOTE — Telephone Encounter (Signed)
Farxiga refill sent

## 2022-04-25 ENCOUNTER — Encounter (INDEPENDENT_AMBULATORY_CARE_PROVIDER_SITE_OTHER): Payer: Self-pay

## 2022-07-13 ENCOUNTER — Telehealth: Payer: Self-pay

## 2022-07-13 NOTE — Telephone Encounter (Signed)
FYI:   Pt called stating that she is having to switch providers due to insurance. She will try to return when she has better insurance.

## 2022-07-16 ENCOUNTER — Ambulatory Visit: Payer: Self-pay | Admitting: Family Medicine

## 2023-03-26 ENCOUNTER — Other Ambulatory Visit: Payer: Self-pay | Admitting: Family Medicine

## 2023-03-26 DIAGNOSIS — E1165 Type 2 diabetes mellitus with hyperglycemia: Secondary | ICD-10-CM

## 2023-04-25 ENCOUNTER — Other Ambulatory Visit: Payer: Self-pay | Admitting: Family Medicine

## 2023-04-25 DIAGNOSIS — E1165 Type 2 diabetes mellitus with hyperglycemia: Secondary | ICD-10-CM

## 2023-04-29 ENCOUNTER — Other Ambulatory Visit: Payer: Self-pay | Admitting: Family Medicine

## 2023-04-29 DIAGNOSIS — E1165 Type 2 diabetes mellitus with hyperglycemia: Secondary | ICD-10-CM
